# Patient Record
Sex: Male | Born: 1955 | Race: White | Hispanic: No | Smoking: Current every day smoker
Health system: Southern US, Community
[De-identification: ages and names within clinical notes are randomized; demographics above are authoritative.]

## PROBLEM LIST (undated history)

## (undated) DIAGNOSIS — T4145XA Adverse effect of unspecified anesthetic, initial encounter: Secondary | ICD-10-CM

## (undated) DIAGNOSIS — R0600 Dyspnea, unspecified: Secondary | ICD-10-CM

## (undated) DIAGNOSIS — F1020 Alcohol dependence, uncomplicated: Secondary | ICD-10-CM

## (undated) DIAGNOSIS — K219 Gastro-esophageal reflux disease without esophagitis: Secondary | ICD-10-CM

## (undated) DIAGNOSIS — IMO0001 Reserved for inherently not codable concepts without codable children: Secondary | ICD-10-CM

## (undated) DIAGNOSIS — T8859XA Other complications of anesthesia, initial encounter: Secondary | ICD-10-CM

## (undated) HISTORY — DX: Gastro-esophageal reflux disease without esophagitis: K21.9

## (undated) HISTORY — PX: BUNIONECTOMY: SHX129

---

## 2003-01-30 ENCOUNTER — Emergency Department (HOSPITAL_COMMUNITY): Admission: EM | Admit: 2003-01-30 | Discharge: 2003-01-30 | Payer: Self-pay | Admitting: Emergency Medicine

## 2003-11-14 ENCOUNTER — Emergency Department (HOSPITAL_COMMUNITY): Admission: EM | Admit: 2003-11-14 | Discharge: 2003-11-14 | Payer: Self-pay | Admitting: Emergency Medicine

## 2005-03-08 ENCOUNTER — Emergency Department (HOSPITAL_COMMUNITY): Admission: EM | Admit: 2005-03-08 | Discharge: 2005-03-09 | Payer: Self-pay | Admitting: Emergency Medicine

## 2005-03-08 ENCOUNTER — Emergency Department (HOSPITAL_COMMUNITY): Admission: EM | Admit: 2005-03-08 | Discharge: 2005-03-08 | Payer: Self-pay | Admitting: Emergency Medicine

## 2005-03-09 ENCOUNTER — Observation Stay (HOSPITAL_COMMUNITY): Admission: AD | Admit: 2005-03-09 | Discharge: 2005-03-10 | Payer: Self-pay | Admitting: Internal Medicine

## 2005-03-10 ENCOUNTER — Observation Stay (HOSPITAL_COMMUNITY): Admission: EM | Admit: 2005-03-10 | Discharge: 2005-03-12 | Payer: Self-pay | Admitting: Emergency Medicine

## 2005-11-27 ENCOUNTER — Ambulatory Visit (HOSPITAL_COMMUNITY): Admission: RE | Admit: 2005-11-27 | Discharge: 2005-11-27 | Payer: Self-pay | Admitting: Internal Medicine

## 2005-12-02 ENCOUNTER — Ambulatory Visit (HOSPITAL_COMMUNITY): Admission: RE | Admit: 2005-12-02 | Discharge: 2005-12-02 | Payer: Self-pay | Admitting: Internal Medicine

## 2006-03-05 ENCOUNTER — Emergency Department (HOSPITAL_COMMUNITY): Admission: EM | Admit: 2006-03-05 | Discharge: 2006-03-05 | Payer: Self-pay | Admitting: Emergency Medicine

## 2006-05-19 ENCOUNTER — Ambulatory Visit (HOSPITAL_COMMUNITY): Admission: RE | Admit: 2006-05-19 | Discharge: 2006-05-19 | Payer: Self-pay | Admitting: Podiatry

## 2007-03-12 ENCOUNTER — Emergency Department (HOSPITAL_COMMUNITY): Admission: EM | Admit: 2007-03-12 | Discharge: 2007-03-13 | Payer: Self-pay | Admitting: Emergency Medicine

## 2007-04-28 ENCOUNTER — Emergency Department (HOSPITAL_COMMUNITY): Admission: EM | Admit: 2007-04-28 | Discharge: 2007-04-29 | Payer: Self-pay | Admitting: Emergency Medicine

## 2008-02-15 ENCOUNTER — Inpatient Hospital Stay (HOSPITAL_COMMUNITY): Admission: EM | Admit: 2008-02-15 | Discharge: 2008-02-27 | Payer: Self-pay | Admitting: Emergency Medicine

## 2008-05-05 ENCOUNTER — Emergency Department (HOSPITAL_COMMUNITY): Admission: EM | Admit: 2008-05-05 | Discharge: 2008-05-05 | Payer: Self-pay | Admitting: Emergency Medicine

## 2010-09-21 ENCOUNTER — Encounter: Payer: Self-pay | Admitting: Neurosurgery

## 2011-01-13 NOTE — Discharge Summary (Signed)
NAME:  Douglas Stout, Douglas Stout NO.:  [id number]   MEDICAL RECORD NO.:  [id number]          PATIENT TYPE:  INP   LOCATION:  A318                          FACILITY:  [facility]   PHYSICIAN:  Kingsley Callander. Ouida Sills, MD       DATE OF BIRTH:  [birth year]   DATE OF ADMISSION:  02/15/2008  DATE OF DISCHARGE:  LH                               DISCHARGE SUMMARY   DISCHARGE DIAGNOSES:  1. Gastroenteritis.  2. Delirium/alcohol withdrawal.  3. Alcoholic hepatitis.  4. Thrombocytopenia.  5. Hyponatremia.  6. Hypomagnesemia.  7. Hypokalemia.  8. Hypocalcemia.  9. Right lower lobe pneumonia.   HOSPITAL COURSE:  This patient is a 55 year old white male who presented  with nausea, vomiting, and diarrhea.  Stool studies were obtained and  were negative.  He was febrile.  Blood cultures were negative.  He was  hypokalemic, hyponatremic, hypocalcemic, and hypomagnesemic.  He was  treated with IV fluids, supplemental potassium, and calcium.  He was  thrombocytopenic.  His platelet count normalized, however, over his  hospitalization as did his potassium and sodium levels.  He had elevated  liver enzymes consistent with alcoholic hepatitis.   He was treated with an alcohol detox regimen of thiamine, multivitamins,  magnesium, and Ativan.  He was seen in Neurology Consultation by Dr.  Gerilyn Pilgrim.  A CT of the head was a poor quality study.   He developed a right lower lobe pneumonia.  He was treated with Levaquin  and vancomycin.  There was concern that he had influenza and he was  treated with Tamiflu.   His GI symptoms had resolved.  He was treated empirically with PPI  therapy.   His mental status cleared.  He was able to eat and walk.  He was felt to  be stable for discharge on February 27, 2008.  He will be seen in followup  to my office in approximately 10 days.  He was advised to avoid all  alcohol.   DISCHARGE MEDICATIONS:  1. Prevacid 30 mg daily.  2. Augmentin 875 mg b.i.d. for 5 more days.  3. Vicodin q.4 h. p.r.n. for pain.  4. Multivitamin daily.      Kingsley Callander. Ouida Sills, MD  Electronically Signed     ROF/MEDQ  D:  02/27/2008  T:  02/27/2008  Job:  [id number]

## 2011-01-13 NOTE — Group Therapy Note (Signed)
NAME:  Douglas Stout, Douglas Stout NO.:  [id number]   MEDICAL RECORD NO.:  [id number]          PATIENT TYPE:  INP   LOCATION:  A318                          FACILITY:  [facility]   PHYSICIAN:  Kofi A. Gerilyn Pilgrim, M.D. DATE OF BIRTH:  05-23-1956   DATE OF PROCEDURE:  DATE OF DISCHARGE:                                 PROGRESS NOTE   Wife reports that he is more alert, he is less drowsy and is able to  feed himself today.   PHYSICAL EXAMINATION:  Temperature 98.1, pulse 84, respirations 22,  blood pressure 87/56.  The patient indeed is more alert and also responsive.  He is not dozing  off.  He follows commands well and speaks in simple, comprehensible  sentences.  Unfortunately, he is still grossly disoriented, essentially  only oriented to person.  He confabulates quite a bit.  No tremors are  noted.   EEG shows excessive spindling, likely due to benzodiazepine effect.   ASSESSMENT/PLAN:  Agitated, encephalopathy/delirium, likely due to  complication of alcohol.  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  We did discuss alcohol cessation with the wife and the  patient.  I will continue with the current care.      Kofi A. Gerilyn Pilgrim, M.D.  Electronically Signed     KAD/MEDQ  D:  02/23/2008  T:  02/23/2008  Job:  [id number]

## 2011-01-13 NOTE — Group Therapy Note (Signed)
NAME:  Douglas Stout, Douglas Stout NO.:  [id number]   MEDICAL RECORD NO.:  [id number]         PATIENT TYPE:  PINP   LOCATION:  A318                          FACILITY:  [facility]   PHYSICIAN:  Edward L. Juanetta Gosling, M.D.DATE OF BIRTH:  November 30, 1955   DATE OF PROCEDURE:  02/19/2008  DATE OF DISCHARGE:                                 PROGRESS NOTE   First for February 20, 2008, Mr. Radermacher is a little better.  He is still  confused.  He still agitated but not as agitated and he actually at  times he is more intelligible than he was earlier.  He has had no more  falls.  He has actually been out at the nurses' station so he is very  closely observed.   His exam shows his temperature is 97.2, pulse 91, respirations 20, blood  pressure 103/66, and O2 sat is 92% on 2 L.  His chest is clearer than it  was.  He looks more comfortable than he did, and he is a little more  alert and he is a little bit more oriented than before.   ASSESSMENT:  I think of course he has had nausea, vomiting, and diarrhea  that seems to have resolved.  He is having alcohol withdrawal delirium  tremens, and he has still got significant problem with that, but he does  seem to be slowly improving.   Plan is to continue with his treatments, continue DT protocol and  follow.      Edward L. Juanetta Gosling, M.D.  Electronically Signed     ELH/MEDQ  D:  02/20/2008  T:  02/20/2008  Job:  [id number]

## 2011-01-13 NOTE — Group Therapy Note (Signed)
NAME:  Douglas Stout, GORR NO.:  [id number]   MEDICAL RECORD NO.:  [id number]          PATIENT TYPE:  INP   LOCATION:  A318                          FACILITY:  [facility]   PHYSICIAN:  Edward L. Juanetta Gosling, M.D.DATE OF BIRTH:  02-Jul-1956   DATE OF PROCEDURE:  DATE OF DISCHARGE:                                 PROGRESS NOTE   Mr. Barish was admitted with GI symptoms but since then has developed  delirium tremens from alcohol withdrawal.  He has been quite agitated.  Last night, he spiked a temperature.  I went ahead and started him on  Tamiflu for influenza prophylaxis.  We are going to go ahead and check  influenza titer to see if he is having any problems with that.  He also,  however, had received some Reglan in an attempt to improve his GI  situation and he became agitated.  Although he has been agitated, I  suppose this may have represented something like a malignant neuroleptic  syndrome, and I have asked for neurology consultation.  He tried to get  out of the bed again last night and fell but refused the CT scan.  Neurologically, he appears to be intact now.  He is sleepy but not  agitated as he was yesterday.  His T-max was 103.1, pulse 107, and  respirations 22.  His blood pressure dropped into the 70s.  He is  receiving more IV fluids now.  His chest is relatively clear.  He has  some rhonchi.  His heart is regular.  Blood cultures were done last  night and are pending.  He had a CBC which showed a white count of  11,300 yesterday with only 30% neutrophils and 54% lymphocytes.  Metabolic profile yesterday still showed some changes with his bilirubin  being elevated.  His SGOT and SGPT being elevated but that came down  some.  His albumin was 2.2.  CT head which was done early this morning  shows chronic left maxillary sinusitis, some ethmoiditis, and no  fractures but there was a lot of motion.   ASSESSMENT:  He is still quite sick.  He is febrile.  We are treating  him for influenza.  We are treating him for pneumonia.  He had a CT.  He  had a chest x-ray done yesterday as well which showed some edema versus  infiltrates.  So, I am going to go ahead and treat him as if he has  pneumonia with Levaquin.  He is on vancomycin.  He has had blood  cultures.  I do not think we can get a sputum culture.  I am going to  increase his IV fluids.  I am going to get a consultation with the  neurology team, but he is obviously quite sick.  I have discussed this  at length with his wife today to get her up-to-date with his  situation.      Edward L. Juanetta Gosling, M.D.  Electronically Signed     ELH/MEDQ  D:  02/22/2008  T:  02/23/2008  Job:  [id number]

## 2011-01-13 NOTE — Group Therapy Note (Signed)
NAME:  Douglas Stout, Douglas Stout NO.:  [id number]   MEDICAL RECORD NO.:  [id number]           PATIENT TYPE:   LOCATION:                                 FACILITY:   PHYSICIAN:  Edward L. Juanetta Gosling, M.D.DATE OF BIRTH:  08/19/1956   DATE OF PROCEDURE:  02/20/2008  DATE OF DISCHARGE:                                 PROGRESS NOTE   Douglas Stout is a little more alert and still, seems to be doing slightly  better.  He has no new complaints.   His physical exam shows his O2 sat is 97% on room air.  His temperature  is 98.6, pulse 93, respirations 22, and blood pressure 98/59.  His heart  is regular.  His abdomen is soft.  Neurologically, he seems a little bit  better.   ASSESSMENT:  He has delirium tremens.  He has had diarrhea.  He had a  negative C. difficile titer.  The diarrhea seems to have resolved.   PLAN:  Continue with DT protocol.  I am going to go ahead and then check  labs again tomorrow.  Continue his other treatments and follow.      Edward L. Juanetta Gosling, M.D.  Electronically Signed     ELH/MEDQ  D:  02/20/2008  T:  02/20/2008  Job:  [id number]

## 2011-01-13 NOTE — Group Therapy Note (Signed)
NAME:  JONTAE, ADEBAYO NO.:  [id number]   MEDICAL RECORD NO.:  [id number]          PATIENT TYPE:  INP   LOCATION:  A318                          FACILITY:  [facility]   PHYSICIAN:  Edward L. Juanetta Gosling, M.D.DATE OF BIRTH:  05-31-1956   DATE OF PROCEDURE:  DATE OF DISCHARGE:                                 PROGRESS NOTE   Patient of Dr. Ouida Sills.  Mr. Rewerts continues improving.  He says he is  feeling better.  He ate today and I am happy with that.  He has no new  complaints.   His physical examination this morning shows his temperature is 98, pulse  is 88, respirations 20, blood pressure 107/80, O2 sats 99% on room air.  He is awake.  He is alert.  His speech is much more intelligible.  He  says he is going to stop drinking and he is not coughing much.  He has  been up a little bit, but still very weak.   His lab work shows his white count is 6600, hemoglobin is 12.6, and  platelets 455.  Electrolytes were normal.  Glucose is 106, and overall I  think he is much improved.   ASSESSMENT:  He is better.  He is still very weak, someone having try to  get up and move some, otherwise continue his treatments.  I am very  pleased with his improvements.      Edward L. Juanetta Gosling, M.D.  Electronically Signed     ELH/MEDQ  D:  02/25/2008  T:  02/25/2008  Job:  [id number]

## 2011-01-13 NOTE — Group Therapy Note (Signed)
NAME:  Douglas Stout, Douglas Stout NO.:  [id number]   MEDICAL RECORD NO.:  [id number]          PATIENT TYPE:  INP   LOCATION:  A318                          FACILITY:  [facility]   PHYSICIAN:  Edward L. Juanetta Gosling, M.D.DATE OF BIRTH:  [birth year]   DATE OF PROCEDURE:  DATE OF DISCHARGE:                                 PROGRESS NOTE   HISTORY:  Douglas Stout is much better.  He is more awake, he is more alert,  and he has not noted complaints.  He says he is getting stronger, but he  is still very weak.  He of course had a pneumonia, now well.   PHYSICAL EXAMINATION:  His vital signs are as recorded in the E-chart.  His chest is much clear.  He does look stronger.  He is more alert, he  is more oriented, and I think everything overall is better.   ASSESSMENT:  He has pneumonia, which seems to be improving.  He has  delirium treatments.  He says he is going to stop drinking.  He finally  seems to be out of that.  He is significantly weak, but he does seem to  be doing well.   PLAN:  My plan then is to continue his treatments.  Dr. Bernette Mayers will be  back in the morning.      Edward L. Juanetta Gosling, M.D.  Electronically Signed     ELH/MEDQ  D:  02/26/2008  T:  02/27/2008  Job:  [id number]

## 2011-01-13 NOTE — Group Therapy Note (Signed)
NAME:  HERB, BELTRE NO.:  [id number]   MEDICAL RECORD NO.:  [id number]          PATIENT TYPE:  INP   LOCATION:  A318                          FACILITY:  [facility]   PHYSICIAN:  Edward L. Juanetta Gosling, M.D.DATE OF BIRTH:  1955-12-29   DATE OF PROCEDURE:  DATE OF DISCHARGE:                                 PROGRESS NOTE   Ms. Pitter has been admitted with nausea, vomiting, and diarrhea.  He had  a temperature to 102 and he has been in DTs since he arrived.  He is  still very agitated and confused.  Some of his families in the room and  they say that he is muttering and mumbling, they said they cannot  understand him either.   His physical exam shows temperature is 97.6, pulse 112, respirations 20,  blood pressure 100/64, and O2 sats 93% on room air.  His chest is clear.  His heart is regular but with a tachycardia.   Stool culture no suspicious colonies.  Blood cultures on the February 16, 2008, are negative.  CBC today shows white count 7700, platelets 36,000,  and hemoglobin 14.8.  Metabolic profile shows his BUN 7, creatinine  0.89.  He still has elevation of SGOT and SGPT.  C. difficile was  negative.  CBC on admission showed his platelets were 69,000, this is  felt to be secondary to his cirrhosis.   ASSESSMENT:  He is still quite sick.  He has got significant problem  with delirium tremens.  He is still very confused.  His platelet count  is low so I am going to repeat that tomorrow.  He is not on heparin even  low-dose, so I do not think this is heparin-induced of course.  In my  plan then is to continue his current treatments.  I have explained to  his family that he is quite sick and that there is significant mortality  from this problem.      Edward L. Juanetta Gosling, M.D.  Electronically Signed     ELH/MEDQ  D:  02/18/2008  T:  02/18/2008  Job:  [id number]

## 2011-01-13 NOTE — Group Therapy Note (Signed)
NAME:  Douglas Stout, HOGG NO.:  [id number]   MEDICAL RECORD NO.:  [id number]          PATIENT TYPE:  INP   LOCATION:  A318                          FACILITY:  [facility]   PHYSICIAN:  Edward L. Juanetta Gosling, M.D.DATE OF BIRTH:  1956/07/13   DATE OF PROCEDURE:  DATE OF DISCHARGE:                                 PROGRESS NOTE   Patient of Dr. Ouida Sills.  Ms. Fines says he is better.  He still confused  but not as bad as he was yesterday and certainly not as bad as he was  proceeding that.  He has no new complaints.  He is eaten a little bit,  so I am happy with that.   His exam shows his temperature is 97.8, pulse is 92, respirations 20,  blood pressure 117/74, and O2 sats is 95% on room air.  His chest is  clearer.  His heart is regular without gallop.  His abdomen is soft.   His lab work today shows his white count is 7400, hemoglobin is 13.3,  platelets 417, glucose is 116, BUN at 2, and creatinine 0.62.   ASSESSMENT:  He has delirium tremens.  He has pneumonia.  This is  probably aspiration.  He is being treated for that, it seems to be  improving.  He has severe problems with Dts, stable, and my plan is to  continue his treatments and medications.  He does seem to be slowly  improving and I will see how he does.  No new medications today.      Edward L. Juanetta Gosling, M.D.  Electronically Signed     ELH/MEDQ  D:  02/24/2008  T:  02/24/2008  Job:  [id number]

## 2011-01-13 NOTE — Procedures (Signed)
NAME:  MARISSA, LOWREY NO.:  [id number]   MEDICAL RECORD NO.:  [id number]          PATIENT TYPE:  INP   LOCATION:  A318                          FACILITY:  [facility]   PHYSICIAN:  Kofi A. Gerilyn Pilgrim, M.D. DATE OF BIRTH:  February 26, 1956   DATE OF PROCEDURE:  DATE OF DISCHARGE:                              EEG INTERPRETATION   HISTORY:  This is a 55 year old  man who presents with lethargy,  confusion, and altered mental status.  This study has been evaluated for  possible seizures.   MEDICATIONS:  1. Protonix.  2. Vitamin B1.  3. Tamiflu.  4. Levaquin.  5. Lomotil.  6. Zofran.  7. Morphine.  8. Ativan.   ANALYSIS:  A 16-channel recording is conducted for 21 minutes. There is  a well-formed posterior rhythm of 10 Hz, which attenuates with eye  opening.  The patient does have stage II sleep with K-complexes and  spindles. The patient in fact has excess spindling noted throughout the  recording.  There is no focal or lateralized slowing.  There is no  epileptiform activity observed.   IMPRESSION:  Excess spindling, which can be seen in benzodiazepine use  and alcohol use; however, there is no epileptiform activity observed.      Kofi A. Gerilyn Pilgrim, M.D.  Electronically Signed     KAD/MEDQ  D:  02/23/2008  T:  02/24/2008  Job:  [id number]

## 2011-01-13 NOTE — H&P (Signed)
NAME:  RHYS, LICHTY NO.:  [id number]   MEDICAL RECORD NO.:  [id number]          PATIENT TYPE:  INP   LOCATION:  A318                          FACILITY:  [facility]   PHYSICIAN:  Kingsley Callander. Ouida Sills, MD       DATE OF BIRTH:  09/24/1955   DATE OF ADMISSION:  02/15/2008  DATE OF DISCHARGE:  06/29/2009LH                              HISTORY & PHYSICAL   CHIEF COMPLAINT:  Vomiting and diarrhea.   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  He denied hematemesis, melena or hematochezia.  He felt  generalized abdominal discomfort.  He had experienced fever to 102  range.  He has a history of tobacco and alcohol use.  He was  hospitalized in 2006, with acute pancreatitis.  He has a history of  gastroesophageal reflux disease and is treated with Prevacid.  He states  he last drank alcohol 4 days prior to admission.   PAST MEDICAL HISTORY:  1. Pancreatitis.  2. Hypertension.  3. Hypertriglyceridemia.  4. GERD.  5. C6-C7 fractures.  6. Tonsillectomy.   MEDICATIONS:  Prevacid 30 mg daily.   ALLERGIES:  None.   SOCIAL HISTORY:  He is a smoker and a drinker.  He does not use  recreational drugs.  Family history, his father died at 4 of cirrhosis  and a stroke.  His mother died at 48 of lung cancer.   REVIEW OF SYSTEMS:  Noncontributory.   PHYSICAL EXAM:  VITALS SIGNS:  Temperature 102.7, blood pressure 105/69,  pulse 115, respirations 20, oxygen saturation 95%.  GENERAL:  Initially alert and oriented.  HEENT: No scleral icterus.  Pharynx is moist and neck is supple with no  JVD or thyromegaly.  LUNGS:  Clear.  HEART:  Tachycardia with no murmurs.  ABDOMEN:  Mildly tender diffusely with no palpable organomegaly.  EXTREMITIES:  No clubbing or edema.  NEURO:  No focal deficit.  SKIN:  Multiple tattoos.  LYMPH NODES:  No enlargement.   LABORATORY DATA:  White  count 4.7, with 72 segs and 19 lymphs.  Hemoglobin 17.4, platelets 69,000.  Sodium 125, potassium 3.4, bicarb  25, glucose 148, BUN 8, creatinine 1.38, calcium 8.4, albumin 3.2, SGOT  118, SGPT 57, lipase 47.  The urinalysis reveals trace ketones, 30 mg  per/dL of protein, negative leukocyte esterase and negative nitrite   IMPRESSION:  1. Gastroenteritis.  Stool studies will be obtained.  He will be      treated with Zofran and IV fluids.  2. Hypokalemia.  We will replace intravenously.  3. Hyponatremia.  4. Chronic alcohol use.  We will treat with Ativan as needed,      thiamine, multivitamins and we will check a magnesium level.  5. Thrombocytopenia.      Kingsley Callander. Ouida Sills, MD  Electronically Signed     ROF/MEDQ  D:  02/17/2008  T:  02/17/2008  Job:  [id number]

## 2011-01-13 NOTE — Group Therapy Note (Signed)
NAME:  TEDRICK, PORT NO.:  [id number]   MEDICAL RECORD NO.:  [id number]          PATIENT TYPE:  INP   LOCATION:  A318                          FACILITY:  [facility]   PHYSICIAN:  Edward L. Juanetta Gosling, M.D.DATE OF BIRTH:  [birth year]   DATE OF PROCEDURE:  DATE OF DISCHARGE:                                 PROGRESS NOTE   Mr. Steeves is admitted with diarrheal illness and then developed DTs.  He  is the most responsive that I have seen him this morning, although he  still sort of mutters but at least I can understand what he is saying.  He is more alert.  He keeps asking when he is going home.  He has no  other new complaints.  He has had some pain in his left elbow.  His left  elbow was a little swollen and slightly erythematous last night.  I had  an x-ray made but it does not show anything.  He may have an area of  phlebitis.  It may even be gout or pseudogout but his uric acid level  was normal as well.  He is not coughing much.  He has had less fever.  I  think overall things are little better.  This morning, his temperature  is 97.8, pulse 78, respirations 20, blood pressure 94/62, and O2 sat is  95% on room air.  His chest is much clear.  Neurologically, he is much  better.  His heart is regular.  The elbow looks a little better as well.  His abdomen is soft.  CNS shows that he is still a little confused, a  little slow to respond, but he is responsive.  He is more alert than he  has been.  I do not plan to change anything and I will see him back here  and I will continue to follow.  Dr. Ouida Sills will return on Monday and I  think unless he continues to have remarkable improvement, he will  probably be here until then.  He is a great deal better today.      Edward L. Juanetta Gosling, M.D.  Electronically Signed     ELH/MEDQ  D:  02/23/2008  T:  02/23/2008  Job:  [id number]

## 2011-01-13 NOTE — Group Therapy Note (Signed)
NAME:  Rish, Benji                  ACCOUNT NO.:  [id number]   MEDICAL RECORD NO.:  [id number]           PATIENT TYPE:   LOCATION:                                 FACILITY:   PHYSICIAN:  Edward L. Hawkins, M.D.DATE OF BIRTH:  06/25/1956   DATE OF PROCEDURE:  02/20/2008  DATE OF DISCHARGE:                                 PROGRESS NOTE   Mr. Bovey is a little more alert and still, seems to be doing slightly  better.  He has no new complaints.   His physical exam shows his O2 sat is 97% on room air.  His temperature  is 98.6, pulse 93, respirations 22, and blood pressure 98/59.  His heart  is regular.  His abdomen is soft.  Neurologically, he seems a little bit  better.   ASSESSMENT:  He has delirium tremens.  He has had diarrhea.  He had a  negative C. difficile titer.  The diarrhea seems to have resolved.   PLAN:  Continue with DT protocol.  I am going to go ahead and then check  labs again tomorrow.  Continue his other treatments and follow.      Edward L. Hawkins, M.D.  Electronically Signed     ELH/MEDQ  D:  02/20/2008  T:  02/20/2008  Job:  [id number] 

## 2011-01-13 NOTE — Consult Note (Signed)
NAME:  Douglas Stout, Douglas Stout NO.:  [id number]   MEDICAL RECORD NO.:  [id number]          PATIENT TYPE:  INP   LOCATION:  A318                          FACILITY:  [facility]   PHYSICIAN:  Kofi A. Gerilyn Pilgrim, M.D. DATE OF BIRTH:  1955-09-08   DATE OF CONSULTATION:  DATE OF DISCHARGE:                                 CONSULTATION   REASON FOR CONSULTATION:  Confusion.   HISTORY:  This is a 55 year old man who presented to the hospital with a  4-day history of nausea, vomiting, not feeling right.  He was noted to  have fever of 102.  The patient has history of heavy alcohol use and in  fact was admitted in 2006 for acute pancreatitis.  The patient has been  hospitalized for the past 4-5 days and has been on DT protocol.  He,  however, continues to be confused and agitated.  Furthermore, he has  continued to have fevers with no clear source identified.  The wife  reports that he has swelling and pain involving the left elbow, which is  documented and verified in the physical exam.   PAST MEDICAL HISTORY:  Pancreatitis, hypertension, hyperlipidemia, GERD,  and C7 and C6 fracture.   PAST SURGICAL HISTORY:  Tonsillectomy.   ADMISSION MEDICATION:  Prilosec.   CURRENT MEDICATIONS:  1. Levaquin.  2. Multivitamin.  3. Nicotine patch.  4. Tamiflu.  5. Protonix.  6. Thiamine.  7. He was on Haldol but this has been discontinued on concerns of      possible cause of fever.  8. Ativan.   ALLERGIES:  None known.   SOCIAL HISTORY:  He does continue to smoke and use alcohol.  No  recreational drug use.  The patient reports drinking alcohol 4 days  prior to the admission.   FAMILY HISTORY:  Significant for cirrhosis of the liver, stroke and lung  cancer.   PHYSICAL EXAMINATION:  GENERAL:  Thin, malnourished appearing man.  VITAL SIGNS:  Temperature 103.2, pulse 113, blood pressure 111/69, and  respirations 24.  HEENT EVALUATION:  There is a mild scalp hematoma in the left  occipital  area.  NECK:  Supple.  EXTREMITIES:  The patient has swollen tender left elbow with significant  pain on the range of movement.  ABDOMEN:  Soft.  MENTATION:  The patient lies in bed with eyes closed.  He does open his  eyes to verbal command.  He is oriented only to person.  He does follow  commands well.  CRANIAL NERVES:  Pupils were equal, round, and reactive to light.  Extraocular movements are full.  Visual fields appears to be full.  Facial muscle strength is symmetric.  Tongue is midline.  MOTOR EXAMINATION:  Shows antigravity strength in all 4 extremities,  exact grade is difficult given the lack of cooperation.  The left upper  extremity is weak due to the left elbow pain.  Reflexes are brisk.  Coordination, there is no tremors noted.  During my examination today,  there is no rigidity, stiffness, or parkinsonism.  No dysmetria is  noted.  Head CT scan was severely degraded due to motion artifact, otherwise  nothing acute is seen.  Blood culture, no growth out of 5 days.  The  other repeat cultures are pending.  Sodium 137, potassium 4.3, chloride  103, CO2 23, BUN 2, creatinine 0.8, glucose of 74, total bilirubin 2.1,  AST 82, ALT 60, and calcium 8.3.   ASSESSMENT:  Acute encephalopathy that has persisted.  I believe that  overall clinical scenario is delirium due to delirium tremors.  The  patient also had a febrile illness with workup so for not showing any  sources.  He does have nonproductive cough.  His WBC is high 11, but he  has mostly monocytes with 54%.  Dr. Juanetta Gosling does not believe this is a  bacterial process, I agree.  I think he probably may have a viral  process and in fact may have the flu, which may contribute to his  overall encephalopathy.   RECOMMENDATIONS:  1. Recommend EEG.  2. Agree with avoidance of neuroleptics and use benzodiazepines on a      p.r.n. basis.  He continues to have nausea.  We may want to      consider erythromycin.  He  should have further evaluation done of      this painful left elbow.  MRI of the brain could be helpful, but we      will require massive sedation in this patient who is already quite      agitated.  We will follow his neurological status.   Thanks for this consultation.      Kofi A. Gerilyn Pilgrim, M.D.  Electronically Signed     KAD/MEDQ  D:  02/22/2008  T:  02/23/2008  Job:  [id number]

## 2011-01-13 NOTE — Group Therapy Note (Signed)
NAME:  GEN, CLAGG NO.:  [id number]   MEDICAL RECORD NO.:  [id number]          PATIENT TYPE:  INP   LOCATION:  A318                          FACILITY:  [facility]   PHYSICIAN:  Edward L. Juanetta Gosling, M.D.DATE OF BIRTH:  November 29, 1955   DATE OF PROCEDURE:  DATE OF DISCHARGE:                                 PROGRESS NOTE   Douglas Stout is the patient of Dr Ouida Sills.  Douglas Stout has been in Dts, but  he is more alert, although he is somewhat combative.  We had been trying  to reduce the amount of sedation to see if we can get him awake, but he  is not eating.  His exam this morning shows that he is sluggish.  He has  had some medication recently, but he is responsive.  I have explained to  him what is going on.  He says he feels nauseated and is not sure that  he can eat anything.  His O2 sats 97% on room air.  His chest is fairly  clear.  His temperature is 98.1, pulse 101, respirations 20, and blood  pressure 117/73.  He does seem more alert.   ASSESSMENT:  He has significant problems still with his nausea, still  with Dts, but he seems to be slowly progressing.  I am going to add some  Reglan. I have explained to his family in the room that we will see if  that will help reduce some of  his nausea to some extent.  He has Zofran  ordered as well.  There are of course some concerns about Reglan, but I  would like to see if we can maybe move some food through and he may be  less nauseated.  If it does not work, I will stop it.      Edward L. Juanetta Gosling, M.D.  Electronically Signed     ELH/MEDQ  D:  02/21/2008  T:  02/21/2008  Job:  [id number]

## 2011-01-16 NOTE — Group Therapy Note (Signed)
NAME:  Douglas Stout, Douglas Stout NO.:  [id number]   MEDICAL RECORD NO.:  [id number]          PATIENT TYPE:  OBV   LOCATION:  A203                          FACILITY:  [facility]   PHYSICIAN:  Kingsley Callander. Ouida Sills, MD       DATE OF BIRTH:  12-04-1955   DATE OF PROCEDURE:  DATE OF DISCHARGE:                                   PROGRESS NOTE   RE-ADMIT NOTE AND PROGRESS NOTE   Douglas Stout left the hospital yesterday against medical advice at  approximately mid-morning.  He then returned to the emergency room in mid-  afternoon with epigastric pain consistent with his pancreatitis.  He was  readmitted.  He has been treated with IV morphine.  He had left the hospital  to eat.  His pain increased after eating; he was then kept n.p.o.  Upon  meeting with him yesterday afternoon, I expressed my dissatisfaction with  his poor compliance to our prescribed medical therapy; he seems more willing  now to go along with the treatment plan.   He is not experiencing vomiting; he has a good appetite.  He has developed a  fever.   PHYSICAL EXAMINATION:  VITAL SIGNS:  Temperature 101.4, pulse 112, blood  pressure 159/94.  GENERAL:  He appears comfortable now but has received IV morphine.  HEENT:  No scleral icterus.  CHEST:  Clear.  HEART:  Regular.  ABDOMEN:  Minimally tender in the epigastrium.  No palpable organomegaly.   LABORATORY DATA:  His amylase has normalized from 256 to 113.  His lipase  has normalized from 209 to 37.  His potassium has normalized from 3.4 to  3.7.  He has a low BUN of 3.  His glucose has normalized at 83.  His repeat  white count is 9.9.  Blood cultures were obtained yesterday with his fever.   IMPRESSION:  1.  Acute pancreatitis.  Improving.  Encouraged compliance with his medical      regimen.  We will advance his diet as tolerated.  2.  Question hydronephrosis.  His CT reveals no evidence of tumor or stone.  3.  Hypokalemia, resolved.  4.  Fever.  Await culture results.   Likely related to his pancreatitis.  No      sign of bacterial infection at this point.       ROF/MEDQ  D:  03/11/2005  T:  03/11/2005  Job:  [id number]

## 2011-01-16 NOTE — Op Note (Signed)
NAME:  Douglas Stout, Douglas Stout NO.:  [id number]   MEDICAL RECORD NO.:  [id number]          PATIENT TYPE:  AMB   LOCATION:                                FACILITY:  [facility]   PHYSICIAN:  Denny Peon. Ulice Brilliant, D.P.M.  DATE OF BIRTH:  1956/02/04   DATE OF PROCEDURE:  05/19/2006  DATE OF DISCHARGE:                                 OPERATIVE REPORT   PREOPERATIVE DIAGNOSIS:  Hallux abductio valgus deformity, right foot.  Subluxed hammer digit second toe, right foot.  Long second metatarsal, right  foot.   POSTOPERATIVE DIAGNOSIS:  Hallux abductio valgus deformity, right foot.  Subluxed hammer digit second toe, right foot.  Long second metatarsal, right  foot.   OPERATION PERFORMED:  Reverdin osteotomy, first metatarsal right foot.  Shortening second metatarsal osteotomy right foot.  Second digit  arthroplasty with 0.045 K-wire fixation, right foot.   SURGEON:  Denny Peon. Ulice Brilliant, D.P.M.   ANESTHESIA:  Monitored anesthesia care.   INDICATIONS FOR PROCEDURE:  Pain in sub second metatarsal area of right foot  present since the early part of this year, has gotten progressively worse  and has not improved with anti-inflammatory medication, functional foot  orthotics, injection therapy, time out of work, rest, etc.  The patient is  clinically noted to have a painful reaction to palpation of the sub second  metatarsal space at the second metatarsal head and an uncomfortable hallux  abductio valgus deformity.  The second digit is basically subluxed and is  deviated dorsally.  Radiographic analysis reveals a residual metatarsus  abductus foot type and a subluxed hammer digit at the second toe.   DESCRIPTION OF PROCEDURE:  Mr. Douglas Stout is brought into the operating room and  placed on the table in the supine position.  IV sedation established.  A  Mayo block was performed about the right first MTP.  Further local  anesthesia was administered between the second and third metatarsals and  over the  dorsal aspect of food.  A pneumatic ankle tourniquet was applied  over his right ankle over appropriate cast padding.  His foot was then  prepped and draped in the usual aseptic fashion.  An Ace bandage was  utilized to exsanguinate his foot and the tourniquet was inflated to 250  mmHg.   Procedure #1:  Reverdin bunionectomy first metatarsal of right foot.  Attention was direct to this first MTP of the right foot.  An 8 cm  curvilinear skin incision is made paralleling the extensor hallucis longus  tendon overlying the first metatarsophalangeal joint.  The incision was  deepened by sharp and blunt dissection.  Care was taken to get good exposure  of the medial aspect of the joint capsule.  An inverted L capsulotomy was  then performed.  The capsule and periosteal tissues were reflected away from  the underlying bony surface.  The medial eminence was then resected  utilizing the oscillating saw with #63 blade.  Attention was then directed  into the first interspace through the original skin incision.  A Weitlaner  retractor was utilized to aid  in exposure.  Metzenbaum scissors were up-to-  date to dissect down to the level of the fibular sesamoid and adductor  hallucis tenotomy was then performed.  The fibular sesamoid was removed of  all soft tissue adherence and excised.  Attention was directed back to the  first metatarsal head.  A 0.045 K-wire was introduced and utilized to act as  a pin axis guide.  The osteotomy cuts for the Reverdin bunionectomy were  then made.  The first cut is the plantar cut.  The second is the dorsal cut,  paralleling the articular surface.  The third cut was then made which was  oblique to the second creating a wedge of bone dorsally with the base of  this being medial and the apex lateral.  This section of bone was removed.  The capital fragment was then relocated approximately  30 to 40% across the  width of the metatarsal surface and the osteotomy was also  closed down upon  itself tilting the articular surface of the first metatarsal in a more  forward facing fashion versus being laterally deviated.  This osteotomy was  then fixated by two 0.045 K-wires driven in a slightly divergent fashion.  The osteotomy was deemed stable.  The redundant bone laterally is removed  with oscillating saw and then rasped smooth with a pneumatic rasp.  The K-  wires were bent close to the metatarsal surface, cut and then rotated.  A  fairly aggressive capsule reefing in the first metatarsal joint medially was  then performed an approximately 4 to 5 mm wedge of redundant medial joint  capsule was excised.  The capsular tissues were then reapproximated and  closed with a combination of simple and interrupted horizontal sutures of 3-  0  Vicryl.  Subcutaneous tissues were reapproximated and closed with 4-0  Vicryl.  Skin is closed with 4-0 Vicryl in a subcuticular suture.  The  forefoot was then loaded and the toe was noted to be significantly more  rectus as compared to preoperatively.   Procedure #2 and #3:  Shortening second metatarsal osteotomy with second  digit arthroplasty with 0.045 K-wire fixation.  Attention was then directed  to the second ray as a unit.  An 8 cm slightly curvilinear  skin incision  was made beginning at the midshaft of the intermediate phalanx crossing over  the proximal interphalangeal joint and then crossing a slightly curvilinear  fashion over the metatarsophalangeal joint and then extending up the distal  one half the metatarsal in a relatively rectus fashion.  The incision was  deepened by sharp and blunt dissection through subcutaneous tissues.  Any  crossing vessels that could not be retracted were clamped and cauterized.  Care was taken to get good exposure of the proximal interphalangeal joint,  the extensor hood apparatus, the metatarsophalangeal joint and the distal aspect of the second metatarsal.  The proximal  interphalangeal joint was  entered by dorsal transverse excision across the extensor tendon.  The  medial and lateral collateral ligaments were isolated and severed.  An  extensor hood release was then performed.  The long extensor tendon to the  second MTP is undermined and retracted laterally.  A transverse  metatarsophalangeal joint capsulotomy was then performed.  The McGlamry  elevator was introduced and utilized to completely free up the flexor plate  plantarly.  Capsular tissues were then reflected away from the surgical neck  in the metatarsal.  The shortening osteotomy was performed first with the  oscillating  saw and then completed with the sagittal saw.  The amount of  shortening via this dorsal distal to plantar proximal oblique osteotomy is  approximately 3 to 4 mm as measured preoperatively to postoperatively.  The  osteotomy was then fixated with a 14 mm 2.0 osteomed screw.  The  arthroplasty of the second proximal interphalangeal joint was then performed  utilizing resection of the proximal phalangeal head with the oscillating  saw.  A 0.045 K-wire was then driven through the distal aspect of the toe  from the base of the intermediate phalanx.  The K-wire was then retrograded  back across the proximal interphalangeal joint and then with the toe held  essentially paralleling the third digit and as rectus as possible, the K-  wire was retrograded back across the MTP into the second metatarsal head and  shaft.  Upon looking at this on the Xi-scan it is noted that the toe is  still lying in a position of slight lateral deviation; however, to get the  toe to lie in a position of exactly rectus with the second metatarsal would  be going against the natural curvature of this patient's residual metatarsus  abductus foot type.  The goal of this procedure is to unload the plantar  pressure surfaces at the second MTP not essentially to create a rectus  second metatarsophalangeal in  horizontal plane visualization.  The K-wire  was then bent close to the second digit distally.  The wound was flushed  with copious amounts of irrigant.  Capsular tissues were reapproximated and  closed over the second MTP with 4-0 Vicryl.  Subcutaneous tissues were  reapproximated and closed to the joint utilizing 4-0 Vicryl.  The skin was  then closed along the entire length of the incision with a running  interlocking suture of 4-0 Prolene.  A postoperative injection of Hexadrol  and Marcaine was dispensed.  A Betadine soaked Adaptic dressing was applied  across both incisions.  A dry sterile compressive dressing followed.  The  tourniquet was deflated with tourniquet time spanning roughly 115 minutes.   Mr. Beeks tolerated the anesthesia and procedure well.  He was transported  to day hospital without incident.  While there, a list of written  instruction orally explained to him.  Prior to being transported to the day hospital, a size medium Cam walker was dispensed.  I have instructed Mr.  Gulley that his most pressure issue is to keep the foot up and elevated above  the level of his hips to dorsiflex and plantar flex his ankle several times  per hour, wear the Cam walker only when he has to get up and go to the rest  room.  The only walking he needs to do for the next three to four days is to  do exactly that.  He is instructed on staying off this foot.  He is  prescribed Percocet 10/650 dispense 30, 1 by mouth every four to six hours  for postoperative pain, Phenergan 25 mg dispense 30, 1 by mouth every four  to six hours for postoperative nausea.  He will be seen on his initial  postoperative visit by Oley Balm. Pricilla Holm, D.P.M. next Thursday as I will be  away from the office next week.  He is instructed on that.  I will be in  town until Sunday. If he has questions he is to call me.           ______________________________  Denny Peon. Ulice Brilliant, D.P.M.  CMD/MEDQ  D:  05/19/2006  T:   05/20/2006  Job:  [id number]

## 2011-01-16 NOTE — H&P (Signed)
NAME:  Douglas Stout, RABY NO.:  [id number]   MEDICAL RECORD NO.:  [id number]          PATIENT TYPE:  AMB   LOCATION:                                FACILITY:  [facility]   PHYSICIAN:  Denny Peon. Ulice Brilliant, D.P.M.  DATE OF BIRTH:  1956/03/30   DATE OF ADMISSION:  05/19/2006  DATE OF DISCHARGE:  LH                                HISTORY & PHYSICAL   HISTORY OF PRESENT ILLNESS:  Damain has significant and debilitating pain on  the right foot about the bunion area and to a greater area under the sub-  second metatarsal area of the right foot.  This began bothering him this  year.  He was referred by Dr. Ouida Sills.  His course of therapy has consisted of  nonsteroidal anti-inflammatory medication, injection therapy, orthotic  device, all of which has offered only mild relief.  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   Deylan's past medical history is significant for reflux, history of gouty  arthritis.  He is a heavy smoker, smokes 2 packs per day.  Relates he very  rarely to basically abstains from alcohol use.  He has led a relatively  colorful lifestyle in the past in terms of the biker existence and all that  entails.   OBJECTIVE:  GENERAL:  Ean is a friendly 55 year old white male, relatively  slightly built.  VITAL SIGNS:  5 feet 2 inches, weighs 160 pounds.  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   Radiographs are taken and he is noted to have a residual metatarsus abductus  foot type, a subluxed hallux, an almost dislocated second digit, noted to  have a long second metatarsal.  The second digit is also significantly  hammered.   ASSESSMENT:  Painful hallux abductovalgus deformity, painful  sub-second  metatarsal area secondary to long second metatarsal and dislocated second  digit.   PLAN:  I have discussed with Maxemiliano that he has basically failed conservative  measures as to what I could do for him.  Surgically he has two options:  One  is to try to correct the deformity, which is correct the HAV, shorten the  second metatarsal and get the second digit down in place along with  arthroplasty, and I think this is his best course of therapy.  The second  way of treating this would be to amputate the second digit, which still may  not get rid of the sub-2 pain and will likely cause continued and further  worsening of the HAV deformity.  We have discussed this at relatively long  length on his consent signing May 05, 2006.  He would like to go ahead  and get the procedure to correct the foot, and I am in agreement with that.  We will do this tomorrow under monitored anesthesia care at [facility].  This will consist of a shortening second metatarsal osteotomy, a  second digit arthroplasty  with 0.045 K-wire fixation, and likely a Reverdin-type capital-type  osteotomy of the first metatarsal to cut down the IM angle, rotate the PASA,  and shorten the first metatarsal.  Tedford has read the consent form,  apparently understood, and signed.                                            ______________________________  Denny Peon. Ulice Brilliant, D.P.M.     CMD/MEDQ  D:  05/18/2006  T:  05/18/2006  Job:  [id number]

## 2011-01-16 NOTE — H&P (Signed)
NAME:  Douglas Stout, Douglas Stout NO.:  [id number]   MEDICAL RECORD NO.:  [id number]          PATIENT TYPE:  INP   LOCATION:  A307                          FACILITY:  [facility]   PHYSICIAN:  Kingsley Callander. Ouida Sills, MD       DATE OF BIRTH:  14-Jan-1956   DATE OF ADMISSION:  03/09/2005  DATE OF DISCHARGE:  LH                                HISTORY & PHYSICAL   CHIEF COMPLAINT:  Abdominal pain.   HISTORY OF PRESENT ILLNESS:  This patient is a 55 year old white male who  presented to the office complaining of upper abdominal pain.  He had vomited  once.  He had been treated in both the [facility] ER and the [facility] ER  the night before.  He had been evaluated then for chest pain.  He had left  against medical advice.  He had been intoxicated with an elevated alcohol  level.  At the time of my evaluation his primary complaint was epigastric  pain.  He was tender in the epigastric area and was felt to likely have  acute pancreatitis.  He had consumed six beers the day before.  He drinks  alcohol on a regular basis.  He was therefore hospitalized for further  evaluation and treatment.  He has not experienced hematemesis.  He has a  history of GERD but has recently been off Prevacid.   PAST MEDICAL HISTORY:  1.  Hypertension.  2.  Hypertriglyceridemia.  3.  GERD.  4.  C6-C7 fractures.  5.  Tonsillectomy.  6.  Hyperglycemia.   CURRENT MEDICATIONS:  1.  Prevacid 30 mg daily.  2.  Lisinopril 40 mg daily.   ALLERGIES:  None.   SOCIAL HISTORY:  He smokes cigarettes.  He drinks alcohol.  He does not use  recreational drugs.   FAMILY HISTORY:  His father died at 61 of cirrhosis and a stroke.  His  mother died at 49 of lung cancer.   REVIEW OF SYSTEMS:  His pain in his chest has resolved.  His EKG had  revealed no evidence of infarct.  He denies diarrhea or difficulty voiding.   PHYSICAL EXAMINATION:  VITAL SIGNS:  Temperature 97, pulse 112, respirations  20, blood pressure 134/88.  GENERAL:  Alert, uncomfortable-appearing white male.  SKIN:  Multiple tattoos.  HEENT:  No scleral icterus.  The pharynx is unremarkable.  NECK:  No JVD or thyromegaly.  LUNGS:  Clear.  HEART:  Tachycardic with no murmurs.  ABDOMEN:  Tender in the epigastrium.  No palpable mass or  hepatosplenomegaly.  EXTREMITIES:  No cyanosis, clubbing or edema.  NEUROLOGIC:  At baseline.  Lymph nodes no enlargement.   LABORATORY DATA:  Hemoglobin 14.8, white count 7.4, platelets 245.  Sodium  134, potassium 3.4, glucose 150, BUN 3, creatinine 0.6, SGOT 29, SGPT 20,  magnesium 1.8, amylase 256, lipase 209, alcohol 275.   IMPRESSION:  1.  acute pancreatitis.  Will treat with bowel rest.  IV morphine and IV      Phenergan as needed.  Will obtain an ultrasound of the  abdomen.  2.  Alcohol abuse.  Will treat with a detox regimen of thiamine,      multivitamins, folic acid and Ativan.  3.  Hypokalemia.  Will supplement him intravenously.  4.  Hyperglycemia.  Will follow.       ROF/MEDQ  D:  03/10/2005  T:  03/10/2005  Job:  [id number]

## 2011-01-16 NOTE — Discharge Summary (Signed)
NAME:  Douglas Stout, FLYTHE NO.:  [id number]   MEDICAL RECORD NO.:  [id number]          PATIENT TYPE:  OBV   LOCATION:  A203                          FACILITY:  [facility]   PHYSICIAN:  Kingsley Callander. Ouida Sills, MD       DATE OF BIRTH:  04-12-1956   DATE OF ADMISSION:  03/10/2005  DATE OF DISCHARGE:  LH                                 DISCHARGE SUMMARY   DISCHARGE DIAGNOSES:  1.  Acute pancreatitis  2.  Chronic alcohol abuse   HOSPITAL COURSE:  This patient is a 55 year old male who presented with  severe epigastric pain. He had initially been admitted one day prior but had  left against medical advice only to return five hours later with recurrent  pain after eating. He was rehospitalized, treated with bowel rest, IV  fluids, IV morphine and IV Protonix   His amylase normalized to 113. His lipase normalized to 37. His diet was  advanced. He was able to tolerate this. He had some residual pain. He also  experienced low grade fever. Blood cultures were negative. White count was  9.9. Chest x-ray had revealed no infiltrate. His calcium was 8.0   He was also treated with a detox regimen of thiamine, multivitamins and  Ativan.   Due abdominal discomfort after normalization of his pancreatic enzymes,  consideration was given to a GI consultation and possible upper endoscopy to  exclude a peptic ulcer. The patient was adamant about not undergoing upper  endoscopy. Therefore, he will be treated empirically with Protonix orally.   He was improved and stable for discharge on July 13. He will be seen in  follow-up in my office in two weeks. He was encouraged to avoid all alcohol  consumption.   DISCHARGE MEDICATIONS:  1.  Protonix 40 mg q.d.  2.  Multivitamin q.d.       ROF/MEDQ  D:  03/12/2005  T:  03/12/2005  Job:  [id number]

## 2011-05-28 LAB — DIFFERENTIAL
Basophils Absolute: 0
Basophils Absolute: 0.1
Basophils Absolute: 0.1
Basophils Absolute: 0.1
Basophils Absolute: 0.1
Basophils Relative: 1
Basophils Relative: 1
Eosinophils Absolute: 0
Eosinophils Absolute: 0
Eosinophils Absolute: 0
Eosinophils Absolute: 0
Eosinophils Absolute: 0.1
Eosinophils Relative: 0
Eosinophils Relative: 0
Eosinophils Relative: 1
Eosinophils Relative: 1
Lymphocytes Relative: 19
Lymphocytes Relative: 38
Lymphocytes Relative: 48 — ABNORMAL HIGH
Lymphs Abs: 2.8
Lymphs Abs: 3.1
Lymphs Abs: 3.7
Lymphs Abs: 6.1 — ABNORMAL HIGH
Monocytes Absolute: 0.4
Monocytes Absolute: 0.5
Monocytes Absolute: 0.8
Monocytes Absolute: 0.9
Monocytes Absolute: 1.6 — ABNORMAL HIGH
Monocytes Relative: 13 — ABNORMAL HIGH
Neutro Abs: 3.1
Neutro Abs: 3.5
Neutrophils Relative %: 58

## 2011-05-28 LAB — CLOSTRIDIUM DIFFICILE EIA: C difficile Toxins A+B, EIA: NEGATIVE

## 2011-05-28 LAB — COMPREHENSIVE METABOLIC PANEL
ALT: 57 — ABNORMAL HIGH
ALT: 60 — ABNORMAL HIGH
ALT: 89 — ABNORMAL HIGH
AST: 118 — ABNORMAL HIGH
AST: 82 — ABNORMAL HIGH
Albumin: 3.2 — ABNORMAL LOW
Alkaline Phosphatase: 66
BUN: 7
CO2: 22
CO2: 23
CO2: 25
Calcium: 7.3 — ABNORMAL LOW
Calcium: 8.3 — ABNORMAL LOW
Chloride: 103
Chloride: 91 — ABNORMAL LOW
Creatinine, Ser: 1.38
GFR calc Af Amer: >60
GFR calc Af Amer: >60
GFR calc non Af Amer: 54 — ABNORMAL LOW
GFR calc non Af Amer: >60
GFR calc non Af Amer: >60
Glucose, Bld: 85
Potassium: 3.4 — ABNORMAL LOW
Sodium: 125 — ABNORMAL LOW
Sodium: 132 — ABNORMAL LOW
Sodium: 137
Total Bilirubin: 0.8
Total Bilirubin: 2.1 — ABNORMAL HIGH

## 2011-05-28 LAB — BASIC METABOLIC PANEL
BUN: 4 — ABNORMAL LOW
CO2: 19
Calcium: 6.8 — ABNORMAL LOW
Calcium: 7.8 — ABNORMAL LOW
Chloride: 103
Chloride: 105
Chloride: 106
Chloride: 112
GFR calc Af Amer: >60
GFR calc Af Amer: >60
GFR calc non Af Amer: >60
GFR calc non Af Amer: >60
GFR calc non Af Amer: >60
Glucose, Bld: 106 — ABNORMAL HIGH
Potassium: 3.3 — ABNORMAL LOW
Potassium: 4
Potassium: 4.1
Potassium: 4.2
Potassium: 4.3
Sodium: 127 — ABNORMAL LOW
Sodium: 134 — ABNORMAL LOW
Sodium: 137
Sodium: 141

## 2011-05-28 LAB — URINE CULTURE: Special Requests: NEGATIVE

## 2011-05-28 LAB — LIPASE, BLOOD: Lipase: 47

## 2011-05-28 LAB — CBC
HCT: 35.4 — ABNORMAL LOW
HCT: 37.3 — ABNORMAL LOW
HCT: 37.4 — ABNORMAL LOW
HCT: 41.8
Hemoglobin: 12.6 — ABNORMAL LOW
Hemoglobin: 13.3
Hemoglobin: 14.8
MCHC: 35.4
MCV: 94.9
MCV: 95.5
MCV: 95.9
MCV: 96.5
Platelets: 355
Platelets: 45 — CL
Platelets: 69 — ABNORMAL LOW
RBC: 3.74 — ABNORMAL LOW
RBC: 3.87 — ABNORMAL LOW
RBC: 4.33
RBC: 5.1
RDW: 14
RDW: 14.1
RDW: 14.2
WBC: 11.3 — ABNORMAL HIGH
WBC: 12.8 — ABNORMAL HIGH
WBC: 4.7
WBC: 7.4

## 2011-05-28 LAB — URINE MICROSCOPIC-ADD ON

## 2011-05-28 LAB — URINALYSIS, ROUTINE W REFLEX MICROSCOPIC
Hgb urine dipstick: NEGATIVE
Nitrite: NEGATIVE
Specific Gravity, Urine: 1.015
Urobilinogen, UA: 2 — ABNORMAL HIGH

## 2011-05-28 LAB — FECAL LACTOFERRIN, QUANT: Fecal Lactoferrin: NEGATIVE

## 2011-05-28 LAB — CULTURE, BLOOD (ROUTINE X 2)
Culture: NO GROWTH
Report Status: 6282009
Report Status: 6282009

## 2011-05-28 LAB — MAGNESIUM: Magnesium: 1.3 — ABNORMAL LOW

## 2011-05-28 LAB — INFLUENZA A & B ANTIBODIES
Influenza A Virus Ab, IgG: 0.45 IV
Influenza B Virus Ab, IgM: 0.03 IV
Influenza B ab, IgG: 0.24 IV

## 2011-05-28 LAB — URIC ACID: Uric Acid, Serum: 4.2

## 2011-06-03 LAB — BASIC METABOLIC PANEL
BUN: 3 — ABNORMAL LOW
CO2: 26
Chloride: 102
Creatinine, Ser: 0.73

## 2011-06-03 LAB — URINALYSIS, ROUTINE W REFLEX MICROSCOPIC
Bilirubin Urine: NEGATIVE
Hgb urine dipstick: NEGATIVE
Protein, ur: NEGATIVE
Specific Gravity, Urine: <1.005 — ABNORMAL LOW
Urobilinogen, UA: 0.2

## 2011-06-03 LAB — PROTIME-INR
INR: 0.9
Prothrombin Time: 12.7

## 2011-06-03 LAB — DIFFERENTIAL
Basophils Absolute: 0.2 — ABNORMAL HIGH
Basophils Relative: 2 — ABNORMAL HIGH
Eosinophils Absolute: 0.1
Neutrophils Relative %: 67

## 2011-06-03 LAB — CBC
Hemoglobin: 17.9 — ABNORMAL HIGH
MCHC: 33.8
MCV: 96
Platelets: 204
WBC: 10.3

## 2011-06-16 LAB — CBC
HCT: 40.7
Hemoglobin: 14.3
MCV: 93.3
Platelets: 251
RBC: 4.36
WBC: 8.6

## 2011-06-16 LAB — URINALYSIS, ROUTINE W REFLEX MICROSCOPIC
Glucose, UA: NEGATIVE
Hgb urine dipstick: NEGATIVE
Protein, ur: NEGATIVE

## 2011-06-16 LAB — BASIC METABOLIC PANEL
BUN: 4 — ABNORMAL LOW
Chloride: 103
Potassium: 2.9 — ABNORMAL LOW

## 2011-06-16 LAB — DIFFERENTIAL
Eosinophils Absolute: 0.1
Eosinophils Relative: 1
Lymphs Abs: 2.9
Monocytes Absolute: 0.5
Monocytes Relative: 6

## 2011-06-16 LAB — ETHANOL: Alcohol, Ethyl (B): 176 — ABNORMAL HIGH

## 2011-09-07 ENCOUNTER — Encounter: Payer: Self-pay | Admitting: *Deleted

## 2011-09-07 ENCOUNTER — Other Ambulatory Visit: Payer: Self-pay

## 2011-09-07 ENCOUNTER — Emergency Department (HOSPITAL_COMMUNITY): Payer: BC Managed Care – PPO

## 2011-09-07 ENCOUNTER — Emergency Department (HOSPITAL_COMMUNITY)
Admission: EM | Admit: 2011-09-07 | Discharge: 2011-09-07 | Disposition: A | Discharge status: Home / Self Care | Payer: BC Managed Care – PPO | Attending: Emergency Medicine | Admitting: Emergency Medicine

## 2011-09-07 DIAGNOSIS — W19XXXA Unspecified fall, initial encounter: Secondary | ICD-10-CM

## 2011-09-07 DIAGNOSIS — F10929 Alcohol use, unspecified with intoxication, unspecified: Secondary | ICD-10-CM

## 2011-09-07 DIAGNOSIS — R51 Headache: Secondary | ICD-10-CM | POA: Insufficient documentation

## 2011-09-07 DIAGNOSIS — W11XXXA Fall on and from ladder, initial encounter: Secondary | ICD-10-CM | POA: Insufficient documentation

## 2011-09-07 DIAGNOSIS — R110 Nausea: Secondary | ICD-10-CM | POA: Insufficient documentation

## 2011-09-07 DIAGNOSIS — R071 Chest pain on breathing: Secondary | ICD-10-CM | POA: Insufficient documentation

## 2011-09-07 DIAGNOSIS — M542 Cervicalgia: Secondary | ICD-10-CM | POA: Insufficient documentation

## 2011-09-07 DIAGNOSIS — R404 Transient alteration of awareness: Secondary | ICD-10-CM | POA: Insufficient documentation

## 2011-09-07 DIAGNOSIS — F17200 Nicotine dependence, unspecified, uncomplicated: Secondary | ICD-10-CM | POA: Insufficient documentation

## 2011-09-07 DIAGNOSIS — R109 Unspecified abdominal pain: Secondary | ICD-10-CM | POA: Insufficient documentation

## 2011-09-07 DIAGNOSIS — R0789 Other chest pain: Secondary | ICD-10-CM

## 2011-09-07 DIAGNOSIS — F1010 Alcohol abuse, uncomplicated: Secondary | ICD-10-CM | POA: Insufficient documentation

## 2011-09-07 LAB — CBC
HCT: 41.4 % (ref 39.0–52.0)
MCH: 34 pg (ref 26.0–34.0)
MCV: 95.8 fL (ref 78.0–100.0)
RBC: 4.32 MIL/uL (ref 4.22–5.81)
WBC: 8.5 10*3/uL (ref 4.0–10.5)

## 2011-09-07 LAB — BASIC METABOLIC PANEL
BUN: 3 mg/dL — ABNORMAL LOW (ref 6–23)
CO2: 26 mEq/L (ref 19–32)
Chloride: 99 mEq/L (ref 96–112)
Creatinine, Ser: 0.59 mg/dL (ref 0.50–1.35)
Glucose, Bld: 114 mg/dL — ABNORMAL HIGH (ref 70–99)

## 2011-09-07 MED ORDER — HYDROMORPHONE HCL PF 1 MG/ML IJ SOLN
1.0000 mg | Freq: Once | INTRAMUSCULAR | Status: AC
  Administered 2011-09-07: 1 mg via INTRAVENOUS
  Filled 2011-09-07 (×2): qty 1

## 2011-09-07 MED ORDER — HYDROCODONE-ACETAMINOPHEN 5-325 MG PO TABS
1.0000 | ORAL_TABLET | Freq: Four times a day (QID) | ORAL | Status: AC | PRN
Start: 2011-09-07 — End: 2011-09-17

## 2011-09-07 MED ORDER — HYDROMORPHONE HCL PF 1 MG/ML IJ SOLN
1.0000 mg | Freq: Once | INTRAMUSCULAR | Status: AC
  Administered 2011-09-07: 1 mg via INTRAVENOUS
  Filled 2011-09-07: qty 1

## 2011-09-07 MED ORDER — SODIUM CHLORIDE 0.9 % IV SOLN
INTRAVENOUS | Status: DC
  Administered 2011-09-07 (×2): via INTRAVENOUS

## 2011-09-07 MED ORDER — ONDANSETRON HCL 4 MG/2ML IJ SOLN
4.0000 mg | Freq: Once | INTRAMUSCULAR | Status: AC
  Administered 2011-09-07: 4 mg via INTRAVENOUS
  Filled 2011-09-07: qty 2

## 2011-09-07 MED ORDER — SODIUM CHLORIDE 0.9 % IV BOLUS (SEPSIS)
250.0000 mL | Freq: Once | INTRAVENOUS | Status: AC
  Administered 2011-09-07: 1000 mL via INTRAVENOUS

## 2011-09-07 MED ORDER — IOHEXOL 300 MG/ML  SOLN
100.0000 mL | Freq: Once | INTRAMUSCULAR | Status: AC | PRN
  Administered 2011-09-07: 100 mL via INTRAVENOUS

## 2011-09-07 NOTE — ED Notes (Signed)
Pt states that he was standing on a ladder this morning cleaning gutters when he fell approx 20 feet. Pt states he landed on porch railing hitting his chest. Pt states injury occurred at approx 8:30 this a.m.

## 2011-09-07 NOTE — ED Notes (Signed)
Family member approached nurses station reporting that pt was in "a lot of pain and says he is going to leave." Upon entering room pt found sitting on the side of the bed removing blood pressure cuff and oxygen. Pt states "my chest is hurting. It's getting worse. I'm not going to sit here all night and hurt." MD informed and new orders received from Dilaudid 1mg  iv. Medication given as ordered. Pt voices no further needs at this time.

## 2011-09-07 NOTE — ED Notes (Signed)
Larey Seat off a ladder this am and hit chest against railing, pain in mid chest

## 2011-09-07 NOTE — ED Provider Notes (Signed)
History     CSN: [id number]  Arrival date & time 09/07/11  7829   First MD Initiated Contact with Patient 09/07/11 1846      Chief Complaint  Patient presents with  . Chest Pain    (Consider location/radiation/quality/duration/timing/severity/associated sxs/prior treatment) Patient is a 56 y.o. male presenting with fall.  Fall The accident occurred 6 to 12 hours ago. The fall occurred from a ladder and from a roof. He fell from a height of 11 to 15 ft. Impact surface: Landed on not railing. There was no blood loss. The point of impact was the head (Chest and abdomen). The pain is present in the head (Chest and abd.). The pain is at a severity of 10/10. The pain is moderate. He was ambulatory at the scene. There was alcohol use involved in the accident. Associated symptoms include abdominal pain, nausea, headaches and loss of consciousness. Pertinent negatives include no visual change, no fever, no numbness, no vomiting and no hematuria. He has tried nothing for the symptoms.    History reviewed. No pertinent past medical history.  History reviewed. No pertinent past surgical history.  No family history on file.  History  Substance Use Topics  . Smoking status: Current Everyday Smoker -- 2.0 packs/day  . Smokeless tobacco: Not on file  . Alcohol Use: Yes      Review of Systems  Constitutional: Negative for fever.  HENT: Negative for nosebleeds, congestion and neck pain.   Eyes: Negative for pain.  Respiratory: Positive for shortness of breath. Negative for cough.   Cardiovascular: Positive for chest pain. Negative for leg swelling.  Gastrointestinal: Positive for nausea and abdominal pain. Negative for vomiting.  Genitourinary: Negative for hematuria.  Musculoskeletal: Negative for back pain.  Skin: Negative for rash.  Neurological: Positive for loss of consciousness and headaches. Negative for numbness.  Hematological: Does not bruise/bleed easily.    Allergies    Review of patient's allergies indicates no known allergies.  Home Medications   Current Outpatient Rx  Name Route Sig Dispense Refill  . HYDROCODONE-ACETAMINOPHEN 5-325 MG PO TABS Oral Take 1-2 tablets by mouth every 6 (six) hours as needed for pain. 14 tablet 0    BP 126/73  Pulse 96  Temp(Src) 97.8 F (36.6 C) (Oral)  Resp 20  SpO2 98%  Physical Exam  Nursing note and vitals reviewed. Constitutional: He is oriented to person, place, and time. He appears well-developed and well-nourished. He appears distressed.  HENT:  Head: Normocephalic and atraumatic.  Mouth/Throat: Oropharynx is clear and moist.  Eyes: Conjunctivae and EOM are normal. Pupils are equal, round, and reactive to light.  Neck: Normal range of motion. Neck supple. No tracheal deviation present.  Cardiovascular: Normal rate, regular rhythm, normal heart sounds and intact distal pulses.   No murmur heard. Pulmonary/Chest: Effort normal and breath sounds normal. He exhibits tenderness.  Abdominal: Soft. Bowel sounds are normal. There is tenderness. There is no guarding.  Musculoskeletal: Normal range of motion. He exhibits no edema and no tenderness.  Neurological: He is alert and oriented to person, place, and time. No cranial nerve deficit. He exhibits normal muscle tone. Coordination normal.  Skin: Skin is warm. No rash noted.    ED Course  Procedures (including critical care time)   Date: 09/07/2011  Rate: 91  Rhythm: normal sinus rhythm  QRS Axis: normal  Intervals: normal  ST/T Wave abnormalities: normal  Conduction Disutrbances:none  Narrative Interpretation:   Old EKG Reviewed: none available   Labs  Reviewed  BASIC METABOLIC PANEL - Abnormal; Notable for the following:    Potassium 3.2 (*)    Glucose, Bld 114 (*)    BUN 3 (*)    All other components within normal limits  ETHANOL - Abnormal; Notable for the following:    Alcohol, Ethyl (B) 258 (*)    All other components within normal  limits  CBC   Ct Head Wo Contrast  09/07/2011  *RADIOLOGY REPORT*  Clinical Data:  Fall from ladder.  Head and posterior neck pain. The  CT HEAD WITHOUT CONTRAST CT CERVICAL SPINE WITHOUT CONTRAST  Technique:  Multidetector CT imaging of the head and cervical spine was performed following the standard protocol without intravenous contrast.  Multiplanar CT image reconstructions of the cervical spine were also generated.  Comparison:  Head CT 02/22/2008  CT HEAD  Findings: No intracranial hemorrhage.  No parenchymal contusion. No midline shift or mass effect.  Generalized cortical atrophy.  No evidence of skull base fracture.  No fluid the paranasal sinuses suggest trauma.  There is mucosal thickening within the right maxillary sinus.  Frontal sinuses are opacified on the right which also appears chronic.  Orbits appear normal.  IMPRESSION:  1.  No evidence of acute intracranial trauma. 2.  Mild atrophy. 3.  Chronic right maxillary and right frontal sinus inflammation.  CT CERVICAL SPINE  Findings: There is no prevertebral soft tissue swelling.  Normal alignment cervical vertebral bodies.  There is endplate spurring at C6-C7.  Normal facet articulation.  Normal craniocervical junction. No evidence epidural paraspinal hematoma.  IMPRESSION:  1.  No evidence of cervical spine fracture. 2.  Mild spondylosis.  Original Report Authenticated By: Genevive Bi, M.D.   Ct Chest W Contrast  09/07/2011  *RADIOLOGY REPORT*  Clinical Data:  Fall off of a ladder.  Chest pain.  CT CHEST, ABDOMEN AND PELVIS WITH CONTRAST  Technique:  Multidetector CT imaging of the chest, abdomen and pelvis was performed following the standard protocol during bolus administration of intravenous contrast.  Contrast: OMNIPAQUE IOHEXOL 300 MG/ML IV SOLN  Comparison:  CT chest 03/12/2007 and CT abdomen pelvis 05/05/2008  CT CHEST  Findings:  Coronary artery atherosclerosis.  Normal heart size. Negative for pleural or pericardial effusion or  lymphadenopathy. Thyroid gland unremarkable.  Axillary regions normal.  Soft tissues of the chest wall are symmetric.  No evidence of chest wall hematoma or stranding.  The trachea mainstem bronchi are patent.  A 3 mm right middle lobe nodule is unchanged compared to the CT of 2008, and can be considered benign.  There is minimal dependent atelectasis in the lower lobes.  Slight pleuroparenchymal scarring at the lung apices. There is no airspace disease, interstitial abnormality, or pneumothorax.  The sternum is intact.  There is a chronic loss of the inferior endplate height of the T12 vertebral body, unchanged dating back to 2008.  No acute thoracic spine vertebral body abnormality is seen. There is a subacute appearing fracture of the anterior left seventh rib.  There is some callus formation at the rib fracture.  No definite acute rib fracture is identified.  IMPRESSION:  1.  Left seventh rib fracture has callus formation and sclerotic margins and is favored to be subacute. 2.  No definite evidence of acute trauma to the chest. 3.  3 mm right middle lobe nodule can be considered benign,, given its stability since 2008. 4.  Stable chronic vertebral body height loss of the inferior endplate of T12.  CT ABDOMEN  AND PELVIS  Findings:  Chronic left hydronephrosis with a markedly dilated left renal pelvis is noted.  The dilatation of the left renal pelvis is greater than it was on the CT of 2009.  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  There is prominence of the right renal pelvis as well as dilatation of the calyces, consistent with moderate hydronephrosis.  This is also suspicious for ureteropelvic junction obstruction, chronic. The right renal parenchyma is preserved.  The patient's urinary bladder is markedly distended and demonstrates normal wall thickness.  The liver, gallbladder,  spleen, adrenal glands, pancreas are within normal limits.  Bowel loops are normal in caliber.  Prostate gland is normal.  No free air, ascites, or mesenteric abnormalities identified.  The soft tissues of the body wall are symmetric and show no evidence of acute trauma.  Inguinal regions are unremarkable.  There is atherosclerotic calcification of the normal caliber aorta and iliac vasculature.  Lumbar spine vertebral bodies are normal in height and alignment. The bony pelvis is intact.  No acute bony abnormality is identified.  IMPRESSION:  1.  No evidence of acute trauma to the abdomen or pelvis. 2.  Chronic bilateral hydronephrosis with a configuration most suggestive of chronic bilateral ureteropelvic junction obstruction. The degree of hydronephrosis appears more prominent on today's study compared to 2009 (which could be related to the patient's distended urinary bladder.  There is associated chronic and marked renal atrophy on the left.  Suggest urology follow-up if the patient is not already under the care of a urologist. 3.  Atherosclerosis of the normal caliber abdominal aorta and iliac vasculature.  Original Report Authenticated By: Britta Mccreedy, M.D.   Ct Cervical Spine Wo Contrast  09/07/2011  *RADIOLOGY REPORT*  Clinical Data:  Fall from ladder.  Head and posterior neck pain. The  CT HEAD WITHOUT CONTRAST CT CERVICAL SPINE WITHOUT CONTRAST  Technique:  Multidetector CT imaging of the head and cervical spine was performed following the standard protocol without intravenous contrast.  Multiplanar CT image reconstructions of the cervical spine were also generated.  Comparison:  Head CT 02/22/2008  CT HEAD  Findings: No intracranial hemorrhage.  No parenchymal contusion. No midline shift or mass effect.  Generalized cortical atrophy.  No evidence of skull base fracture.  No fluid the paranasal sinuses suggest trauma.  There is mucosal thickening within the right maxillary sinus.  Frontal sinuses are  opacified on the right which also appears chronic.  Orbits appear normal.  IMPRESSION:  1.  No evidence of acute intracranial trauma. 2.  Mild atrophy. 3.  Chronic right maxillary and right frontal sinus inflammation.  CT CERVICAL SPINE  Findings: There is no prevertebral soft tissue swelling.  Normal alignment cervical vertebral bodies.  There is endplate spurring at C6-C7.  Normal facet articulation.  Normal craniocervical junction. No evidence epidural paraspinal hematoma.  IMPRESSION:  1.  No evidence of cervical spine fracture. 2.  Mild spondylosis.  Original Report Authenticated By: Genevive Bi, M.D.   Ct Abdomen Pelvis W Contrast  09/07/2011  *RADIOLOGY REPORT*  Clinical Data:  Fall off of a ladder.  Chest pain.  CT CHEST, ABDOMEN AND PELVIS WITH CONTRAST  Technique:  Multidetector CT imaging of the chest, abdomen and pelvis was performed following the standard protocol during bolus administration of intravenous contrast.  Contrast: OMNIPAQUE IOHEXOL 300 MG/ML IV SOLN  Comparison:  CT chest 03/12/2007 and CT abdomen  pelvis 05/05/2008  CT CHEST  Findings:  Coronary artery atherosclerosis.  Normal heart size. Negative for pleural or pericardial effusion or lymphadenopathy. Thyroid gland unremarkable.  Axillary regions normal.  Soft tissues of the chest wall are symmetric.  No evidence of chest wall hematoma or stranding.  The trachea mainstem bronchi are patent.  A 3 mm right middle lobe nodule is unchanged compared to the CT of 2008, and can be considered benign.  There is minimal dependent atelectasis in the lower lobes.  Slight pleuroparenchymal scarring at the lung apices. There is no airspace disease, interstitial abnormality, or pneumothorax.  The sternum is intact.  There is a chronic loss of the inferior endplate height of the T12 vertebral body, unchanged dating back to 2008.  No acute thoracic spine vertebral body abnormality is seen. There is a subacute appearing fracture of the anterior  left seventh rib.  There is some callus formation at the rib fracture.  No definite acute rib fracture is identified.  IMPRESSION:  1.  Left seventh rib fracture has callus formation and sclerotic margins and is favored to be subacute. 2.  No definite evidence of acute trauma to the chest. 3.  3 mm right middle lobe nodule can be considered benign,, given its stability since 2008. 4.  Stable chronic vertebral body height loss of the inferior endplate of T12.  CT ABDOMEN AND PELVIS  Findings:  Chronic left hydronephrosis with a markedly dilated left renal pelvis is noted.  The dilatation of the left renal pelvis is greater than it was on the CT of 2009.  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  There is prominence of the right renal pelvis as well as dilatation of the calyces, consistent with moderate hydronephrosis.  This is also suspicious for ureteropelvic junction obstruction, chronic. The right renal parenchyma is preserved.  The patient's urinary bladder is markedly distended and demonstrates normal wall thickness.  The liver, gallbladder, spleen, adrenal glands, pancreas are within normal limits.  Bowel loops are normal in caliber.  Prostate gland is normal.  No free air, ascites, or mesenteric abnormalities identified.  The soft tissues of the body wall are symmetric and show no evidence of acute trauma.  Inguinal regions are unremarkable.  There is atherosclerotic calcification of the normal caliber aorta and iliac vasculature.  Lumbar spine vertebral bodies are normal in height and alignment. The bony pelvis is intact.  No acute bony abnormality is identified.  IMPRESSION:  1.  No evidence of acute trauma to the abdomen or pelvis. 2.  Chronic bilateral hydronephrosis with a configuration most suggestive of chronic bilateral ureteropelvic junction obstruction. The degree of  hydronephrosis appears more prominent on today's study compared to 2009 (which could be related to the patient's distended urinary bladder.  There is associated chronic and marked renal atrophy on the left.  Suggest urology follow-up if the patient is not already under the care of a urologist. 3.  Atherosclerosis of the normal caliber abdominal aorta and iliac vasculature.  Original Report Authenticated By: Britta Mccreedy, M.D.   Results for orders placed during the hospital encounter of 09/07/11  CBC      Component Value Range   WBC 8.5  4.0 - 10.5 (K/uL)   RBC 4.32  4.22 - 5.81 (MIL/uL)   Hemoglobin 14.7  13.0 - 17.0 (g/dL)   HCT 40.9  81.1 - 91.4 (%)   MCV 95.8  78.0 -  100.0 (fL)   MCH 34.0  26.0 - 34.0 (pg)   MCHC 35.5  30.0 - 36.0 (g/dL)   RDW 16.1  09.6 - 04.5 (%)   Platelets 239  150 - 400 (K/uL)  BASIC METABOLIC PANEL      Component Value Range   Sodium 137  135 - 145 (mEq/L)   Potassium 3.2 (*) 3.5 - 5.1 (mEq/L)   Chloride 99  96 - 112 (mEq/L)   CO2 26  19 - 32 (mEq/L)   Glucose, Bld 114 (*) 70 - 99 (mg/dL)   BUN 3 (*) 6 - 23 (mg/dL)   Creatinine, Ser 4.09  0.50 - 1.35 (mg/dL)   Calcium 9.7  8.4 - 81.1 (mg/dL)   GFR calc non Af Amer >90  >90 (mL/min)   GFR calc Af Amer >90  >90 (mL/min)  ETHANOL      Component Value Range   Alcohol, Ethyl (B) 258 (*) 0 - 11 (mg/dL)     1. Fall   2. Chest wall pain   3. Alcohol intoxication       MDM   Patient status post fall from roof onto railing that occurred at 8:30 this morning. Patient with complaint of midsternal chest pain and epigastric abdominal pain also states that he hit his head continued had lost consciousness for a brief period of time. In complaint is the substernal area chest pain. CT workup in the emergency department for trauma negative except for old seventh rib fracture no acute injuries. Patient with alcohol intoxication based on alcohol level of 258. Patient's vital signs are stable and stable to go  home.        Shelda Jakes, MD 09/07/11 [phone]

## 2011-09-10 ENCOUNTER — Encounter (HOSPITAL_COMMUNITY): Payer: Self-pay | Admitting: Emergency Medicine

## 2011-09-10 ENCOUNTER — Emergency Department (HOSPITAL_COMMUNITY)
Admission: EM | Admit: 2011-09-10 | Discharge: 2011-09-10 | Disposition: A | Discharge status: Home / Self Care | Payer: PRIVATE HEALTH INSURANCE | Attending: Emergency Medicine | Admitting: Emergency Medicine

## 2011-09-10 ENCOUNTER — Emergency Department (HOSPITAL_COMMUNITY): Payer: PRIVATE HEALTH INSURANCE

## 2011-09-10 DIAGNOSIS — R0789 Other chest pain: Secondary | ICD-10-CM

## 2011-09-10 DIAGNOSIS — R05 Cough: Secondary | ICD-10-CM | POA: Insufficient documentation

## 2011-09-10 DIAGNOSIS — F1010 Alcohol abuse, uncomplicated: Secondary | ICD-10-CM | POA: Insufficient documentation

## 2011-09-10 DIAGNOSIS — R071 Chest pain on breathing: Secondary | ICD-10-CM | POA: Insufficient documentation

## 2011-09-10 DIAGNOSIS — I459 Conduction disorder, unspecified: Secondary | ICD-10-CM | POA: Insufficient documentation

## 2011-09-10 DIAGNOSIS — F17200 Nicotine dependence, unspecified, uncomplicated: Secondary | ICD-10-CM | POA: Insufficient documentation

## 2011-09-10 DIAGNOSIS — W19XXXA Unspecified fall, initial encounter: Secondary | ICD-10-CM | POA: Insufficient documentation

## 2011-09-10 DIAGNOSIS — N269 Renal sclerosis, unspecified: Secondary | ICD-10-CM | POA: Insufficient documentation

## 2011-09-10 DIAGNOSIS — R0602 Shortness of breath: Secondary | ICD-10-CM | POA: Insufficient documentation

## 2011-09-10 DIAGNOSIS — R059 Cough, unspecified: Secondary | ICD-10-CM | POA: Insufficient documentation

## 2011-09-10 MED ORDER — HYDROMORPHONE HCL PF 1 MG/ML IJ SOLN
1.0000 mg | Freq: Once | INTRAMUSCULAR | Status: AC
  Administered 2011-09-10: 1 mg via INTRAVENOUS
  Filled 2011-09-10: qty 1

## 2011-09-10 MED ORDER — ONDANSETRON HCL 4 MG/2ML IJ SOLN
4.0000 mg | Freq: Once | INTRAMUSCULAR | Status: AC
  Administered 2011-09-10: 4 mg via INTRAVENOUS
  Filled 2011-09-10: qty 2

## 2011-09-10 NOTE — ED Notes (Signed)
Dr Bebe Shaggy informed of pt's request for pain medicine.

## 2011-09-10 NOTE — ED Provider Notes (Signed)
History  Scribed for Douglas Gaskins, MD, the patient was seen in room APA12/APA12. This chart was scribed by Candelaria Stagers. The patient's care started at 3:48PM     CSN: [id number]  Arrival date & time 09/10/11  1421   First MD Initiated Contact with Patient 09/10/11 1511      Chief Complaint  Patient presents with  . Chest Pain     The history is provided by the patient.   Douglas Stout is a 56 y.o. male who presents to the Emergency Department complaining of persistent chest pain on the left side of his chest after experiencing a fall three days ago.  Pt was seen in the ED on the 7th.  He states that he has experienced a cough, SOB.  He denies coughing up blood and abdominal pain.    PMH - alcohol abuse  History reviewed. No pertinent past surgical history.  History reviewed. No pertinent family history.  History  Substance Use Topics  . Smoking status: Current Everyday Smoker -- 2.0 packs/day  . Smokeless tobacco: Not on file  . Alcohol Use: Yes      Review of Systems  Constitutional:       Pt appears uncomfortable.  Respiratory: Positive for cough and shortness of breath.   Cardiovascular: Positive for chest pain.  Gastrointestinal: Negative for abdominal pain.  All other systems reviewed and are negative.    Allergies  Review of patient's allergies indicates no known allergies.  Home Medications   Current Outpatient Rx  Name Route Sig Dispense Refill  . HYDROCODONE-ACETAMINOPHEN 5-325 MG PO TABS Oral Take 1-2 tablets by mouth every 6 (six) hours as needed for pain. 14 tablet 0    BP 136/80  Pulse 93  Temp(Src) 97.5 F (36.4 C) (Oral)  Resp 23  Ht 5\' 9"  (1.753 m)  Wt 160 lb (72.576 kg)  BMI 23.63 kg/m2  SpO2 99%  Physical Exam CONSTITUTIONAL: Well developed/well nourished HEAD AND FACE: Normocephalic/atraumatic EYES: EOMI/PERRL ENMT: Mucous membranes moist NECK: supple no meningeal signs SPINE:entire spine nontender CHEST: central chest  wall tenderness, no crepitus.  CV: S1/S2 noted, no murmurs/rubs/gallops noted LUNGS: Lungs are clear to auscultation bilaterally, no apparent distress ABDOMEN: soft, nontender, no rebound or guarding GU:no cva tenderness NEURO: Pt is awake/alert, moves all extremitiesx4 EXTREMITIES: pulses normal, full ROM All other extremities/joints palpated/ranged and nontender SKIN: warm, color normal PSYCH: no abnormalities of mood noted  ED Course  Procedures   DIAGNOSTIC STUDIES: Oxygen Saturation is 99% on room air, normal by my interpretation.    COORDINATION OF CARE:  3:50PM Ordered: DG CHEST 1 VIEW, HYDROmorphone (DILAUDID) injection 1 mg ; ondansetron (ZOFRAN) injection 4 mg  Dg Chest 1 View  09/10/2011  *RADIOLOGY REPORT*  Clinical Data: Chest pain, fell a few days ago  CHEST - 1 VIEW  Comparison: 05/05/2008  Findings: Normal heart size, mediastinal contours, and pulmonary vascularity. Lungs clear. Question emphysematous changes. No pleural effusion or pneumothorax. Bones appear demineralized without focal abnormality.  IMPRESSION: Question emphysematous changes. No acute abnormalities.  Original Report Authenticated By: Lollie Marrow, M.D.     4:40 PM Pt s/p fall several days ago, reporting continued chest wall pain He is intoxicated but directable CXR reviewed I doubt ACS/PE/Dissection given his exam Per radiology will need f/u on renal atrophy per recent CT findings  5:04 PM Pt stable at this time  5:41 PM Pt has ride home Stable for d/c Advised urology followup   MDM  Nursing  notes reviewed and considered in documentation xrays reviewed and considered Previous records reviewed and considered    Date: 09/10/2011  Rate: 95  Rhythm: normal sinus rhythm  QRS Axis: right  Intervals: normal  ST/T Wave abnormalities: nonspecific ST changes  Conduction Disutrbances:nonspecific intraventricular conduction delay  Narrative Interpretation:      I personally performed  the services described in this documentation, which was scribed in my presence. The recorded information has been reviewed and considered.          Douglas Gaskins, MD 09/10/11 [phone]
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
## 2011-09-10 NOTE — ED Notes (Signed)
Pt to Xray.

## 2011-09-10 NOTE — ED Notes (Signed)
Pt states that he fell in the waiting room and injured his right knee. Dr. Bebe Shaggy notified.

## 2011-11-24 ENCOUNTER — Emergency Department (HOSPITAL_COMMUNITY): Payer: PRIVATE HEALTH INSURANCE

## 2011-11-24 ENCOUNTER — Encounter (HOSPITAL_COMMUNITY): Payer: Self-pay | Admitting: *Deleted

## 2011-11-24 ENCOUNTER — Emergency Department (HOSPITAL_COMMUNITY)
Admission: EM | Admit: 2011-11-24 | Discharge: 2011-11-24 | Disposition: A | Discharge status: Home / Self Care | Payer: PRIVATE HEALTH INSURANCE | Attending: Emergency Medicine | Admitting: Emergency Medicine

## 2011-11-24 DIAGNOSIS — R319 Hematuria, unspecified: Secondary | ICD-10-CM | POA: Insufficient documentation

## 2011-11-24 DIAGNOSIS — F17200 Nicotine dependence, unspecified, uncomplicated: Secondary | ICD-10-CM | POA: Insufficient documentation

## 2011-11-24 DIAGNOSIS — M545 Low back pain, unspecified: Secondary | ICD-10-CM | POA: Insufficient documentation

## 2011-11-24 DIAGNOSIS — R109 Unspecified abdominal pain: Secondary | ICD-10-CM | POA: Insufficient documentation

## 2011-11-24 DIAGNOSIS — M539 Dorsopathy, unspecified: Secondary | ICD-10-CM | POA: Insufficient documentation

## 2011-11-24 DIAGNOSIS — R209 Unspecified disturbances of skin sensation: Secondary | ICD-10-CM | POA: Insufficient documentation

## 2011-11-24 DIAGNOSIS — Z79899 Other long term (current) drug therapy: Secondary | ICD-10-CM | POA: Insufficient documentation

## 2011-11-24 LAB — DIFFERENTIAL
Eosinophils Relative: 2 % (ref 0–5)
Lymphocytes Relative: 40 % (ref 12–46)
Lymphs Abs: 3.4 10*3/uL (ref 0.7–4.0)
Monocytes Absolute: 0.6 10*3/uL (ref 0.1–1.0)
Monocytes Relative: 7 % (ref 3–12)

## 2011-11-24 LAB — URINALYSIS, ROUTINE W REFLEX MICROSCOPIC
Bilirubin Urine: NEGATIVE
Nitrite: NEGATIVE
Specific Gravity, Urine: 1.005 (ref 1.005–1.030)
Urobilinogen, UA: 0.2 mg/dL (ref 0.0–1.0)

## 2011-11-24 LAB — CBC
HCT: 43.5 % (ref 39.0–52.0)
Hemoglobin: 15.4 g/dL (ref 13.0–17.0)
MCH: 33.3 pg (ref 26.0–34.0)
MCV: 94 fL (ref 78.0–100.0)
RBC: 4.63 MIL/uL (ref 4.22–5.81)
WBC: 8.7 10*3/uL (ref 4.0–10.5)

## 2011-11-24 LAB — COMPREHENSIVE METABOLIC PANEL
ALT: 12 U/L (ref 0–53)
BUN: 5 mg/dL — ABNORMAL LOW (ref 6–23)
CO2: 26 mEq/L (ref 19–32)
Calcium: 9.4 mg/dL (ref 8.4–10.5)
Creatinine, Ser: 0.62 mg/dL (ref 0.50–1.35)
GFR calc Af Amer: >90 mL/min (ref >90)
GFR calc non Af Amer: >90 mL/min (ref >90)
Glucose, Bld: 110 mg/dL — ABNORMAL HIGH (ref 70–99)
Sodium: 140 mEq/L (ref 135–145)

## 2011-11-24 MED ORDER — ONDANSETRON HCL 4 MG/2ML IJ SOLN
4.0000 mg | Freq: Once | INTRAMUSCULAR | Status: AC
  Administered 2011-11-24: 4 mg via INTRAVENOUS
  Filled 2011-11-24: qty 2

## 2011-11-24 MED ORDER — CIPROFLOXACIN HCL 500 MG PO TABS: 500.0000 mg | ORAL_TABLET | Freq: Two times a day (BID) | ORAL | Status: AC

## 2011-11-24 MED ORDER — HYDROMORPHONE HCL PF 1 MG/ML IJ SOLN
1.0000 mg | Freq: Once | INTRAMUSCULAR | Status: AC
  Administered 2011-11-24: 1 mg via INTRAVENOUS
  Filled 2011-11-24: qty 1

## 2011-11-24 MED ORDER — OXYCODONE-ACETAMINOPHEN 5-325 MG PO TABS: 1.0000 | ORAL_TABLET | Freq: Four times a day (QID) | ORAL | Status: AC | PRN

## 2011-11-24 MED ORDER — PROMETHAZINE HCL 25 MG PO TABS: 25.0000 mg | ORAL_TABLET | Freq: Four times a day (QID) | ORAL | Status: AC | PRN

## 2011-11-24 MED ORDER — SODIUM CHLORIDE 0.9 % IV SOLN
Freq: Once | INTRAVENOUS | Status: AC
  Administered 2011-11-24: 20 mL/h via INTRAVENOUS

## 2011-11-24 MED ORDER — SODIUM CHLORIDE 0.9 % IV BOLUS (SEPSIS)
1000.0000 mL | Freq: Once | INTRAVENOUS | Status: AC
  Administered 2011-11-24: 1000 mL via INTRAVENOUS

## 2011-11-24 NOTE — ED Notes (Signed)
C/o lower back pain and hematuria onset 0400 today.
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
## 2011-11-24 NOTE — ED Notes (Signed)
Labs drawn with IV start.

## 2011-11-24 NOTE — Discharge Instructions (Signed)
Follow up with dr. javaid next week °

## 2011-11-26 NOTE — ED Provider Notes (Cosign Needed)
History     CSN: [id number]  Arrival date & time 11/24/11  0540   First MD Initiated Contact with Patient 11/24/11 [phone]      Chief Complaint  Patient presents with  . Back Pain  . Hematuria    (Consider location/radiation/quality/duration/timing/severity/associated sxs/prior treatment) Patient is a 56 y.o. male presenting with back pain. The history is provided by the patient (Patient complains of lower back). No language interpreter was used.  Back Pain  This is a recurrent problem. The current episode started 12 to 24 hours ago. The problem occurs constantly. The problem has not changed since onset.The pain is associated with no known injury. The pain is present in the lumbar spine. The quality of the pain is described as stabbing. The pain does not radiate. The pain is at a severity of 4/10. The pain is moderate. The symptoms are aggravated by bending. The pain is the same all the time. Stiffness is present all day. Associated symptoms include numbness. Pertinent negatives include no chest pain, no fever, no headaches and no abdominal pain. He has tried nothing for the symptoms. The treatment provided no relief. Risk factors include lack of exercise.    History reviewed. No pertinent past medical history.  History reviewed. No pertinent past surgical history.  No family history on file.  History  Substance Use Topics  . Smoking status: Current Everyday Smoker -- 2.0 packs/day  . Smokeless tobacco: Not on file  . Alcohol Use: Yes     everyday-12-24 beers/day      Review of Systems  Constitutional: Negative for fever and fatigue.  HENT: Negative for congestion, sinus pressure and ear discharge.   Eyes: Negative for discharge.  Respiratory: Negative for cough.   Cardiovascular: Negative for chest pain.  Gastrointestinal: Negative for abdominal pain and diarrhea.  Genitourinary: Positive for hematuria. Negative for frequency.  Musculoskeletal: Positive for back pain.    Skin: Negative for rash.  Neurological: Positive for numbness. Negative for seizures and headaches.  Hematological: Negative.   Psychiatric/Behavioral: Negative for hallucinations.    Allergies  Review of patient's allergies indicates no known allergies.  Home Medications   Current Outpatient Rx  Name Route Sig Dispense Refill  . ASPIRIN 81 MG PO TABS Oral Take 81 mg by mouth daily.    . MULTI-VITAMIN/MINERALS PO TABS Oral Take 1 tablet by mouth daily.    Marland Kitchen CIPROFLOXACIN HCL 500 MG PO TABS Oral Take 1 tablet (500 mg total) by mouth every 12 (twelve) hours. 14 tablet 0  . OXYCODONE-ACETAMINOPHEN 5-325 MG PO TABS Oral Take 1 tablet by mouth every 6 (six) hours as needed for pain. 30 tablet 0  . PROMETHAZINE HCL 25 MG PO TABS Oral Take 1 tablet (25 mg total) by mouth every 6 (six) hours as needed for nausea. 15 tablet 0    BP 130/82  Pulse 87  Temp(Src) 98 F (36.7 C) (Oral)  Resp 16  Ht 5\' 8"  (1.727 m)  Wt 130 lb (58.968 kg)  BMI 19.77 kg/m2  SpO2 92%  Physical Exam  Constitutional: He is oriented to person, place, and time. He appears well-developed.  HENT:  Head: Normocephalic and atraumatic.  Eyes: Conjunctivae and EOM are normal. No scleral icterus.  Neck: Neck supple. No thyromegaly present.  Cardiovascular: Normal rate and regular rhythm.  Exam reveals no gallop and no friction rub.   No murmur heard. Pulmonary/Chest: No stridor. He has no wheezes. He has no rales. He exhibits no tenderness.  Abdominal: He  exhibits no distension. There is no tenderness. There is no rebound.  Musculoskeletal: Normal range of motion. He exhibits no edema.       Tender lumbar muscles bilaterally  Lymphadenopathy:    He has no cervical adenopathy.  Neurological: He is oriented to person, place, and time. Coordination normal.  Skin: No rash noted. No erythema.  Psychiatric: He has a normal mood and affect. His behavior is normal.    ED Course  Procedures (including critical care  time)  Labs Reviewed  COMPREHENSIVE METABOLIC PANEL - Abnormal; Notable for the following:    Potassium 3.0 (*)    Glucose, Bld 110 (*)    BUN 5 (*)    All other components within normal limits  URINALYSIS, ROUTINE W REFLEX MICROSCOPIC - Abnormal; Notable for the following:    Hgb urine dipstick LARGE (*)    All other components within normal limits  CBC  DIFFERENTIAL  URINE MICROSCOPIC-ADD ON  LAB REPORT - SCANNED   No results found.   1. Hematuria     Results for orders placed during the hospital encounter of 11/24/11  CBC      Component Value Range   WBC 8.7  4.0 - 10.5 (K/uL)   RBC 4.63  4.22 - 5.81 (MIL/uL)   Hemoglobin 15.4  13.0 - 17.0 (g/dL)   HCT 16.1  09.6 - 04.5 (%)   MCV 94.0  78.0 - 100.0 (fL)   MCH 33.3  26.0 - 34.0 (pg)   MCHC 35.4  30.0 - 36.0 (g/dL)   RDW 40.9  81.1 - 91.4 (%)   Platelets 249  150 - 400 (K/uL)  DIFFERENTIAL      Component Value Range   Neutrophils Relative 51  43 - 77 (%)   Neutro Abs 4.4  1.7 - 7.7 (K/uL)   Lymphocytes Relative 40  12 - 46 (%)   Lymphs Abs 3.4  0.7 - 4.0 (K/uL)   Monocytes Relative 7  3 - 12 (%)   Monocytes Absolute 0.6  0.1 - 1.0 (K/uL)   Eosinophils Relative 2  0 - 5 (%)   Eosinophils Absolute 0.2  0.0 - 0.7 (K/uL)   Basophils Relative 1  0 - 1 (%)   Basophils Absolute 0.1  0.0 - 0.1 (K/uL)  COMPREHENSIVE METABOLIC PANEL      Component Value Range   Sodium 140  135 - 145 (mEq/L)   Potassium 3.0 (*) 3.5 - 5.1 (mEq/L)   Chloride 100  96 - 112 (mEq/L)   CO2 26  19 - 32 (mEq/L)   Glucose, Bld 110 (*) 70 - 99 (mg/dL)   BUN 5 (*) 6 - 23 (mg/dL)   Creatinine, Ser 7.82  0.50 - 1.35 (mg/dL)   Calcium 9.4  8.4 - 95.6 (mg/dL)   Total Protein 7.8  6.0 - 8.3 (g/dL)   Albumin 3.9  3.5 - 5.2 (g/dL)   AST 26  0 - 37 (U/L)   ALT 12  0 - 53 (U/L)   Alkaline Phosphatase 71  39 - 117 (U/L)   Total Bilirubin 0.4  0.3 - 1.2 (mg/dL)   GFR calc non Af Amer >90  >90 (mL/min)   GFR calc Af Amer >90  >90 (mL/min)  URINALYSIS,  ROUTINE W REFLEX MICROSCOPIC      Component Value Range   Color, Urine YELLOW  YELLOW    APPearance CLEAR  CLEAR    Specific Gravity, Urine 1.005  1.005 - 1.030    pH  6.0  5.0 - 8.0    Glucose, UA NEGATIVE  NEGATIVE (mg/dL)   Hgb urine dipstick LARGE (*) NEGATIVE    Bilirubin Urine NEGATIVE  NEGATIVE    Ketones, ur NEGATIVE  NEGATIVE (mg/dL)   Protein, ur NEGATIVE  NEGATIVE (mg/dL)   Urobilinogen, UA 0.2  0.0 - 1.0 (mg/dL)   Nitrite NEGATIVE  NEGATIVE    Leukocytes, UA NEGATIVE  NEGATIVE   URINE MICROSCOPIC-ADD ON      Component Value Range   RBC / HPF TOO NUMEROUS TO COUNT  <3 (RBC/hpf)   Ct Abdomen Pelvis Wo Contrast  11/24/2011  *RADIOLOGY REPORT*  Clinical Data: Bilateral flank pain, left greater than right. Hematuria.  CT ABDOMEN AND PELVIS WITHOUT CONTRAST  Technique:  Multidetector CT imaging of the abdomen and pelvis was performed following the standard protocol without intravenous contrast.  Comparison: CT of the chest, abdomen and pelvis performed 09/07/2011  Findings: The visualized lung bases are clear.  The liver and spleen are unremarkable in appearance.  The gallbladder is within normal limits.  The pancreas and adrenal glands are unremarkable.  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  There is stable chronic left renal atrophy.  Nonspecific perinephric stranding is noted bilaterally, more prominent than on the prior study; mild pyelonephritis cannot be excluded.  The degree of distension of the left renal collecting system is slightly decreased from the prior study.  No free fluid is identified.  The small bowel is unremarkable in appearance.  The stomach is within normal limits.  No acute vascular abnormalities are seen.  Scattered calcification is noted along the abdominal aorta and its branches.  The appendix is not definitely seen; there  is no evidence for appendicitis.  The colon is grossly unremarkable in appearance.  The bladder is mildly distended and grossly unremarkable in appearance; the slightly thick-walled appearance of the bladder is thought to reflect relative decompression.  The prostate remains normal in size.  No inguinal lymphadenopathy is seen.  No acute osseous abnormalities are identified.  There is chronic compression deformity at vertebral body T12, stable from the prior study.  IMPRESSION:  1.  Nonspecific bilateral perinephric stranding is more prominent than on the prior study; mild pyelonephritis cannot be entirely excluded. 2.  Persistent chronic moderate bilateral hydronephrosis, slightly worse on the left; the appearance again raises concern for bilateral ureteropelvic junction obstruction.  No evidence of ureteral stones.  The degree of left renal collecting system distension is mildly decreased from the prior study. 3.  Scattered calcification along the abdominal aorta and its branches. 4.  Stable chronic compression deformity of vertebral body T12.  Original Report Authenticated By: Tonia Ghent, M.D.      MDM  Hematuria, with hydronephrosis will culture urine and tx with antibiotics and referr to urology        Benny Lennert, MD 11/26/11 [phone]

## 2013-01-07 ENCOUNTER — Emergency Department (HOSPITAL_COMMUNITY): Payer: BC Managed Care – PPO

## 2013-01-07 ENCOUNTER — Emergency Department (HOSPITAL_COMMUNITY)
Admission: EM | Admit: 2013-01-07 | Discharge: 2013-01-07 | Disposition: A | Discharge status: Home / Self Care | Payer: BC Managed Care – PPO | Attending: Emergency Medicine | Admitting: Emergency Medicine

## 2013-01-07 ENCOUNTER — Encounter (HOSPITAL_COMMUNITY): Payer: Self-pay | Admitting: *Deleted

## 2013-01-07 DIAGNOSIS — F17200 Nicotine dependence, unspecified, uncomplicated: Secondary | ICD-10-CM | POA: Insufficient documentation

## 2013-01-07 DIAGNOSIS — S8392XA Sprain of unspecified site of left knee, initial encounter: Secondary | ICD-10-CM

## 2013-01-07 DIAGNOSIS — Y929 Unspecified place or not applicable: Secondary | ICD-10-CM | POA: Insufficient documentation

## 2013-01-07 DIAGNOSIS — X500XXA Overexertion from strenuous movement or load, initial encounter: Secondary | ICD-10-CM | POA: Insufficient documentation

## 2013-01-07 DIAGNOSIS — Z7982 Long term (current) use of aspirin: Secondary | ICD-10-CM | POA: Insufficient documentation

## 2013-01-07 DIAGNOSIS — M25462 Effusion, left knee: Secondary | ICD-10-CM

## 2013-01-07 DIAGNOSIS — W08XXXA Fall from other furniture, initial encounter: Secondary | ICD-10-CM | POA: Insufficient documentation

## 2013-01-07 DIAGNOSIS — IMO0002 Reserved for concepts with insufficient information to code with codable children: Secondary | ICD-10-CM | POA: Insufficient documentation

## 2013-01-07 DIAGNOSIS — Z79899 Other long term (current) drug therapy: Secondary | ICD-10-CM | POA: Insufficient documentation

## 2013-01-07 DIAGNOSIS — Y939 Activity, unspecified: Secondary | ICD-10-CM | POA: Insufficient documentation

## 2013-01-07 MED ORDER — HYDROCODONE-ACETAMINOPHEN 5-325 MG PO TABS: 2.0000 | ORAL_TABLET | ORAL | Status: DC | PRN

## 2013-01-07 MED ORDER — HYDROCODONE-ACETAMINOPHEN 5-325 MG PO TABS
2.0000 | ORAL_TABLET | Freq: Once | ORAL | Status: AC
  Administered 2013-01-07: 2 via ORAL
  Filled 2013-01-07: qty 2

## 2013-01-07 NOTE — ED Notes (Signed)
Patient reports has taken a total of 1200mg  of ibuprofen within the past 4 hours.

## 2013-01-07 NOTE — ED Notes (Signed)
Pt reporting falling through wooden ramp, reporting pain and swelling in left knee.

## 2013-01-07 NOTE — ED Notes (Addendum)
Patient reported had crutches at home, did not want to receive a set in ED as requested by patient. Verbalized would use his at home.

## 2013-01-07 NOTE — ED Provider Notes (Signed)
History     CSN: [id number]  Arrival date & time 01/07/13  2007   First MD Initiated Contact with Patient 01/07/13 2045      Chief Complaint  Patient presents with  . Knee Pain    (Consider location/radiation/quality/duration/timing/severity/associated sxs/prior treatment) HPI Comments: Patient comes to the ER for evaluation of left knee injury. Patient reports that he fell through a wooden wheelchair ramp earlier today. His leg went through the floor and twisted. Patient has had pain and swelling of the knee ever since. He says he is having trouble apparently because of the pain and he also gives out. No other injury.  Patient is a 57 y.o. male presenting with knee pain.  Knee Pain   History reviewed. No pertinent past medical history.  History reviewed. No pertinent past surgical history.  History reviewed. No pertinent family history.  History  Substance Use Topics  . Smoking status: Current Every Day Smoker -- 2.00 packs/day  . Smokeless tobacco: Not on file  . Alcohol Use: Yes     Comment: everyday-12-24 beers/day      Review of Systems  Musculoskeletal: Positive for arthralgias.    Allergies  Review of patient's allergies indicates no known allergies.  Home Medications   Current Outpatient Rx  Name  Route  Sig  Dispense  Refill  . omeprazole (PRILOSEC OTC) 20 MG tablet   Oral   Take 20 mg by mouth daily.         Marland Kitchen aspirin 81 MG tablet   Oral   Take 81 mg by mouth daily.         . Multiple Vitamins-Minerals (MULTIVITAMIN WITH MINERALS) tablet   Oral   Take 1 tablet by mouth daily.           BP 146/98  Pulse 100  Temp(Src) 97.3 F (36.3 C) (Oral)  Resp 22  Ht 5\' 8"  (1.727 m)  Wt 125 lb (56.7 kg)  BMI 19.01 kg/m2  SpO2 100%  Physical Exam  Neck: Normal range of motion. No tracheal tenderness present.  Cardiovascular:  Pulses:      Dorsalis pedis pulses are 2+ on the left side.  Musculoskeletal:       Left knee: He exhibits  swelling and effusion. He exhibits normal range of motion, no ecchymosis, no deformity, no laceration, no erythema, normal alignment, no LCL laxity and no MCL laxity. Tenderness found.       Lumbar back: He exhibits no tenderness.    ED Course  Procedures (including critical care time)  Labs Reviewed - No data to display Dg Knee Complete 4 Views Left  01/07/2013  *RADIOLOGY REPORT*  Clinical Data: Left knee twist injury today, fall, left knee pain  LEFT KNEE - COMPLETE 4+ VIEW  Comparison: None  Findings: Osseous mineralization grossly normal for technique. Diffuse joint space narrowing. Minimal marginal spur formation. No acute fracture, dislocation or bone destruction. Knee joint effusion present.  IMPRESSION: Osteoarthritic changes with knee joint effusion. No definite acute bony findings.   Original Report Authenticated By: Ulyses Southward, M.D.      Diagnosis: 1. Knee sprain 2. Knee effusion 3. Rule out ligamentous injury of knee    MDM  Patient comes to the ER for evaluation of left knee injury. Patient has a large effusion present on examination. There is no ligamentous instability. Fracture or dislocation. Patient placed in the immobilizer. Will be provided analgesia. Follow up with orthopedics.        Gilda Crease,  MD 01/07/13 2150

## 2013-01-12 ENCOUNTER — Ambulatory Visit: Payer: BC Managed Care – PPO | Admitting: Orthopedic Surgery

## 2013-01-16 ENCOUNTER — Emergency Department (HOSPITAL_COMMUNITY)
Admission: EM | Admit: 2013-01-16 | Discharge: 2013-01-16 | Discharge status: Against Medical Advice | Payer: BC Managed Care – PPO | Attending: Emergency Medicine | Admitting: Emergency Medicine

## 2013-01-16 ENCOUNTER — Encounter (HOSPITAL_COMMUNITY): Payer: Self-pay | Admitting: *Deleted

## 2013-01-16 DIAGNOSIS — S6990XA Unspecified injury of unspecified wrist, hand and finger(s), initial encounter: Secondary | ICD-10-CM | POA: Insufficient documentation
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
## 2013-01-16 NOTE — ED Notes (Signed)
Went in to check on pt, pt getting dressed and reported " I am tired of waiting, ya'll aren't doing anything for me." Pt signed ED elopement form. Charge RN aware.

## 2013-01-16 NOTE — ED Notes (Signed)
[employer] has come and spoke with pt.

## 2013-01-17 ENCOUNTER — Encounter (HOSPITAL_COMMUNITY): Payer: Self-pay | Admitting: *Deleted

## 2013-01-17 ENCOUNTER — Emergency Department (HOSPITAL_COMMUNITY)
Admission: EM | Admit: 2013-01-17 | Discharge: 2013-01-17 | Disposition: A | Discharge status: Home / Self Care | Payer: BC Managed Care – PPO | Attending: Emergency Medicine | Admitting: Emergency Medicine

## 2013-01-17 ENCOUNTER — Emergency Department (HOSPITAL_COMMUNITY): Payer: BC Managed Care – PPO

## 2013-01-17 DIAGNOSIS — S61409A Unspecified open wound of unspecified hand, initial encounter: Secondary | ICD-10-CM | POA: Insufficient documentation

## 2013-01-17 DIAGNOSIS — F17200 Nicotine dependence, unspecified, uncomplicated: Secondary | ICD-10-CM | POA: Insufficient documentation

## 2013-01-17 DIAGNOSIS — S6992XA Unspecified injury of left wrist, hand and finger(s), initial encounter: Secondary | ICD-10-CM

## 2013-01-17 MED ORDER — IBUPROFEN 800 MG PO TABS: 800.0000 mg | ORAL_TABLET | Freq: Three times a day (TID) | ORAL | Status: DC

## 2013-01-17 MED ORDER — DOXYCYCLINE HYCLATE 100 MG PO CAPS: 100.0000 mg | ORAL_CAPSULE | Freq: Two times a day (BID) | ORAL | Status: DC

## 2013-01-17 MED ORDER — VANCOMYCIN HCL IN DEXTROSE 1-5 GM/200ML-% IV SOLN
1000.0000 mg | Freq: Once | INTRAVENOUS | Status: AC
  Administered 2013-01-17: 1000 mg via INTRAVENOUS
  Filled 2013-01-17: qty 200

## 2013-01-17 MED ORDER — HYDROCODONE-ACETAMINOPHEN 5-325 MG PO TABS
1.0000 | ORAL_TABLET | Freq: Once | ORAL | Status: AC
  Administered 2013-01-17: 1 via ORAL
  Filled 2013-01-17: qty 1

## 2013-01-17 NOTE — ED Notes (Signed)
Pt alert & oriented x4, stable gait. Patient given discharge instructions, paperwork & prescription(s). Patient  instructed to stop at the registration desk to finish any additional paperwork. Patient verbalized understanding. Pt left department w/ no further questions. 
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
## 2013-06-29 ENCOUNTER — Encounter (HOSPITAL_COMMUNITY): Payer: Self-pay | Admitting: Emergency Medicine

## 2013-06-29 ENCOUNTER — Emergency Department (HOSPITAL_COMMUNITY)
Admission: EM | Admit: 2013-06-29 | Discharge: 2013-06-29 | Discharge status: Against Medical Advice | Payer: BC Managed Care – PPO | Attending: Emergency Medicine | Admitting: Emergency Medicine

## 2013-06-29 ENCOUNTER — Emergency Department (HOSPITAL_COMMUNITY): Payer: BC Managed Care – PPO

## 2013-06-29 DIAGNOSIS — Y9289 Other specified places as the place of occurrence of the external cause: Secondary | ICD-10-CM | POA: Insufficient documentation

## 2013-06-29 DIAGNOSIS — Z792 Long term (current) use of antibiotics: Secondary | ICD-10-CM | POA: Insufficient documentation

## 2013-06-29 DIAGNOSIS — R079 Chest pain, unspecified: Secondary | ICD-10-CM

## 2013-06-29 DIAGNOSIS — R000 Tachycardia, unspecified: Secondary | ICD-10-CM | POA: Insufficient documentation

## 2013-06-29 DIAGNOSIS — R296 Repeated falls: Secondary | ICD-10-CM | POA: Insufficient documentation

## 2013-06-29 DIAGNOSIS — S298XXA Other specified injuries of thorax, initial encounter: Secondary | ICD-10-CM | POA: Insufficient documentation

## 2013-06-29 DIAGNOSIS — F17200 Nicotine dependence, unspecified, uncomplicated: Secondary | ICD-10-CM | POA: Insufficient documentation

## 2013-06-29 DIAGNOSIS — Y9389 Activity, other specified: Secondary | ICD-10-CM | POA: Insufficient documentation

## 2013-06-29 LAB — CBC
MCH: 34.8 pg — ABNORMAL HIGH (ref 26.0–34.0)
MCV: 95.2 fL (ref 78.0–100.0)
Platelets: 226 10*3/uL (ref 150–400)
RDW: 13 % (ref 11.5–15.5)
WBC: 7.8 10*3/uL (ref 4.0–10.5)

## 2013-06-29 LAB — BASIC METABOLIC PANEL
BUN: 6 mg/dL (ref 6–23)
Creatinine, Ser: 0.8 mg/dL (ref 0.50–1.35)
GFR calc Af Amer: >90 mL/min (ref >90)
GFR calc non Af Amer: >90 mL/min (ref >90)
Potassium: 3.5 mEq/L (ref 3.5–5.1)

## 2013-06-29 LAB — D-DIMER, QUANTITATIVE: D-Dimer, Quant: 0.51 ug{FEU}/mL — ABNORMAL HIGH (ref 0.00–0.48)

## 2013-06-29 MED ORDER — ONDANSETRON HCL 4 MG/2ML IJ SOLN
4.0000 mg | Freq: Once | INTRAMUSCULAR | Status: DC
  Filled 2013-06-29: qty 2

## 2013-06-29 MED ORDER — ONDANSETRON HCL 4 MG/2ML IJ SOLN
4.0000 mg | Freq: Once | INTRAMUSCULAR | Status: AC
  Administered 2013-06-29: 4 mg via INTRAVENOUS

## 2013-06-29 MED ORDER — MORPHINE SULFATE 4 MG/ML IJ SOLN
4.0000 mg | Freq: Once | INTRAMUSCULAR | Status: AC
  Administered 2013-06-29: 4 mg via INTRAVENOUS
  Filled 2013-06-29: qty 1

## 2013-06-29 NOTE — ED Notes (Signed)
Patient again states he will be leaving.  He pulled the IV catheter from his arm rather than wait for RN.  Patient was asked to wait and sign out AMA, refused stating "I ain't signing anything"  Walked out while RN was stating to him - your are very sick, if you leave you could die - his response was "I hope I do - then that will ? Show everyone"

## 2013-06-29 NOTE — ED Notes (Signed)
Dr Adriana Simas in to speak to patient and advise him of reasons he needs to stay.  Patient has stated since he arrived that he was going to get up and leave.

## 2013-06-29 NOTE — ED Notes (Signed)
Dr Adriana Simas aware patient is leaving - advised to have patient sign out AMA - he has talked with him about the severity of his illness and advised him to stay.

## 2013-06-29 NOTE — ED Provider Notes (Signed)
CSN: [id number]     Arrival date & time 06/29/13  2119 History  This chart was scribed for Donnetta Hutching, MD by Dorothey Baseman, ED Scribe. This patient was seen in room APA18/[facility]18 and the patient's care was started at 10:01 PM.    Chief Complaint  Patient presents with  . Chest Pain   The history is provided by the patient. The history is limited by the condition of the patient. No language interpreter was used.     HPI Comments: Douglas Stout is a 57 y.o. male who presents to the Emergency Department complaining of a constant pain to the left side of the chest onset around 1 hour ago when he states that he fell in a parking lot and expresses that he may have fractured a rib. Patient states that he did not want to come to the ED today, but that he was brought here. Patient states that he is "ready to go home" and would "rather die in the street." Patient agreed to be seen. He reports taking a Percocet at home without relief.  Patient reports associated shortness of breath. Patient denies any other pertinent medical history.   History reviewed. No pertinent past medical history. History reviewed. No pertinent past surgical history. No family history on file. History  Substance Use Topics  . Smoking status: Current Every Day Smoker -- 2.00 packs/day  . Smokeless tobacco: Not on file  . Alcohol Use: Yes     Comment: everyday-12-24 beers/day    Review of Systems  Unable to perform ROS    Allergies  Review of patient's allergies indicates no known allergies.  Home Medications   Current Outpatient Rx  Name  Route  Sig  Dispense  Refill  . doxycycline (VIBRAMYCIN) 100 MG capsule   Oral   Take 1 capsule (100 mg total) by mouth 2 (two) times daily.   20 capsule   0   . ibuprofen (ADVIL,MOTRIN) 800 MG tablet   Oral   Take 1 tablet (800 mg total) by mouth 3 (three) times daily.   21 tablet   0    Triage Vitals: BP 128/84  Pulse 102  Temp(Src) 97.9 F (36.6 C) (Oral)  Resp 26   SpO2 98%  Physical Exam  Nursing note and vitals reviewed. Constitutional: He is oriented to person, place, and time. He appears well-developed and well-nourished.  Patient appears agitated.   HENT:  Head: Normocephalic and atraumatic.  Eyes: Conjunctivae and EOM are normal. Pupils are equal, round, and reactive to light.  Neck: Normal range of motion. Neck supple.  Cardiovascular: Regular rhythm and normal heart sounds.   Tachycardic  Pulmonary/Chest: Effort normal and breath sounds normal. No respiratory distress.  Abdominal: Soft. Bowel sounds are normal.  Musculoskeletal: Normal range of motion.  Neurological: He is alert and oriented to person, place, and time.  Skin: Skin is warm and dry.  Psychiatric: He has a normal mood and affect.    ED Course  Procedures (including critical care time)  DIAGNOSTIC STUDIES: Oxygen Saturation is 98% on room air, normal by my interpretation.    COORDINATION OF CARE: 10:05 PM- Will order morphine and Zofran to manage symptoms. Will order blood labs and a chest x-ray. Discussed treatment plan with patient at bedside and patient verbalized agreement.     Labs Review Labs Reviewed  CBC - Abnormal; Notable for the following:    MCH 34.8 (*)    MCHC 36.6 (*)    All other components  within normal limits  PRO B NATRIURETIC PEPTIDE - Abnormal; Notable for the following:    Pro B Natriuretic peptide (BNP) 169.4 (*)    All other components within normal limits  BASIC METABOLIC PANEL - Abnormal; Notable for the following:    Glucose, Bld 131 (*)    All other components within normal limits  D-DIMER, QUANTITATIVE - Abnormal; Notable for the following:    D-Dimer, Quant 0.51 (*)    All other components within normal limits  TROPONIN I   Imaging Review Dg Chest Port 1 View  06/29/2013   CLINICAL DATA:  Chest pain.  EXAM: PORTABLE CHEST - 1 VIEW  COMPARISON:  09/10/2011  FINDINGS: Chronic mild interstitial coarsening and hyperinflation. No  focal opacity, effusion, or pneumothorax. Normal heart size. No acute osseous findings - noted chronic irregularity of the medial left humeral neck.  IMPRESSION: No evidence of acute cardiopulmonary disease.   Electronically Signed   By: Tiburcio Pea M.D.   On: 06/29/2013 22:41    EKG Interpretation   None       MDM  No diagnosis found. I suggested to this patient that we admit him for chest pain.  He refused admission. He was lucid. He was nonpsychotic. He understands risks and benefits of his decision including heart attack, pulmonary embolism   I personally performed the services described in this documentation, which was scribed in my presence. The recorded information has been reviewed and is accurate.     Donnetta Hutching, MD 06/29/13 [phone]

## 2013-06-29 NOTE — ED Notes (Signed)
Patient c/o left sided chest pain that started 30 minutes ago.  Patient with shortness of breath.

## 2014-02-15 ENCOUNTER — Emergency Department (HOSPITAL_COMMUNITY): Payer: BC Managed Care – PPO

## 2014-02-15 ENCOUNTER — Encounter (HOSPITAL_COMMUNITY): Payer: Self-pay | Admitting: Emergency Medicine

## 2014-02-15 ENCOUNTER — Emergency Department (HOSPITAL_COMMUNITY)
Admission: EM | Admit: 2014-02-15 | Discharge: 2014-02-15 | Disposition: A | Discharge status: Home / Self Care | Payer: BC Managed Care – PPO | Attending: Emergency Medicine | Admitting: Emergency Medicine

## 2014-02-15 DIAGNOSIS — F17200 Nicotine dependence, unspecified, uncomplicated: Secondary | ICD-10-CM | POA: Insufficient documentation

## 2014-02-15 DIAGNOSIS — I10 Essential (primary) hypertension: Secondary | ICD-10-CM | POA: Insufficient documentation

## 2014-02-15 DIAGNOSIS — Z79899 Other long term (current) drug therapy: Secondary | ICD-10-CM | POA: Insufficient documentation

## 2014-02-15 DIAGNOSIS — R000 Tachycardia, unspecified: Secondary | ICD-10-CM | POA: Insufficient documentation

## 2014-02-15 DIAGNOSIS — M436 Torticollis: Secondary | ICD-10-CM | POA: Insufficient documentation

## 2014-02-15 DIAGNOSIS — I252 Old myocardial infarction: Secondary | ICD-10-CM | POA: Insufficient documentation

## 2014-02-15 MED ORDER — KETOROLAC TROMETHAMINE 60 MG/2ML IM SOLN
60.0000 mg | Freq: Once | INTRAMUSCULAR | Status: AC
  Administered 2014-02-15: 60 mg via INTRAMUSCULAR
  Filled 2014-02-15: qty 2

## 2014-02-15 MED ORDER — IBUPROFEN 600 MG PO TABS: 600.0000 mg | ORAL_TABLET | Freq: Four times a day (QID) | ORAL | Status: DC | PRN

## 2014-02-15 MED ORDER — DIAZEPAM 5 MG PO TABS
5.0000 mg | ORAL_TABLET | Freq: Once | ORAL | Status: AC
  Administered 2014-02-15: 5 mg via ORAL
  Filled 2014-02-15: qty 1

## 2014-02-15 MED ORDER — CYCLOBENZAPRINE HCL 10 MG PO TABS: 10.0000 mg | ORAL_TABLET | Freq: Two times a day (BID) | ORAL | Status: DC | PRN

## 2014-02-15 MED ORDER — OXYCODONE-ACETAMINOPHEN 5-325 MG PO TABS: 1.0000 | ORAL_TABLET | ORAL | Status: DC | PRN

## 2014-02-15 MED ORDER — OXYCODONE-ACETAMINOPHEN 5-325 MG PO TABS
1.0000 | ORAL_TABLET | Freq: Once | ORAL | Status: AC
  Administered 2014-02-15: 1 via ORAL
  Filled 2014-02-15: qty 1

## 2014-02-15 NOTE — ED Notes (Signed)
PT c/o neck pain radiating into his left arm x3 days. PT denies N/V or recent fevers/chills.

## 2014-02-15 NOTE — Discharge Instructions (Signed)
Do not take the muscle relaxant or the narcotic if you are driving because they will make you sleepy. Return as needed for worsening symptoms.

## 2014-02-15 NOTE — ED Provider Notes (Signed)
  Medical screening examination/treatment/procedure(s) were performed by non-physician practitioner and as supervising physician I was immediately available for consultation/collaboration.   EKG Interpretation   Date/Time:  Thursday February 15 2014 11:59:06 EDT Ventricular Rate:  105 PR Interval:  132 QRS Duration: 100 QT Interval:  340 QTC Calculation: 449 R Axis:   97 Text Interpretation:  Sinus tachycardia Rightward axis Borderline ECG  Sinus tachycardia Rightward axis Artifact No significant change since last  tracing Abnormal ekg Confirmed by Carmin Muskrat  MD ([id number]) on 02/15/2014  12:08:56 PM         Carmin Muskrat, MD 02/15/14 1459

## 2014-02-15 NOTE — ED Notes (Addendum)
Patient c/o severe neck pain that started 3 days ago. Per patient unable to turn neck. Per patient sharp pain radiating down left arm. Patient states "I though it was just a crick in my neck but I've never had one last this long." Denies any known fevers.

## 2014-02-15 NOTE — ED Notes (Signed)
NP at bedside.

## 2014-02-15 NOTE — ED Provider Notes (Signed)
CSN: [id number]     Arrival date & time 02/15/14  1140 History   First MD Initiated Contact with Patient 02/15/14 1155     Chief Complaint  Patient presents with  . Neck Pain     (Consider location/radiation/quality/duration/timing/severity/associated sxs/prior Treatment) Patient is a 58 y.o. male presenting with neck pain. The history is provided by the patient.  Neck Pain Pain location:  L side, R side and occipital region Quality:  Shooting, aching and burning Pain radiates to:  L shoulder and L arm Pain severity:  Moderate Onset quality:  Sudden Duration:  3 days Timing:  Constant Progression:  Worsening Chronicity:  New Relieved by:  Nothing Associated symptoms: no bladder incontinence, no bowel incontinence, no chest pain, no fever, no headaches, no leg pain, no numbness, no tingling and no weakness    Douglas Stout is a 58 y.o. male who presents to the ED with neck pain that started 3 days ago. He states that he woke one morning and the right side of his neck was hurting. Pain is worse with any movement.  He thought it was a crick but it has continued and is much worse. No the pain is in the back of his neck and on both sides and radiates down his left arm. He denies chest pain, nausea or vomiting, fever or chills. He states nothing but his neck is bothering him except when the pain goes down his arm. PMH significant for c-spine injury 10 years ago, MI and HTN.   Past Medical History  Diagnosis Date  . Hypertension   . Acute MI    History reviewed. No pertinent past surgical history. Family History  Problem Relation Age of Onset  . Cancer Mother    History  Substance Use Topics  . Smoking status: Current Every Day Smoker -- 2.00 packs/day for 42 years    Types: Cigarettes  . Smokeless tobacco: Never Used  . Alcohol Use: 3.6 oz/week    6 Cans of beer per week    Review of Systems  Constitutional: Negative for fever.  Cardiovascular: Negative for chest pain.   Gastrointestinal: Negative for bowel incontinence.  Genitourinary: Negative for bladder incontinence.  Musculoskeletal: Positive for neck pain.       Left arm pain  Neurological: Negative for tingling, weakness, numbness and headaches.  All other systems negative.    Allergies  Review of patient's allergies indicates no known allergies.  Home Medications   Prior to Admission medications   Medication Sig Start Date End Date Taking? Authorizing Provider  omeprazole (PRILOSEC) 20 MG capsule Take 20 mg by mouth daily.   Yes Historical Provider, MD  cyclobenzaprine (FLEXERIL) 10 MG tablet Take 1 tablet (10 mg total) by mouth 2 (two) times daily as needed for muscle spasms. 02/15/14   Chaze Hruska Bunnie Pion, NP  ibuprofen (ADVIL,MOTRIN) 600 MG tablet Take 1 tablet (600 mg total) by mouth every 6 (six) hours as needed. 02/15/14   Jian Hodgman Bunnie Pion, NP  oxyCODONE-acetaminophen (ROXICET) 5-325 MG per tablet Take 1 tablet by mouth every 4 (four) hours as needed for severe pain. 02/15/14   Coulton Schlink Bunnie Pion, NP   BP 149/99  Pulse 93  Temp(Src) 98.1 F (36.7 C) (Oral)  Resp 18  Ht 5\' 7"  (1.702 m)  Wt 128 lb (58.06 kg)  BMI 20.04 kg/m2  SpO2 98% Physical Exam  Nursing note and vitals reviewed. Constitutional: He is oriented to person, place, and time. He appears well-developed and well-nourished.  HENT:  Head: Normocephalic and atraumatic.  Eyes: Conjunctivae and EOM are normal.  Neck: Spinous process tenderness and muscular tenderness present. No rigidity. Decreased range of motion present. No erythema present.  Cardiovascular: Tachycardia present.   Pulmonary/Chest: Effort normal. No respiratory distress. He has no rales.  Abdominal: Soft. There is no tenderness.  Musculoskeletal:  Good grips and equal bilateral. Adequate circulation, good touch sensation. Lower extremities with good strength and equal, ambulatory without difficulty.   Neurological: He is alert and oriented to person, place, and time. No  cranial nerve deficit.  Skin: Skin is warm and dry.  Psychiatric: He has a normal mood and affect. His behavior is normal.   Ct Cervical Spine Wo Contrast  02/15/2014   CLINICAL DATA:  Severe neck pain  EXAM: CT CERVICAL SPINE WITHOUT CONTRAST  TECHNIQUE: Multidetector CT imaging of the cervical spine was performed without intravenous contrast. Multiplanar CT image reconstructions were also generated.  COMPARISON:  09/07/2011  FINDINGS: Normal cervical lordosis.  No evidence of fracture or dislocation. Vertebral body heights are maintained. Dens appears intact.  No prevertebral soft tissue swelling.  Mild multilevel degenerative changes.  Visualized thyroid is unremarkable.  Visualized lung apices are notable for mild biapical pleural parenchymal scarring.  IMPRESSION: No fracture or dislocation is seen.  Mild degenerative changes.   Electronically Signed   By: Julian Hy M.D.   On: 02/15/2014 13:20    ED Course  Procedures EKG Interpretation  Date/Time:  Thursday February 15 2014 11:59:06 EDT Ventricular Rate:  105 PR Interval:  132 QRS Duration: 100 QT Interval:  340 QTC Calculation: 449 R Axis:   97 Text Interpretation:  Sinus tachycardia Rightward axis Borderline ECG Sinus tachycardia Rightward axis Artifact No significant change since last tracing Abnormal ekg Confirmed by Carmin Muskrat  MD [phone]) on 02/15/2014 12:08:56 PM   Valium give prior to CT scan. Patient states no relief. Patient given Toradol 60 mg IM and Percocet 5/325 mg. PO. Patient states that he can now move his neck without severe pain.  MDM  58 y.o. male with neck pain and muscle spasm x 3 days. Stable for discharge with decreased pain after medication. Negative CT scan and No significant change in EKG. I have reviewed this patient's vital signs, nurses notes, appropriate labs and imaging.  I have discussed findings with the patient and plan of care. He voices understanding and agrees with plan. He will return if  symptoms worsen.    Medication List    TAKE these medications       cyclobenzaprine 10 MG tablet  Commonly known as:  FLEXERIL  Take 1 tablet (10 mg total) by mouth 2 (two) times daily as needed for muscle spasms.     ibuprofen 600 MG tablet  Commonly known as:  ADVIL,MOTRIN  Take 1 tablet (600 mg total) by mouth every 6 (six) hours as needed.     oxyCODONE-acetaminophen 5-325 MG per tablet  Commonly known as:  ROXICET  Take 1 tablet by mouth every 4 (four) hours as needed for severe pain.      ASK your doctor about these medications       omeprazole 20 MG capsule  Commonly known as:  PRILOSEC  Take 20 mg by mouth daily.           [facility] Bunnie Pion, [place] 02/15/14 1415

## 2014-02-20 ENCOUNTER — Emergency Department (HOSPITAL_COMMUNITY): Payer: BC Managed Care – PPO

## 2014-02-20 ENCOUNTER — Emergency Department (HOSPITAL_COMMUNITY)
Admission: EM | Admit: 2014-02-20 | Discharge: 2014-02-21 | Disposition: A | Discharge status: Home / Self Care | Payer: BC Managed Care – PPO | Attending: Emergency Medicine | Admitting: Emergency Medicine

## 2014-02-20 DIAGNOSIS — R1013 Epigastric pain: Secondary | ICD-10-CM | POA: Insufficient documentation

## 2014-02-20 DIAGNOSIS — R0602 Shortness of breath: Secondary | ICD-10-CM | POA: Insufficient documentation

## 2014-02-20 DIAGNOSIS — Z79899 Other long term (current) drug therapy: Secondary | ICD-10-CM | POA: Insufficient documentation

## 2014-02-20 DIAGNOSIS — I252 Old myocardial infarction: Secondary | ICD-10-CM | POA: Insufficient documentation

## 2014-02-20 DIAGNOSIS — I10 Essential (primary) hypertension: Secondary | ICD-10-CM | POA: Insufficient documentation

## 2014-02-20 DIAGNOSIS — F17200 Nicotine dependence, unspecified, uncomplicated: Secondary | ICD-10-CM | POA: Insufficient documentation

## 2014-02-20 LAB — CBC WITH DIFFERENTIAL/PLATELET
Basophils Absolute: 0.1 10*3/uL (ref 0.0–0.1)
Basophils Relative: 1 % (ref 0–1)
Eosinophils Absolute: 0.1 10*3/uL (ref 0.0–0.7)
Eosinophils Relative: 2 % (ref 0–5)
HCT: 40.7 % (ref 39.0–52.0)
Hemoglobin: 14.8 g/dL (ref 13.0–17.0)
Lymphocytes Relative: 45 % (ref 12–46)
Lymphs Abs: 3.3 10*3/uL (ref 0.7–4.0)
MCH: 33.3 pg (ref 26.0–34.0)
MCHC: 36.4 g/dL — ABNORMAL HIGH (ref 30.0–36.0)
MCV: 91.7 fL (ref 78.0–100.0)
Monocytes Absolute: 0.6 10*3/uL (ref 0.1–1.0)
Monocytes Relative: 8 % (ref 3–12)
Neutro Abs: 3.3 10*3/uL (ref 1.7–7.7)
Neutrophils Relative %: 44 % (ref 43–77)
Platelets: 289 10*3/uL (ref 150–400)
RBC: 4.44 MIL/uL (ref 4.22–5.81)
RDW: 13.5 % (ref 11.5–15.5)
WBC: 7.4 10*3/uL (ref 4.0–10.5)

## 2014-02-20 LAB — BASIC METABOLIC PANEL
BUN: 4 mg/dL — ABNORMAL LOW (ref 6–23)
CO2: 24 mEq/L (ref 19–32)
Calcium: 9.3 mg/dL (ref 8.4–10.5)
Chloride: 94 mEq/L — ABNORMAL LOW (ref 96–112)
Creatinine, Ser: 0.61 mg/dL (ref 0.50–1.35)
GFR calc Af Amer: >90 mL/min (ref >90)
GFR calc non Af Amer: >90 mL/min (ref >90)
Glucose, Bld: 103 mg/dL — ABNORMAL HIGH (ref 70–99)
Potassium: 3.6 mEq/L — ABNORMAL LOW (ref 3.7–5.3)
Sodium: 135 mEq/L — ABNORMAL LOW (ref 137–147)

## 2014-02-20 LAB — LIPASE, BLOOD: LIPASE: 27 U/L (ref 11–59)

## 2014-02-20 LAB — TROPONIN I: Troponin I: <0.3 ng/mL (ref <0.30)

## 2014-02-20 LAB — ETHANOL: Alcohol, Ethyl (B): 283 mg/dL — ABNORMAL HIGH (ref 0–11)

## 2014-02-20 MED ORDER — KETOROLAC TROMETHAMINE 30 MG/ML IJ SOLN
30.0000 mg | Freq: Once | INTRAMUSCULAR | Status: AC
  Administered 2014-02-20: 30 mg via INTRAVENOUS
  Filled 2014-02-20: qty 1

## 2014-02-20 MED ORDER — MORPHINE SULFATE 4 MG/ML IJ SOLN
4.0000 mg | Freq: Once | INTRAMUSCULAR | Status: AC
  Administered 2014-02-20: 4 mg via INTRAVENOUS
  Filled 2014-02-20: qty 1

## 2014-02-20 MED ORDER — LORAZEPAM 2 MG/ML IJ SOLN
0.5000 mg | Freq: Once | INTRAMUSCULAR | Status: AC
  Administered 2014-02-20: 0.5 mg via INTRAVENOUS
  Filled 2014-02-20: qty 1

## 2014-02-20 MED ORDER — GI COCKTAIL ~~LOC~~
30.0000 mL | Freq: Once | ORAL | Status: AC
Start: 2014-02-20 — End: 2014-02-21
  Administered 2014-02-21: 30 mL via ORAL
  Filled 2014-02-20: qty 30

## 2014-02-20 NOTE — ED Provider Notes (Signed)
CSN: [id number]     Arrival date & time 02/20/14  2058 History  This chart was scribed for Veryl Speak, MD by Girtha Hake, ED Scribe. The patient was seen in [place]. The patient's care was started at 11:05 PM.    Chief Complaint  Patient presents with  . Chest Pain    Patient is a 58 y.o. male presenting with chest pain. The history is provided by the patient. No language interpreter was used.  Chest Pain Pain location:  Epigastric Pain quality: sharp   Pain severity:  Moderate Onset quality:  Sudden Duration:  5 hours Timing:  Constant Progression:  Unchanged Context comment:  Exercise - he went for a walk. Ineffective treatments:  None tried Associated symptoms: shortness of breath   Risk factors: smoking   Risk factors: no diabetes mellitus and not pregnant    HPI Comments: Douglas Stout is a 58 y.o. male with a history of smoking (2 packs/day) who presents to the Emergency Department complaining of constant, sharp CP beginning 5 hours ago. He reports associated SOB and abdominal pain. He reports that the pain began while going for a walk early this evening. He states that after going on this walk, he felt like heart was beating very fast. He reports that he had a "mild heart attack" a few years back, but did not elaborate further. Patient denies fever, chills, nausea, vomiting, and diarrhea. He reports that he typically drinks alcohol on a daily basis, and has had a "few" beers today.   Patient does not have a PCP.   Past Medical History  Diagnosis Date  . Hypertension   . Acute MI    No past surgical history on file. Family History  Problem Relation Age of Onset  . Cancer Mother    History  Substance Use Topics  . Smoking status: Current Every Day Smoker -- 2.00 packs/day for 42 years    Types: Cigarettes  . Smokeless tobacco: Never Used  . Alcohol Use: 3.6 oz/week    6 Cans of beer per week    Review of Systems  Respiratory: Positive for shortness of  breath.   Cardiovascular: Positive for chest pain.  All other systems reviewed and are negative.     Allergies  Review of patient's allergies indicates no known allergies.  Home Medications   Prior to Admission medications   Medication Sig Start Date End Date Taking? Authorizing Romana Deaton  Aspirin-Salicylamide-Caffeine (BC FAST PAIN RELIEF ARTHRITIS) [phone] MG PACK Take 1 packet by mouth daily as needed (for pain).   Yes Historical Lateisha Thurlow, MD  cyclobenzaprine (FLEXERIL) 10 MG tablet Take 1 tablet (10 mg total) by mouth 2 (two) times daily as needed for muscle spasms. 02/15/14  Yes Hope Bunnie Pion, NP  ibuprofen (ADVIL,MOTRIN) 600 MG tablet Take 1 tablet (600 mg total) by mouth every 6 (six) hours as needed. 02/15/14  Yes Hope Bunnie Pion, NP  omeprazole (PRILOSEC) 20 MG capsule Take 20 mg by mouth daily.   Yes Historical Jontae Adebayo, MD  oxyCODONE-acetaminophen (ROXICET) 5-325 MG per tablet Take 1 tablet by mouth every 4 (four) hours as needed for severe pain. 02/15/14  Yes Hope Bunnie Pion, NP   Triage Vitals: BP 166/86  Pulse 96  Temp(Src) 97.8 F (36.6 C) (Oral)  Resp 13  Ht 5\' 8"  (1.727 m)  Wt 128 lb (58.06 kg)  BMI 19.47 kg/m2  SpO2 90% Physical Exam  Nursing note and vitals reviewed. Constitutional: He is oriented to person, place,  and time. He appears well-developed and well-nourished. No distress.  HENT:  Head: Normocephalic and atraumatic.  Eyes: Conjunctivae and EOM are normal.  Neck: Neck supple. No tracheal deviation present.  Cardiovascular: Normal rate.   Pulmonary/Chest: Effort normal and breath sounds normal. No respiratory distress. He has no wheezes. He has no rales. He exhibits no tenderness.  Abdominal: Soft. Bowel sounds are normal. There is tenderness. There is no rebound and no guarding.  TTP in the epigastrium.   Musculoskeletal: Normal range of motion.  Neurological: He is alert and oriented to person, place, and time.  Skin: Skin is warm and dry.  Psychiatric:  He has a normal mood and affect. His behavior is normal.    ED Course  Procedures (including critical care time) DIAGNOSTIC STUDIES: Oxygen Saturation is 97% on room air, normal by my interpretation.    COORDINATION OF CARE: 11:12 PM-Discussed treatment plan which includes GI cocktail, Morphine, Toradol, Ativan, CXR, EKG, and labs with pt at bedside and pt agreed to plan.     Labs Review Labs Reviewed  CBC WITH DIFFERENTIAL - Abnormal; Notable for the following:    MCHC 36.4 (*)    All other components within normal limits  BASIC METABOLIC PANEL - Abnormal; Notable for the following:    Sodium 135 (*)    Potassium 3.6 (*)    Chloride 94 (*)    Glucose, Bld 103 (*)    BUN 4 (*)    All other components within normal limits  TROPONIN I    Imaging Review Dg Chest 2 View  02/20/2014   CLINICAL DATA:  Chest pain, history of smoking.  EXAM: CHEST  2 VIEW  COMPARISON:  Chest radiograph June 29, 2013  FINDINGS: Cardiomediastinal silhouette is unremarkable. The lungs are clear without pleural effusions or focal consolidations. Increased lung volumes with flattening of the hemidiaphragms. Trachea projects midline and there is no pneumothorax. Soft tissue planes and included osseous structures are non-suspicious. Moderate chronic approximate T12 compression fracture. Remote left lateral rib fractures.  IMPRESSION: COPD without superimposed acute cardiopulmonary process.   Electronically Signed   By: Elon Alas   On: 02/20/2014 22:45     EKG Interpretation   Date/Time:  Tuesday February 20 2014 20:59:09 EDT Ventricular Rate:  108 PR Interval:  154 QRS Duration: 100 QT Interval:  336 QTC Calculation: 450 R Axis:   82 Text Interpretation:  Sinus tachycardia Possible Left atrial enlargement  No significant change since last tracing 15 February 2014 Confirmed by [facility]   MD-I, IVA ([id number]) on 02/20/2014 10:08:55 PM      MDM   Final diagnoses:  None    Patient is a 58 year old  male with history of hypertension and chronic alcoholism. He presents today with complaints of pain across his upper abdomen and chest. His discomfort does not sound cardiac in his EKG and troponin x2 is negative. He tells me he has had a heart attack in the past, however has never had a heart catheterization and I can find nothing in the chart that states this.  He is tender to palpation in the epigastrium and has the odor of alcohol present. A lipase and LFTs are unremarkable, however his blood alcohol is 283. He is feeling better with medications given in the ER and I believe is appropriate for discharge. There is no hypoxia or tachycardia to suggest PE, and he has no risk factors for this.  He will be treated with Prilosec. He is also demanding medication  for his discomfort. I will prescribe him a limited quantity of Percocet which he can feel in the morning.  I personally performed the services described in this documentation, which was scribed in my presence. The recorded information has been reviewed and is accurate.      Veryl Speak, MD 02/21/14 [phone]

## 2014-02-20 NOTE — ED Notes (Addendum)
Friend with him states he was fine until 15 min ago when he started c/o left chest pain. Friend does not know how much he has had to drink to day. Upon arrival to ED pt reportedly stumbled at ED entrance. States he did not hurt himself.pt admits to having a "couple of beers" today. To triage where EKG was done then to ED rm 9 via stretcher

## 2014-02-21 LAB — TROPONIN I

## 2014-02-21 MED ORDER — OXYCODONE-ACETAMINOPHEN 5-325 MG PO TABS: 1.0000 | ORAL_TABLET | ORAL | Status: DC | PRN

## 2014-02-21 NOTE — ED Notes (Addendum)
Pt c/o 10/10 chest pain. Pt appears intoxicated and admits to drinking a 24 pack of beer today. Kohut MD was notified and came to bedside for evaluation.  Pt then pulled his IV out and urinated on the floor.  IV catheter was intact. New PIV was inserted and pt was medicated as ordered.

## 2014-02-21 NOTE — Discharge Instructions (Signed)
Prilosec 20 mg twice daily for the next 2 weeks.  Percocet as needed for pain.  Followup with your primary Dr. in the next week, and return to the ER if your symptoms substantially worsen or change.   Abdominal Pain Many things can cause abdominal pain. Usually, abdominal pain is not caused by a disease and will improve without treatment. It can often be observed and treated at home. Your health care provider will do a physical exam and possibly order blood tests and X-rays to help determine the seriousness of your pain. However, in many cases, more time must pass before a clear cause of the pain can be found. Before that point, your health care provider may not know if you need more testing or further treatment. HOME CARE INSTRUCTIONS  Monitor your abdominal pain for any changes. The following actions may help to alleviate any discomfort you are experiencing:  Only take over-the-counter or prescription medicines as directed by your health care provider.  Do not take laxatives unless directed to do so by your health care provider.  Try a clear liquid diet (broth, tea, or water) as directed by your health care provider. Slowly move to a bland diet as tolerated. SEEK MEDICAL CARE IF:  You have unexplained abdominal pain.  You have abdominal pain associated with nausea or diarrhea.  You have pain when you urinate or have a bowel movement.  You experience abdominal pain that wakes you in the night.  You have abdominal pain that is worsened or improved by eating food.  You have abdominal pain that is worsened with eating fatty foods.  You have a fever. SEEK IMMEDIATE MEDICAL CARE IF:   Your pain does not go away within 2 hours.  You keep throwing up (vomiting).  Your pain is felt only in portions of the abdomen, such as the right side or the left lower portion of the abdomen.  You pass bloody or black tarry stools. MAKE SURE YOU:  Understand these instructions.   Will watch  your condition.   Will get help right away if you are not doing well or get worse.  Document Released: 05/27/2005 Document Revised: 08/22/2013 Document Reviewed: 04/26/2013 [facility] Patient Information 2015 [place], [place]. This information is not intended to replace advice given to you by your health care provider. Make sure you discuss any questions you have with your health care provider.

## 2014-02-21 NOTE — ED Notes (Signed)
Pt said he lives 2 blocks away and plans to walk home. Pt ambulated with steady gait.

## 2014-04-27 ENCOUNTER — Encounter (HOSPITAL_COMMUNITY): Payer: Self-pay | Admitting: Emergency Medicine

## 2014-04-27 ENCOUNTER — Emergency Department (HOSPITAL_COMMUNITY): Payer: No Typology Code available for payment source

## 2014-04-27 ENCOUNTER — Emergency Department (HOSPITAL_COMMUNITY)
Admission: EM | Admit: 2014-04-27 | Discharge: 2014-04-27 | Disposition: A | Discharge status: Home / Self Care | Payer: No Typology Code available for payment source | Attending: Emergency Medicine | Admitting: Emergency Medicine

## 2014-04-27 DIAGNOSIS — I252 Old myocardial infarction: Secondary | ICD-10-CM | POA: Insufficient documentation

## 2014-04-27 DIAGNOSIS — F17200 Nicotine dependence, unspecified, uncomplicated: Secondary | ICD-10-CM | POA: Diagnosis not present

## 2014-04-27 DIAGNOSIS — S139XXA Sprain of joints and ligaments of unspecified parts of neck, initial encounter: Secondary | ICD-10-CM | POA: Insufficient documentation

## 2014-04-27 DIAGNOSIS — S335XXA Sprain of ligaments of lumbar spine, initial encounter: Secondary | ICD-10-CM | POA: Insufficient documentation

## 2014-04-27 DIAGNOSIS — W19XXXA Unspecified fall, initial encounter: Secondary | ICD-10-CM

## 2014-04-27 DIAGNOSIS — S161XXA Strain of muscle, fascia and tendon at neck level, initial encounter: Secondary | ICD-10-CM

## 2014-04-27 DIAGNOSIS — R079 Chest pain, unspecified: Secondary | ICD-10-CM

## 2014-04-27 DIAGNOSIS — S298XXA Other specified injuries of thorax, initial encounter: Secondary | ICD-10-CM | POA: Insufficient documentation

## 2014-04-27 DIAGNOSIS — Y9289 Other specified places as the place of occurrence of the external cause: Secondary | ICD-10-CM | POA: Insufficient documentation

## 2014-04-27 DIAGNOSIS — W108XXA Fall (on) (from) other stairs and steps, initial encounter: Secondary | ICD-10-CM | POA: Insufficient documentation

## 2014-04-27 DIAGNOSIS — S39012A Strain of muscle, fascia and tendon of lower back, initial encounter: Secondary | ICD-10-CM

## 2014-04-27 DIAGNOSIS — I10 Essential (primary) hypertension: Secondary | ICD-10-CM | POA: Diagnosis not present

## 2014-04-27 DIAGNOSIS — Y9389 Activity, other specified: Secondary | ICD-10-CM | POA: Insufficient documentation

## 2014-04-27 DIAGNOSIS — IMO0002 Reserved for concepts with insufficient information to code with codable children: Secondary | ICD-10-CM | POA: Insufficient documentation

## 2014-04-27 LAB — COMPREHENSIVE METABOLIC PANEL
ALBUMIN: 3.5 g/dL (ref 3.5–5.2)
ALT: 11 U/L (ref 0–53)
ANION GAP: 16 — AB (ref 5–15)
AST: 24 U/L (ref 0–37)
Alkaline Phosphatase: 47 U/L (ref 39–117)
BILIRUBIN TOTAL: 0.3 mg/dL (ref 0.3–1.2)
BUN: 5 mg/dL — ABNORMAL LOW (ref 6–23)
CHLORIDE: 101 meq/L (ref 96–112)
CO2: 22 mEq/L (ref 19–32)
CREATININE: 0.72 mg/dL (ref 0.50–1.35)
Calcium: 8.6 mg/dL (ref 8.4–10.5)
GFR calc Af Amer: >90 mL/min (ref >90)
GFR calc non Af Amer: >90 mL/min (ref >90)
Glucose, Bld: 96 mg/dL (ref 70–99)
Potassium: 3.2 mEq/L — ABNORMAL LOW (ref 3.7–5.3)
Sodium: 139 mEq/L (ref 137–147)
TOTAL PROTEIN: 7 g/dL (ref 6.0–8.3)

## 2014-04-27 LAB — CBC WITH DIFFERENTIAL/PLATELET
BASOS PCT: 1 % (ref 0–1)
Basophils Absolute: 0.1 10*3/uL (ref 0.0–0.1)
Eosinophils Absolute: 0.2 10*3/uL (ref 0.0–0.7)
Eosinophils Relative: 2 % (ref 0–5)
HCT: 37.9 % — ABNORMAL LOW (ref 39.0–52.0)
Hemoglobin: 13.6 g/dL (ref 13.0–17.0)
LYMPHS PCT: 42 % (ref 12–46)
Lymphs Abs: 3 10*3/uL (ref 0.7–4.0)
MCH: 32.8 pg (ref 26.0–34.0)
MCHC: 35.9 g/dL (ref 30.0–36.0)
MCV: 91.3 fL (ref 78.0–100.0)
Monocytes Absolute: 0.6 10*3/uL (ref 0.1–1.0)
Monocytes Relative: 8 % (ref 3–12)
NEUTROS ABS: 3.4 10*3/uL (ref 1.7–7.7)
Neutrophils Relative %: 47 % (ref 43–77)
PLATELETS: 218 10*3/uL (ref 150–400)
RBC: 4.15 MIL/uL — ABNORMAL LOW (ref 4.22–5.81)
RDW: 12.9 % (ref 11.5–15.5)
WBC: 7.1 10*3/uL (ref 4.0–10.5)

## 2014-04-27 LAB — TROPONIN I

## 2014-04-27 MED ORDER — HYDROCODONE-ACETAMINOPHEN 5-325 MG PO TABS
2.0000 | ORAL_TABLET | Freq: Once | ORAL | Status: AC
  Administered 2014-04-27: 2 via ORAL
  Filled 2014-04-27: qty 2

## 2014-04-27 MED ORDER — POTASSIUM CHLORIDE CRYS ER 20 MEQ PO TBCR
40.0000 meq | EXTENDED_RELEASE_TABLET | Freq: Once | ORAL | Status: AC
  Administered 2014-04-27: 40 meq via ORAL
  Filled 2014-04-27: qty 2

## 2014-04-27 MED ORDER — IBUPROFEN 800 MG PO TABS
800.0000 mg | ORAL_TABLET | Freq: Once | ORAL | Status: AC
  Administered 2014-04-27: 800 mg via ORAL
  Filled 2014-04-27: qty 1

## 2014-04-27 NOTE — ED Notes (Signed)
Demanding to have "something for pain now or I'm walking out". Pt understands that the ERMD needs to see him first. Dr Edward Jolly aware

## 2014-04-27 NOTE — ED Notes (Addendum)
unwitnessed fall from ladder,2 steps up. States he had a misstep and fell over backwards landing on buttocks. Started having chestpain after fall. Walked here. Now denies any chest pain but is c/o 10/10 mid neck pain. No radiation. Neck collar applied. Pt noncompliant with request to not move head. Good circ,sensation and movement all extremities

## 2014-04-27 NOTE — Discharge Instructions (Signed)
Ibuprofen and tylenol as needed for pain.  If you were given medicines take as directed.  If you are on coumadin or contraceptives realize their levels and effectiveness is altered by many different medicines.  If you have any reaction (rash, tongues swelling, other) to the medicines stop taking and see a physician.   Please follow up as directed and return to the ER or see a physician for new or worsening symptoms.  Thank you. Filed Vitals:   04/27/14 1818  BP: 120/61  Pulse: 98  Temp: 98.6 F (37 C)  TempSrc: Oral  Resp: 18  Height: 5\' 8"  (1.727 m)  Weight: 128 lb (58.06 kg)  SpO2: 99%

## 2014-04-27 NOTE — ED Provider Notes (Signed)
CSN: [id number]     Arrival date & time 04/27/14  1807 History   First MD Initiated Contact with Patient 04/27/14 1907     Chief Complaint  Patient presents with  . Chest Pain     (Consider location/radiation/quality/duration/timing/severity/associated sxs/prior Treatment) HPI Comments: 58 year old male with history of high blood pressure, heart attack, heavy smoker, regular alcohol use presents with neck, back and chest pain since fall prior to arrival. Patient fell because he missed the last step and landed on his buttocks and lower back. Patient denies head injury or loss of consciousness. Pain with palpation range of motion of neck and lower back. Patient denies neuro symptoms. Chest pain is anterior bilateral worse palpation. No significant radiation. No shortness of breath. Patient denies blood thinner use.  Patient walked here and recalls events.  The history is provided by the patient.    Past Medical History  Diagnosis Date  . Hypertension   . Acute MI    History reviewed. No pertinent past surgical history. Family History  Problem Relation Age of Onset  . Cancer Mother    History  Substance Use Topics  . Smoking status: Current Every Day Smoker -- 2.00 packs/day for 42 years    Types: Cigarettes  . Smokeless tobacco: Never Used  . Alcohol Use: 3.6 oz/week    6 Cans of beer per week    Review of Systems  Constitutional: Negative for fever and chills.  HENT: Negative for congestion.   Eyes: Negative for visual disturbance.  Respiratory: Negative for shortness of breath.   Cardiovascular: Positive for chest pain.  Gastrointestinal: Negative for vomiting and abdominal pain.  Genitourinary: Negative for dysuria and flank pain.  Musculoskeletal: Positive for arthralgias, back pain and neck pain. Negative for neck stiffness.  Skin: Negative for rash.  Neurological: Negative for light-headedness and headaches.      Allergies  Review of patient's allergies indicates  no known allergies.  Home Medications   Prior to Admission medications   Medication Sig Start Date End Date Taking? Authorizing Provider  Aspirin-Salicylamide-Caffeine (BC FAST PAIN RELIEF ARTHRITIS) [phone] MG PACK Take 1 packet by mouth daily as needed (for pain).   Yes Historical Provider, MD   BP 120/61  Pulse 98  Temp(Src) 98.6 F (37 C) (Oral)  Resp 18  Ht 5\' 8"  (1.727 m)  Wt 128 lb (58.06 kg)  BMI 19.47 kg/m2  SpO2 99% Physical Exam  Nursing note and vitals reviewed. Constitutional: He is oriented to person, place, and time. He appears well-developed and well-nourished.  HENT:  Head: Normocephalic and atraumatic.  Eyes: Conjunctivae are normal. Right eye exhibits no discharge. Left eye exhibits no discharge.  Neck: Normal range of motion. Neck supple. No tracheal deviation present.  Cardiovascular: Normal rate and regular rhythm.   Pulmonary/Chest: Effort normal and breath sounds normal.  Abdominal: Soft. He exhibits no distension. There is no tenderness. There is no guarding.  Musculoskeletal: He exhibits tenderness. He exhibits no edema.  Patient has mild tenderness lower midline cervical and lower midline lumbar without step-off. C. collar in place however patient moving his head and neck despite.  Neurological: He is alert and oriented to person, place, and time. GCS eye subscore is 4. GCS verbal subscore is 5. GCS motor subscore is 6.  Reflex Scores:      Patellar reflexes are 2+ on the right side and 2+ on the left side.      Achilles reflexes are 2+ on the right side and 2+  on the left side. 5+ strength in UE and LE with f/e at major joints. Sensation to palpation intact in UE and LE. CNs 2-12 grossly intact.  EOMFI.  PERRL.       Skin: Skin is warm. No rash noted.  Psychiatric: He has a normal mood and affect.    ED Course  Procedures (including critical care time) Labs Review Labs Reviewed  CBC WITH DIFFERENTIAL - Abnormal; Notable for the following:     RBC 4.15 (*)    HCT 37.9 (*)    All other components within normal limits  COMPREHENSIVE METABOLIC PANEL - Abnormal; Notable for the following:    Potassium 3.2 (*)    BUN 5 (*)    Anion gap 16 (*)    All other components within normal limits  TROPONIN I    Imaging Review Dg Chest 2 View  04/27/2014   CLINICAL DATA:  Left upper and anterior chest pain after falling from a ladder today.  EXAM: CHEST  2 VIEW  COMPARISON:  Radiographs 02/20/2014 and 06/29/2013.  FINDINGS: The heart size and mediastinal contours are stable without evidence of mediastinal hematoma. The lungs are clear. There is no pleural effusion or pneumothorax.  No definite acute fractures are identified. An inferior endplate compression deformity at approximately T12 is stable. There are probable old left-sided rib fractures.  IMPRESSION: No acute cardiopulmonary process or definite acute fractures.   Electronically Signed   By: Camie Patience M.D.   On: 04/27/2014 18:36   Dg Lumbar Spine Complete  04/27/2014   CLINICAL DATA:  Back pain.  EXAM: LUMBAR SPINE - COMPLETE 4+ VIEW  COMPARISON:  CT 11/24/2011.  FINDINGS: Paraspinal soft tissues are normal. Diffuse degenerative change lumbar spine. No evidence of acute fracture. Old T12 compression fracture. Normal mineralization and alignment. Aortoiliac atherosclerotic vascular disease  IMPRESSION: 1. Old T12 compression fracture. 2. Diffuse degenerative change.  No acute abnormality. 3. Aortoiliac atherosclerotic vascular disease.   Electronically Signed   By: Marcello Moores  Register   On: 04/27/2014 20:48   Ct Cervical Spine Wo Contrast  04/27/2014   CLINICAL DATA:  Fall.  Neck pain.  EXAM: CT CERVICAL SPINE WITHOUT CONTRAST  TECHNIQUE: Multidetector CT imaging of the cervical spine was performed without intravenous contrast. Multiplanar CT image reconstructions were also generated.  COMPARISON:  Cervical spine radiographs 02/15/2014  FINDINGS: Cervical spine is visualized from skull base  through T1-2. Vertebral body heights and alignment are normal. No acute fracture or traumatic subluxation is evident. Mild degenerative changes are stable.  Atherosclerotic calcifications are present within the carotid bifurcations bilaterally. The lung apices are clear.  IMPRESSION: 1. No acute abnormality. 2. Stable degenerative change.   Electronically Signed   By: Lawrence Santiago M.D.   On: 04/27/2014 20:50     EKG Interpretation None     EKG reviewed heart rate 95, normal QT, normal axis, no acute ST elevation or ST depression, no acute findings, sinus MDM   Final diagnoses:  Chest pain, unspecified chest pain type  Lumbar strain, initial encounter  Fall, initial encounter  Cervical strain, acute, initial encounter  Hypokalemia  Clinically musculoskeletal neck, chest and back pain. With cardiac history plan for screening troponin EKG which were reviewed. Pain meds given and x-ray and CTs pending.  Patient improved in ER. X-rays reviewed no acute fractures. Blood work mild low potassium oral potassium given. Outpatient followup recommended. Results and differential diagnosis were discussed with the patient/parent/guardian. Close follow up outpatient was discussed, comfortable  with the plan.   Medications  HYDROcodone-acetaminophen (NORCO/VICODIN) 5-325 MG per tablet 2 tablet (not administered)  ibuprofen (ADVIL,MOTRIN) tablet 800 mg (not administered)    Filed Vitals:   04/27/14 1818  BP: 120/61  Pulse: 98  Temp: 98.6 F (37 C)  TempSrc: Oral  Resp: 18  Height: 5\' 8"  (1.727 m)  Weight: 128 lb (58.06 kg)  SpO2: 99%       Mariea Clonts, MD 04/27/14 2126

## 2014-04-27 NOTE — ED Notes (Addendum)
Chest pain with sob, onset after coming down a ladder after painting.  Says he fell down from last 2 steps on ladder. And then had onset of pain. Walked to ER

## 2014-08-20 ENCOUNTER — Emergency Department (HOSPITAL_COMMUNITY)
Admission: EM | Admit: 2014-08-20 | Discharge: 2014-08-21 | Disposition: A | Discharge status: Home / Self Care | Payer: No Typology Code available for payment source | Attending: Emergency Medicine | Admitting: Emergency Medicine

## 2014-08-20 ENCOUNTER — Encounter (HOSPITAL_COMMUNITY): Payer: Self-pay | Admitting: Emergency Medicine

## 2014-08-20 ENCOUNTER — Emergency Department (HOSPITAL_COMMUNITY): Payer: No Typology Code available for payment source

## 2014-08-20 DIAGNOSIS — R0789 Other chest pain: Secondary | ICD-10-CM | POA: Insufficient documentation

## 2014-08-20 DIAGNOSIS — F329 Major depressive disorder, single episode, unspecified: Secondary | ICD-10-CM | POA: Diagnosis not present

## 2014-08-20 DIAGNOSIS — Z791 Long term (current) use of non-steroidal anti-inflammatories (NSAID): Secondary | ICD-10-CM | POA: Diagnosis not present

## 2014-08-20 DIAGNOSIS — Z720 Tobacco use: Secondary | ICD-10-CM | POA: Insufficient documentation

## 2014-08-20 DIAGNOSIS — R079 Chest pain, unspecified: Secondary | ICD-10-CM | POA: Diagnosis present

## 2014-08-20 DIAGNOSIS — F1010 Alcohol abuse, uncomplicated: Secondary | ICD-10-CM | POA: Diagnosis not present

## 2014-08-20 DIAGNOSIS — R197 Diarrhea, unspecified: Secondary | ICD-10-CM | POA: Insufficient documentation

## 2014-08-20 DIAGNOSIS — E860 Dehydration: Secondary | ICD-10-CM

## 2014-08-20 DIAGNOSIS — Z79899 Other long term (current) drug therapy: Secondary | ICD-10-CM | POA: Insufficient documentation

## 2014-08-20 DIAGNOSIS — I252 Old myocardial infarction: Secondary | ICD-10-CM | POA: Insufficient documentation

## 2014-08-20 DIAGNOSIS — I10 Essential (primary) hypertension: Secondary | ICD-10-CM | POA: Diagnosis not present

## 2014-08-20 DIAGNOSIS — R112 Nausea with vomiting, unspecified: Secondary | ICD-10-CM | POA: Insufficient documentation

## 2014-08-20 DIAGNOSIS — F32A Depression, unspecified: Secondary | ICD-10-CM

## 2014-08-20 LAB — BASIC METABOLIC PANEL
Anion gap: 20 — ABNORMAL HIGH (ref 5–15)
BUN: 5 mg/dL — ABNORMAL LOW (ref 6–23)
CHLORIDE: 87 meq/L — AB (ref 96–112)
CO2: 20 meq/L (ref 19–32)
CREATININE: 0.58 mg/dL (ref 0.50–1.35)
Calcium: 8.7 mg/dL (ref 8.4–10.5)
GFR calc Af Amer: >90 mL/min (ref >90)
GFR calc non Af Amer: >90 mL/min (ref >90)
GLUCOSE: 165 mg/dL — AB (ref 70–99)
POTASSIUM: 3.6 meq/L — AB (ref 3.7–5.3)
Sodium: 127 mEq/L — ABNORMAL LOW (ref 137–147)

## 2014-08-20 LAB — CBC
HEMATOCRIT: 38.5 % — AB (ref 39.0–52.0)
Hemoglobin: 13.8 g/dL (ref 13.0–17.0)
MCH: 32.9 pg (ref 26.0–34.0)
MCHC: 35.8 g/dL (ref 30.0–36.0)
MCV: 91.9 fL (ref 78.0–100.0)
Platelets: 147 10*3/uL — ABNORMAL LOW (ref 150–400)
RBC: 4.19 MIL/uL — AB (ref 4.22–5.81)
RDW: 14.4 % (ref 11.5–15.5)
WBC: 6.9 10*3/uL (ref 4.0–10.5)

## 2014-08-20 LAB — ETHANOL: ALCOHOL ETHYL (B): 337 mg/dL — AB (ref 0–11)

## 2014-08-20 LAB — TROPONIN I: Troponin I: <0.3 ng/mL (ref <0.30)

## 2014-08-20 MED ORDER — SODIUM CHLORIDE 0.9 % IV SOLN
1000.0000 mL | INTRAVENOUS | Status: DC
  Administered 2014-08-21: 1000 mL via INTRAVENOUS

## 2014-08-20 MED ORDER — SODIUM CHLORIDE 0.9 % IV SOLN
1000.0000 mL | Freq: Once | INTRAVENOUS | Status: AC
  Administered 2014-08-20: 1000 mL via INTRAVENOUS

## 2014-08-20 MED ORDER — ONDANSETRON HCL 4 MG/2ML IJ SOLN
4.0000 mg | Freq: Once | INTRAMUSCULAR | Status: AC
  Administered 2014-08-20: 4 mg via INTRAVENOUS
  Filled 2014-08-20: qty 2

## 2014-08-20 MED ORDER — SODIUM CHLORIDE 0.9 % IV SOLN
1000.0000 mL | Freq: Once | INTRAVENOUS | Status: AC
  Administered 2014-08-21: 1000 mL via INTRAVENOUS

## 2014-08-20 MED ORDER — NITROGLYCERIN 2 % TD OINT
1.0000 [in_us] | TOPICAL_OINTMENT | Freq: Once | TRANSDERMAL | Status: AC
  Administered 2014-08-20: 1 [in_us] via TOPICAL
  Filled 2014-08-20: qty 1

## 2014-08-20 NOTE — ED Notes (Signed)
Per EMS: pt c/o chest pain x 30 min. Initial BP 200/113. Given 324 mg of ASA, 2 Nitro enroute.

## 2014-08-20 NOTE — ED Notes (Signed)
MD at bedside. 

## 2014-08-20 NOTE — ED Notes (Signed)
Pt reports drinking "a 40" tonight.

## 2014-08-20 NOTE — ED Notes (Signed)
Patient states that he is still having sharp stabbing pains that comes and goes. States that his left arm feels numb sometimes.

## 2014-08-20 NOTE — ED Provider Notes (Signed)
CSN: [id number]     Arrival date & time 08/20/14  2145 History   None    This chart was scribed for Janice Norrie, MD by Forrestine Him, ED Scribe. This patient was seen in room APA10/APA10 and the patient's care was started 6:38 AM.   Chief Complaint  Patient presents with  . Chest Pain   HPI   HPI Comments: Douglas Stout brought in by EMS is a 58 y.o. male with a PMHx of acute MI and HTN who presents to the Emergency Department complaining of intermittent L sided chest pain with associated SOB, diaphoresis, and nausea while at a friends house x 30 minutes prior to arrival. He describes pain as stabbing. Episodes last for 3-4 minutes when they come. Pt was given a dose of Nitro and ASA en route to ED with improvement for discomfort. He also reports numbness to his L upper arm intermittiently. Pt reports a previous episode of above set of symptoms 6 years ago and was told he may have had a slight MI. He denies seeing a cardiologist or having a stent placed. He has not eaten in 2-3 days secondary to depression. States "I have a whole lot of grief". States when he eats he vomits which he says has been ongoing for the last few days. He mentions mild lower abdominal pain with associated diarrhea. He reports 1 episode daily. No aggravating or alleviating factors. No SI at this time. He is an every day smoker and smokes approximately 2-3 packs of cigarettes daily. Pt admits to drinking 3-4 beer daily. He states his niece is arranging for him to go to detox next week. He denies withdrawal symptoms when he stops drinking. 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  Pt is not currently followed by a Cardiologist PCP none  Past Medical History  Diagnosis Date  . Hypertension   . Acute MI    History reviewed. No pertinent past surgical history. Family History  Problem Relation Age of Onset  . Cancer Mother    History  Substance Use Topics  . Smoking status: Current Every  Day Smoker -- 2.00 packs/day for 42 years    Types: Cigarettes  . Smokeless tobacco: Never Used  . Alcohol Use: 3.6 oz/week    6 Cans of beer per week  unemployed Widowed Drinks 120 ounces of beer daily Smokes 2-3 ppd  Review of Systems  Constitutional: Negative for fever and chills.  Cardiovascular: Positive for chest pain.  Gastrointestinal: Positive for nausea, vomiting, abdominal pain and diarrhea.  All other systems reviewed and are negative.     Allergies  Review of patient's allergies indicates no known allergies.  Home Medications   Prior to Admission medications   Medication Sig Start Date End Date Taking? Authorizing Provider  Aspirin-Salicylamide-Caffeine (BC FAST PAIN RELIEF ARTHRITIS) [phone] MG PACK Take 1 packet by mouth daily as needed (for pain).    Historical Provider, MD  cyclobenzaprine (FLEXERIL) 5 MG tablet Take 1 tablet (5 mg total) by mouth 3 (three) times daily as needed. 08/21/14   Janice Norrie, MD  naproxen (NAPROSYN) 250 MG tablet Take 1 tablet (250 mg total) by mouth 2 (two) times daily. 08/21/14   Janice Norrie, MD   Triage Vitals: BP 125/87 mmHg  Pulse 105  Temp(Src) 97.6 F (36.4 C) (Oral)  Resp 16  Ht 5\' 8"  (1.727 m)  Wt 120 lb (54.432 kg)  BMI 18.25 kg/m2  SpO2 96%  Laboratory interpretation all normal except except for tachycardia    Physical Exam  Constitutional: He is oriented to person, place, and time.  Non-toxic appearance. He does not appear ill. No distress.  Thin male  HENT:  Head: Normocephalic and atraumatic.  Right Ear: External ear normal.  Left Ear: External ear normal.  Nose: Nose normal. No mucosal edema or rhinorrhea.  Mouth/Throat: Oropharynx is clear and moist and mucous membranes are normal. No dental abscesses or uvula swelling.  Eyes: Conjunctivae and EOM are normal. Pupils are equal, round, and reactive to light.  Neck: Normal range of motion and full passive range of motion without pain. Neck supple.   Cardiovascular: Normal rate, regular rhythm and normal heart sounds.  Exam reveals no gallop and no friction rub.   No murmur heard. Pulmonary/Chest: Effort normal and breath sounds normal. No respiratory distress. He has no wheezes. He has no rhonchi. He has no rales. He exhibits no tenderness and no crepitus.  Holding his hand over his left chest  Abdominal: Soft. Normal appearance and bowel sounds are normal. He exhibits no distension. There is tenderness. There is no rebound and no guarding.  Suprapubic tenderness  Musculoskeletal: Normal range of motion. He exhibits no edema or tenderness.  Moves all extremities well.   Neurological: He is alert and oriented to person, place, and time. He has normal strength. No cranial nerve deficit.  Skin: Skin is warm, dry and intact. No rash noted. No erythema. No pallor.  Psychiatric: He has a normal mood and affect. His speech is normal and behavior is normal. His mood appears not anxious.  Nursing note and vitals reviewed.   ED Course  Procedures (including critical care time)  Medications  0.9 %  sodium chloride infusion (0 mLs Intravenous Stopped 08/21/14 0126)    Followed by  0.9 %  sodium chloride infusion (0 mLs Intravenous Stopped 08/21/14 0500)    Followed by  0.9 %  sodium chloride infusion (1,000 mLs Intravenous New Bag/Given 08/21/14 0534)  nitroGLYCERIN (NITROGLYN) 2 % ointment 1 inch (1 inch Topical Given 08/20/14 2346)  ondansetron (ZOFRAN) injection 4 mg (4 mg Intravenous Given 08/20/14 2345)  ketorolac (TORADOL) 30 MG/ML injection 30 mg (30 mg Intravenous Given 08/21/14 0126)     DIAGNOSTIC STUDIES: Oxygen Saturation is 96% on RA, adequate by my interpretation.    COORDINATION OF CARE: 10:59 PM-Discussed treatment plan with pt at bedside and pt agreed to plan.    Patient was noted to have low sodium and chloride consistent with dehydration. He was given IV fluids and his electrolytes were repeated and his anion gap  improved from 20-17.  3:20 AM  patient sleeping, have to shake his leg to wake him up. He  Immediately states he's having more pain.  Patient has slept throughout his ED visit. I'm getting a third troponin on him and if it's negative he's going to be discharged.   Labs Review Results for orders placed or performed during the hospital encounter of 08/20/14  CBC  Result Value Ref Range   WBC 6.9 4.0 - 10.5 K/uL   RBC 4.19 (L) 4.22 - 5.81 MIL/uL   Hemoglobin 13.8 13.0 - 17.0 g/dL   HCT 38.5 (L) 39.0 - 52.0 %   MCV 91.9 78.0 - 100.0 fL   MCH 32.9 26.0 - 34.0 pg   MCHC 35.8 30.0 - 36.0 g/dL   RDW 14.4 11.5 - 15.5 %   Platelets 147 (L) 150 - 400 K/uL  Basic  metabolic panel  Result Value Ref Range   Sodium 127 (L) 137 - 147 mEq/L   Potassium 3.6 (L) 3.7 - 5.3 mEq/L   Chloride 87 (L) 96 - 112 mEq/L   CO2 20 19 - 32 mEq/L   Glucose, Bld 165 (H) 70 - 99 mg/dL   BUN 5 (L) 6 - 23 mg/dL   Creatinine, Ser 0.58 0.50 - 1.35 mg/dL   Calcium 8.7 8.4 - 10.5 mg/dL   GFR calc non Af Amer >90 >90 mL/min   GFR calc Af Amer >90 >90 mL/min   Anion gap 20 (H) 5 - 15  Troponin I (MHP)  Result Value Ref Range   Troponin I <0.30 <0.30 ng/mL  Ethanol  Result Value Ref Range   Alcohol, Ethyl (B) 337 (H) 0 - 11 mg/dL  Troponin I  Result Value Ref Range   Troponin I <0.03 <0.031 ng/mL  Troponin I  Result Value Ref Range   Troponin I <0.03 <0.031 ng/mL  I-Stat Chem 8, ED  Result Value Ref Range   Sodium 135 135 - 145 mmol/L   Potassium 3.4 (L) 3.5 - 5.1 mmol/L   Chloride 100 96 - 112 mEq/L   BUN <3 (L) 6 - 23 mg/dL   Creatinine, Ser 0.80 0.50 - 1.35 mg/dL   Glucose, Bld 79 70 - 99 mg/dL   Calcium, Ion 1.05 (L) 1.12 - 1.23 mmol/L   TCO2 18 0 - 100 mmol/L   Hemoglobin 13.6 13.0 - 17.0 g/dL   HCT 40.0 39.0 - 52.0 %  AG 17      Laboratory interpretation all normal except alcohol intoxication, hyponatremia with mild hypokalemia, and a low chloride consistent with dehydration,  hyperglycemia   Imaging Review Dg Chest Port 1 View  08/20/2014   CLINICAL DATA:  Acute onset of chest pain.  Initial encounter.  EXAM: PORTABLE CHEST - 1 VIEW  COMPARISON:  Chest radiograph performed 04/27/2014  FINDINGS: The lungs are well-aerated. Pulmonary vascularity is at the upper limits of normal. There is no evidence of focal opacification, pleural effusion or pneumothorax.  The cardiomediastinal silhouette is within normal limits. No acute osseous abnormalities are seen.  IMPRESSION: No acute cardiopulmonary process seen.   Electronically Signed   By: Garald Balding M.D.   On: 08/20/2014 22:33     EKG Interpretation   Date/Time:  Monday August 20 2014 21:51:54 EST Ventricular Rate:  105 PR Interval:  145 QRS Duration: 93 QT Interval:  329 QTC Calculation: 435 R Axis:   94 Text Interpretation:  Sinus tachycardia Borderline right axis deviation  Confirmed by ZAVITZ  MD, JOSHUA ([id number]) on 08/20/2014 10:04:48 PM      MDM   Final diagnoses:  Atypical chest pain  Dehydration  Alcohol abuse  Depression   New Prescriptions   CYCLOBENZAPRINE (FLEXERIL) 5 MG TABLET    Take 1 tablet (5 mg total) by mouth 3 (three) times daily as needed.   NAPROXEN (NAPROSYN) 250 MG TABLET    Take 1 tablet (250 mg total) by mouth 2 (two) times daily.    Plan discharge  Rolland Porter, MD, FACEP   I personally performed the services described in this documentation, which was scribed in my presence. The recorded information has been reviewed and considered.  Rolland Porter, MD, Abram Sander     Janice Norrie, MD 08/21/14 [phone]

## 2014-08-21 LAB — I-STAT CHEM 8, ED
BUN: <3 mg/dL — ABNORMAL LOW (ref 6–23)
CALCIUM ION: 1.05 mmol/L — AB (ref 1.12–1.23)
Chloride: 100 mEq/L (ref 96–112)
Creatinine, Ser: 0.8 mg/dL (ref 0.50–1.35)
Glucose, Bld: 79 mg/dL (ref 70–99)
HEMATOCRIT: 40 % (ref 39.0–52.0)
Hemoglobin: 13.6 g/dL (ref 13.0–17.0)
Potassium: 3.4 mmol/L — ABNORMAL LOW (ref 3.5–5.1)
Sodium: 135 mmol/L (ref 135–145)
TCO2: 18 mmol/L (ref 0–100)

## 2014-08-21 LAB — TROPONIN I

## 2014-08-21 MED ORDER — KETOROLAC TROMETHAMINE 30 MG/ML IJ SOLN
30.0000 mg | Freq: Once | INTRAMUSCULAR | Status: AC
  Administered 2014-08-21: 30 mg via INTRAVENOUS
  Filled 2014-08-21: qty 1

## 2014-08-21 MED ORDER — NAPROXEN 250 MG PO TABS: 250.0000 mg | ORAL_TABLET | Freq: Two times a day (BID) | ORAL | Status: DC

## 2014-08-21 MED ORDER — CYCLOBENZAPRINE HCL 5 MG PO TABS: 5.0000 mg | ORAL_TABLET | Freq: Three times a day (TID) | ORAL | Status: DC | PRN

## 2014-08-21 NOTE — ED Notes (Signed)
Pt left ED, ambulatory with no signs of distress. Pt verbalized discharge instructions.

## 2014-08-21 NOTE — ED Notes (Signed)
MD at bedside. 

## 2014-08-21 NOTE — Discharge Instructions (Signed)
Take the medications as prescribed. Try to eat and drink better. Follow through with your niece to get the help with your alcohol abuse. Look at the referrals to get help with your depression.   Emergency Department Resource Guide   Behavioral Health Resources in the Community: Intensive Outpatient Programs Organization         Address  Phone  Notes  [facility] [address] [place], [place] [phone]   [facility] [address] [place], [place]   ADS: [facility] [address] [place], [place]   [place] [address],  [place], [place] or [phone]   Substance Abuse Resources Organization         Address  Phone  Notes  [facility]  [phone]   [place]  [phone]   [facility] [place]   Chinita Pester  [phone]   Residential & Outpatient Substance Abuse Program  [phone]   Psychological Services Organization         Address  Phone  Notes  [facility] [place]  [place]  [phone]   [place] [address] [place] or [phone]    Mobile Crisis Teams Organization         Address  Phone  Notes  [facility], Mobile Crisis Care Unit  [phone]   [facility]  [address] [place], [place]   [place] [address], [place] [phone]    Self-Help/Support Groups Organization         Address  Phone             Notes  [place] [place] - variety of support groups  [place] Call for more information  Narcotics Anonymous (NA), [facility] [address], [employer]  2 meetings at this location   Special educational needs teacher         Address  Phone  Notes  [facility] [place],    [place]  [phone]   [facility]  [address] [place] [phone], [place], [place]   [place], [place] [phone] Admissions: 8am-3pm M-F  Incentives Substance [place] [address]    [place], [place] [phone]   [facility] [address] [place], [place], [place]   [facility] [address],  [place], [place]   [facility] - Intensive Outpatient [address], [place], [place]   [facility] [address]  [place], [place] [phone] or [phone]   [facility] ([facility]) [address], [place], [place] Accepts Medicaid  [facility] [place] [address]  [place] [phone] Substance Abuse/Addiction Treatment   [facility] Organization         Address  Phone  Notes  [facility]  [phone]   Domenic Schwab, PhD [address] Arlis Porta [place], [place]   [phone] or [phone]   [place], [place] [phone]   [place] [address], [place], [place] [phone] Insurance/Medicaid/sponsorship through [employer] and Families [address], [address]  [place], [place] [phone] [place], [place] [phone]    Dr. Adele Schilder  [phone]   [facility] Dept. 1) [address] [place] 2) [place] 3)  [address], [place] [phone]  [phone]   [place] [phone] or [phone] (After Hours)

## 2015-06-17 ENCOUNTER — Encounter (HOSPITAL_COMMUNITY): Payer: Self-pay | Admitting: Emergency Medicine

## 2015-06-17 ENCOUNTER — Emergency Department (HOSPITAL_COMMUNITY): Payer: Medicaid Other

## 2015-06-17 ENCOUNTER — Emergency Department (HOSPITAL_COMMUNITY)
Admission: EM | Admit: 2015-06-17 | Discharge: 2015-06-17 | Disposition: A | Discharge status: Home / Self Care | Payer: Medicaid Other | Attending: Emergency Medicine | Admitting: Emergency Medicine

## 2015-06-17 DIAGNOSIS — I252 Old myocardial infarction: Secondary | ICD-10-CM | POA: Diagnosis not present

## 2015-06-17 DIAGNOSIS — Z720 Tobacco use: Secondary | ICD-10-CM | POA: Diagnosis not present

## 2015-06-17 DIAGNOSIS — R42 Dizziness and giddiness: Secondary | ICD-10-CM | POA: Insufficient documentation

## 2015-06-17 DIAGNOSIS — R062 Wheezing: Secondary | ICD-10-CM | POA: Diagnosis not present

## 2015-06-17 DIAGNOSIS — R51 Headache: Secondary | ICD-10-CM | POA: Insufficient documentation

## 2015-06-17 DIAGNOSIS — I10 Essential (primary) hypertension: Secondary | ICD-10-CM | POA: Diagnosis not present

## 2015-06-17 DIAGNOSIS — R0789 Other chest pain: Secondary | ICD-10-CM | POA: Insufficient documentation

## 2015-06-17 DIAGNOSIS — R61 Generalized hyperhidrosis: Secondary | ICD-10-CM | POA: Insufficient documentation

## 2015-06-17 DIAGNOSIS — Z791 Long term (current) use of non-steroidal anti-inflammatories (NSAID): Secondary | ICD-10-CM | POA: Diagnosis not present

## 2015-06-17 DIAGNOSIS — R112 Nausea with vomiting, unspecified: Secondary | ICD-10-CM | POA: Insufficient documentation

## 2015-06-17 DIAGNOSIS — R519 Headache, unspecified: Secondary | ICD-10-CM

## 2015-06-17 DIAGNOSIS — R079 Chest pain, unspecified: Secondary | ICD-10-CM | POA: Diagnosis present

## 2015-06-17 LAB — CBC WITH DIFFERENTIAL/PLATELET
BASOS PCT: 1 %
Basophils Absolute: 0.1 10*3/uL (ref 0.0–0.1)
EOS PCT: 1 %
Eosinophils Absolute: 0.1 10*3/uL (ref 0.0–0.7)
HEMATOCRIT: 42.7 % (ref 39.0–52.0)
Hemoglobin: 15.7 g/dL (ref 13.0–17.0)
LYMPHS PCT: 43 %
Lymphs Abs: 3.3 10*3/uL (ref 0.7–4.0)
MCH: 33.7 pg (ref 26.0–34.0)
MCHC: 36.8 g/dL — AB (ref 30.0–36.0)
MCV: 91.6 fL (ref 78.0–100.0)
MONO ABS: 0.5 10*3/uL (ref 0.1–1.0)
MONOS PCT: 6 %
NEUTROS ABS: 3.8 10*3/uL (ref 1.7–7.7)
Neutrophils Relative %: 49 %
Platelets: 244 10*3/uL (ref 150–400)
RBC: 4.66 MIL/uL (ref 4.22–5.81)
RDW: 13.2 % (ref 11.5–15.5)
WBC: 7.7 10*3/uL (ref 4.0–10.5)

## 2015-06-17 LAB — COMPREHENSIVE METABOLIC PANEL
ALT: 19 U/L (ref 17–63)
ANION GAP: 13 (ref 5–15)
AST: 38 U/L (ref 15–41)
Albumin: 4.6 g/dL (ref 3.5–5.0)
Alkaline Phosphatase: 45 U/L (ref 38–126)
BILIRUBIN TOTAL: 0.6 mg/dL (ref 0.3–1.2)
BUN: 6 mg/dL (ref 6–20)
CO2: 21 mmol/L — ABNORMAL LOW (ref 22–32)
Calcium: 8.5 mg/dL — ABNORMAL LOW (ref 8.9–10.3)
Chloride: 97 mmol/L — ABNORMAL LOW (ref 101–111)
Creatinine, Ser: 0.76 mg/dL (ref 0.61–1.24)
GFR calc Af Amer: >60 mL/min (ref >60)
Glucose, Bld: 74 mg/dL (ref 65–99)
POTASSIUM: 4.1 mmol/L (ref 3.5–5.1)
Sodium: 131 mmol/L — ABNORMAL LOW (ref 135–145)
TOTAL PROTEIN: 7.5 g/dL (ref 6.5–8.1)

## 2015-06-17 LAB — I-STAT TROPONIN, ED: TROPONIN I, POC: 0 ng/mL (ref 0.00–0.08)

## 2015-06-17 LAB — TROPONIN I

## 2015-06-17 MED ORDER — MORPHINE SULFATE (PF) 4 MG/ML IV SOLN
4.0000 mg | Freq: Once | INTRAVENOUS | Status: AC
  Administered 2015-06-17: 4 mg via INTRAVENOUS
  Filled 2015-06-17: qty 1

## 2015-06-17 MED ORDER — DIPHENHYDRAMINE HCL 50 MG/ML IJ SOLN
25.0000 mg | Freq: Once | INTRAMUSCULAR | Status: AC
  Administered 2015-06-17: 25 mg via INTRAVENOUS
  Filled 2015-06-17: qty 1

## 2015-06-17 MED ORDER — KETOROLAC TROMETHAMINE 30 MG/ML IJ SOLN
30.0000 mg | Freq: Once | INTRAMUSCULAR | Status: AC
  Administered 2015-06-17: 30 mg via INTRAVENOUS
  Filled 2015-06-17: qty 1

## 2015-06-17 MED ORDER — METOCLOPRAMIDE HCL 5 MG/ML IJ SOLN
10.0000 mg | Freq: Once | INTRAMUSCULAR | Status: AC
  Administered 2015-06-17: 10 mg via INTRAVENOUS
  Filled 2015-06-17: qty 2

## 2015-06-17 NOTE — ED Notes (Signed)
Pt reports onset of chest pain this am. SOB,n/v and dizziness. Pt reports radiation to his L arm.

## 2015-06-17 NOTE — ED Provider Notes (Signed)
CSN: [id number]     Arrival date & time 06/17/15  1147 History  By signing my name below, I, Stephania Fragmin, attest that this documentation has been prepared under the direction and in the presence of Milton Ferguson, MD. Electronically Signed: Stephania Fragmin, ED Scribe. 06/17/2015. 12:43 PM.   Chief Complaint  Patient presents with  . Chest Pain   Patient is a 59 y.o. male presenting with chest pain. The history is provided by the patient (Patient complains of chest pain and headache today. He was recently admitted for 2 days for chest pain). No language interpreter was used.  Chest Pain Pain location:  Substernal area Pain quality: aching and pressure   Pain radiates to:  Does not radiate Pain radiates to the back: no   Pain severity:  Moderate Onset quality:  Sudden Relieved by:  Nothing Worsened by:  Nothing tried Ineffective treatments:  None tried Associated symptoms: diaphoresis, dizziness, nausea and vomiting   Associated symptoms: no abdominal pain, no back pain, no cough, no fatigue and no headache    HPI Comments: Douglas Stout is a 59 y.o. male with a history of HTN and acute MI, who presents to the Emergency Department complaining of moderate, pressure-like chest pain radiating to his left arm that began this morning while at work. Associated symptoms include diaphoresis, nausea, vomiting, and dizziness. Patient had just been discharged from [facility] 2 days ago after staying for 2 days for chest pain; he was told upon discharge that his heart seemed fine. He denies receiving a referral for follow-up but states he was given hypertension medication upon discharge. Patient does not have a PCP.    Past Medical History  Diagnosis Date  . Hypertension   . Acute MI [facility])    Past Surgical History  Procedure Laterality Date  . Bunionectomy     Family History  Problem Relation Age of Onset  . Cancer Mother    Social History  Substance Use Topics  . Smoking status: Current  Every Day Smoker -- 1.50 packs/day for 42 years    Types: Cigarettes  . Smokeless tobacco: Never Used  . Alcohol Use: 8.4 oz/week    14 Cans of beer per week    Review of Systems  Constitutional: Positive for diaphoresis. Negative for appetite change and fatigue.  HENT: Negative for congestion, ear discharge and sinus pressure.   Eyes: Negative for discharge.  Respiratory: Negative for cough.   Cardiovascular: Positive for chest pain.  Gastrointestinal: Positive for nausea and vomiting. Negative for abdominal pain and diarrhea.  Genitourinary: Negative for frequency and hematuria.  Musculoskeletal: Negative for back pain.  Skin: Negative for rash.  Neurological: Positive for dizziness. Negative for seizures and headaches.  Psychiatric/Behavioral: Negative for hallucinations.    Allergies  Review of patient's allergies indicates no known allergies.  Home Medications   Prior to Admission medications   Medication Sig Start Date End Date Taking? Authorizing Provider  Aspirin-Salicylamide-Caffeine (BC FAST PAIN RELIEF ARTHRITIS) [phone] MG PACK Take 1 packet by mouth daily as needed (for pain).    Historical Provider, MD  cyclobenzaprine (FLEXERIL) 5 MG tablet Take 1 tablet (5 mg total) by mouth 3 (three) times daily as needed. 08/21/14   Rolland Porter, MD  naproxen (NAPROSYN) 250 MG tablet Take 1 tablet (250 mg total) by mouth 2 (two) times daily. 08/21/14   Rolland Porter, MD   BP 125/80 mmHg  Pulse 103  Temp(Src) 98.1 F (36.7 C) (Oral)  Resp 19  Ht 5\' 8"  (1.727 m)  Wt 135 lb (61.236 kg)  BMI 20.53 kg/m2  SpO2 96% Physical Exam  Constitutional: He is oriented to person, place, and time. He appears well-developed.  HENT:  Head: Normocephalic.  Eyes: Conjunctivae and EOM are normal. No scleral icterus.  Neck: Neck supple. No thyromegaly present.  Cardiovascular: Normal rate and regular rhythm.  Exam reveals no gallop and no friction rub.   No murmur heard. Pulmonary/Chest: No  stridor. He has wheezes. He has no rales. He exhibits no tenderness.  Wheezing bilaterally.  Abdominal: He exhibits no distension. There is no tenderness. There is no rebound.  Musculoskeletal: Normal range of motion. He exhibits no edema.  Lymphadenopathy:    He has no cervical adenopathy.  Neurological: He is oriented to person, place, and time. He exhibits normal muscle tone. Coordination normal.  Skin: No rash noted. No erythema.  Psychiatric: He has a normal mood and affect. His behavior is normal.  Nursing note and vitals reviewed.   ED Course  Procedures (including critical care time)  DIAGNOSTIC STUDIES: Oxygen Saturation is 96% on RA, normal by my interpretation.    COORDINATION OF CARE: 12:11 PM - Pt made aware of EKG findings. Discussed treatment plan with pt at bedside. Pt verbalized understanding and agreed to plan.   Labs Review Labs Reviewed  CBC WITH DIFFERENTIAL/PLATELET  COMPREHENSIVE METABOLIC PANEL  TROPONIN I    Imaging Review No results found. I have personally reviewed and evaluated these images and lab results as part of my medical decision-making.   EKG Interpretation   Date/Time:  Monday June 17 2015 11:54:58 EDT Ventricular Rate:  99 PR Interval:  142 QRS Duration: 95 QT Interval:  346 QTC Calculation: 444 R Axis:   101 Text Interpretation:  Sinus rhythm Right axis deviation Confirmed by  Yaritzy Huser  MD, Akila Batta ([id number]) on 06/17/2015 12:06:53 PM      MDM   Final diagnoses:  None    Chest pain doubt coronary artery disease symptoms recently admitted and ruled out. Patient's symptoms improved prior to discharge. Troponin 2 was normal. Patient will be instructed to take baby aspirin a day and to follow-up with cardiology  The chart was scribed for me under my direct supervision.  I personally performed the history, physical, and medical decision making and all procedures in the evaluation of this patient.Milton Ferguson, MD 06/17/15  [phone]

## 2015-06-17 NOTE — Discharge Instructions (Signed)
Take one baby aspirin a day and follow up with [place] cardiology in 1-2 weeks. Stop smoking

## 2015-08-02 ENCOUNTER — Emergency Department (HOSPITAL_COMMUNITY)
Admission: EM | Admit: 2015-08-02 | Discharge: 2015-08-02 | Disposition: A | Discharge status: Home / Self Care | Payer: Medicaid Other | Attending: Emergency Medicine | Admitting: Emergency Medicine

## 2015-08-02 ENCOUNTER — Encounter (HOSPITAL_COMMUNITY): Payer: Self-pay | Admitting: *Deleted

## 2015-08-02 ENCOUNTER — Emergency Department (HOSPITAL_COMMUNITY): Payer: Medicaid Other

## 2015-08-02 DIAGNOSIS — F17210 Nicotine dependence, cigarettes, uncomplicated: Secondary | ICD-10-CM | POA: Diagnosis not present

## 2015-08-02 DIAGNOSIS — E871 Hypo-osmolality and hyponatremia: Secondary | ICD-10-CM | POA: Diagnosis not present

## 2015-08-02 DIAGNOSIS — I252 Old myocardial infarction: Secondary | ICD-10-CM | POA: Diagnosis not present

## 2015-08-02 DIAGNOSIS — R079 Chest pain, unspecified: Secondary | ICD-10-CM | POA: Diagnosis not present

## 2015-08-02 DIAGNOSIS — I10 Essential (primary) hypertension: Secondary | ICD-10-CM | POA: Diagnosis not present

## 2015-08-02 DIAGNOSIS — F1010 Alcohol abuse, uncomplicated: Secondary | ICD-10-CM | POA: Insufficient documentation

## 2015-08-02 LAB — BASIC METABOLIC PANEL
ANION GAP: 13 (ref 5–15)
BUN: <5 mg/dL — ABNORMAL LOW (ref 6–20)
CALCIUM: 8.6 mg/dL — AB (ref 8.9–10.3)
CO2: 22 mmol/L (ref 22–32)
Chloride: 92 mmol/L — ABNORMAL LOW (ref 101–111)
Creatinine, Ser: 0.65 mg/dL (ref 0.61–1.24)
Glucose, Bld: 109 mg/dL — ABNORMAL HIGH (ref 65–99)
Potassium: 3.6 mmol/L (ref 3.5–5.1)
Sodium: 127 mmol/L — ABNORMAL LOW (ref 135–145)

## 2015-08-02 LAB — CBC
HCT: 42.2 % (ref 39.0–52.0)
HEMOGLOBIN: 15.3 g/dL (ref 13.0–17.0)
MCH: 33.1 pg (ref 26.0–34.0)
MCHC: 36.3 g/dL — ABNORMAL HIGH (ref 30.0–36.0)
MCV: 91.3 fL (ref 78.0–100.0)
Platelets: 209 10*3/uL (ref 150–400)
RBC: 4.62 MIL/uL (ref 4.22–5.81)
RDW: 13 % (ref 11.5–15.5)
WBC: 6.9 10*3/uL (ref 4.0–10.5)

## 2015-08-02 LAB — TROPONIN I

## 2015-08-02 MED ORDER — MORPHINE SULFATE (PF) 4 MG/ML IV SOLN
6.0000 mg | Freq: Once | INTRAVENOUS | Status: AC
  Administered 2015-08-02: 6 mg via INTRAMUSCULAR
  Filled 2015-08-02: qty 2

## 2015-08-02 NOTE — ED Notes (Signed)
Pt refuses discharge vitals

## 2015-08-02 NOTE — ED Notes (Signed)
Pt refuses to be on the cardiac monitor.

## 2015-08-02 NOTE — ED Notes (Signed)
Pt with left CP since this morning with sob, diaphoresis, and N/V, pt very unsteady with getting up from wheelchair to bed

## 2015-08-02 NOTE — ED Notes (Signed)
Pt up at door, removing hisself from the monitor.  Pulled IV out.  Pt states he is hurting and he is leaving.  Encouraged pt to wait and see doctor.

## 2015-08-02 NOTE — Discharge Instructions (Signed)

## 2015-08-09 NOTE — ED Provider Notes (Signed)
CSN: [id number]     Arrival date & time 08/02/15  1308 History   First MD Initiated Contact with Patient 08/02/15 1320     Chief Complaint  Patient presents with  . Chest Pain     (Consider location/radiation/quality/duration/timing/severity/associated sxs/prior Treatment) HPI   [age]-year-old male with chest pain. Onset this morning when he woke up. Pain is less out of his chest. Sharp in nature. Worse with certain movements. Mild shortness of breath. No cough. No fevers or chills. No unusual leg pain or swelling. No change with exertion. Admits to drinking earlier today.   Past Medical History  Diagnosis Date  . Hypertension   . Acute MI [facility])    Past Surgical History  Procedure Laterality Date  . Bunionectomy     Family History  Problem Relation Age of Onset  . Cancer Mother    Social History  Substance Use Topics  . Smoking status: Current Every Day Smoker -- 1.50 packs/day for 42 years    Types: Cigarettes  . Smokeless tobacco: Never Used  . Alcohol Use: 8.4 oz/week    14 Cans of beer per week     Comment: admits to drinking today    Review of Systems  All systems reviewed and negative, other than as noted in HPI.   Allergies  Review of patient's allergies indicates no known allergies.  Home Medications   Prior to Admission medications   Not on File   BP 165/97 mmHg  Pulse 84  Resp 17  Ht 5\' 8"  (1.727 m)  Wt 120 lb (54.432 kg)  BMI 18.25 kg/m2  SpO2 100% Physical Exam  Constitutional: He appears well-developed and well-nourished. No distress.  HENT:  Head: Normocephalic and atraumatic.  Eyes: Conjunctivae are normal. Right eye exhibits no discharge. Left eye exhibits no discharge.  Neck: Neck supple.  Cardiovascular: Normal rate, regular rhythm and normal heart sounds.  Exam reveals no gallop and no friction rub.   No murmur heard. Pulmonary/Chest: Effort normal and breath sounds normal. No respiratory distress.  Abdominal: Soft. He exhibits no  distension. There is no tenderness.  Musculoskeletal: He exhibits no edema or tenderness.  Neurological: He is alert.  Lower extremities symmetric as compared to each other. No calf tenderness. Negative Homan's. No palpable cords.   Skin: Skin is warm and dry.  Psychiatric:  intoxicated  Nursing note and vitals reviewed.   ED Course  Procedures (including critical care time) Labs Review Labs Reviewed  BASIC METABOLIC PANEL - Abnormal; Notable for the following:    Sodium 127 (*)    Chloride 92 (*)    Glucose, Bld 109 (*)    BUN <5 (*)    Calcium 8.6 (*)    All other components within normal limits  CBC - Abnormal; Notable for the following:    MCHC 36.3 (*)    All other components within normal limits  TROPONIN I    Imaging Review No results found. I have personally reviewed and evaluated these images and lab results as part of my medical decision-making.   EKG Interpretation   Date/Time:  Friday August 02 2015 13:20:12 EST Ventricular Rate:  96 PR Interval:  173 QRS Duration: 101 QT Interval:  361 QTC Calculation: 456 R Axis:   94 Text Interpretation:  Sinus rhythm Right axis deviation Baseline wander in  lead(s) V4 V5 V6 Confirmed by Wilson Singer  MD, [place] ([id number]) on 08/02/2015  2:46:30 PM      MDM   Final diagnoses:  Chest pain, unspecified chest pain type  Hyponatremia  Alcohol abuse    59yM with CP. Atypical for ACS. Doubt PE, dissection, serious infection or other emergent process. It has been determined that no acute conditions requiring further emergency intervention are present at this time. The patient has been advised of the diagnosis and plan. I reviewed any labs and imaging including any potential incidental findings. We have discussed signs and symptoms that warrant return to the ED and they are listed in the discharge instructions.      Virgel Manifold, MD 08/09/15 [phone]

## 2015-12-10 ENCOUNTER — Emergency Department (HOSPITAL_COMMUNITY)
Admission: EM | Admit: 2015-12-10 | Discharge: 2015-12-10 | Disposition: A | Discharge status: Home / Self Care | Payer: Medicaid Other | Attending: Emergency Medicine | Admitting: Emergency Medicine

## 2015-12-10 ENCOUNTER — Emergency Department (HOSPITAL_COMMUNITY): Payer: Medicaid Other

## 2015-12-10 ENCOUNTER — Encounter (HOSPITAL_COMMUNITY): Payer: Self-pay | Admitting: Emergency Medicine

## 2015-12-10 DIAGNOSIS — R0789 Other chest pain: Secondary | ICD-10-CM | POA: Diagnosis not present

## 2015-12-10 DIAGNOSIS — I252 Old myocardial infarction: Secondary | ICD-10-CM | POA: Insufficient documentation

## 2015-12-10 DIAGNOSIS — F17210 Nicotine dependence, cigarettes, uncomplicated: Secondary | ICD-10-CM | POA: Insufficient documentation

## 2015-12-10 DIAGNOSIS — I10 Essential (primary) hypertension: Secondary | ICD-10-CM | POA: Diagnosis not present

## 2015-12-10 DIAGNOSIS — M791 Myalgia: Secondary | ICD-10-CM | POA: Diagnosis not present

## 2015-12-10 DIAGNOSIS — F10120 Alcohol abuse with intoxication, uncomplicated: Secondary | ICD-10-CM | POA: Diagnosis not present

## 2015-12-10 DIAGNOSIS — M79602 Pain in left arm: Secondary | ICD-10-CM | POA: Insufficient documentation

## 2015-12-10 DIAGNOSIS — Z791 Long term (current) use of non-steroidal anti-inflammatories (NSAID): Secondary | ICD-10-CM | POA: Insufficient documentation

## 2015-12-10 DIAGNOSIS — F10920 Alcohol use, unspecified with intoxication, uncomplicated: Secondary | ICD-10-CM

## 2015-12-10 DIAGNOSIS — R079 Chest pain, unspecified: Secondary | ICD-10-CM | POA: Diagnosis present

## 2015-12-10 LAB — CBC
HCT: 37.2 % — ABNORMAL LOW (ref 39.0–52.0)
Hemoglobin: 13.4 g/dL (ref 13.0–17.0)
MCH: 32.9 pg (ref 26.0–34.0)
MCHC: 36 g/dL (ref 30.0–36.0)
MCV: 91.4 fL (ref 78.0–100.0)
PLATELETS: 86 10*3/uL — AB (ref 150–400)
RBC: 4.07 MIL/uL — AB (ref 4.22–5.81)
RDW: 12.8 % (ref 11.5–15.5)
WBC: 7.8 10*3/uL (ref 4.0–10.5)

## 2015-12-10 LAB — BASIC METABOLIC PANEL
Anion gap: 14 (ref 5–15)
BUN: <5 mg/dL — ABNORMAL LOW (ref 6–20)
CALCIUM: 8.4 mg/dL — AB (ref 8.9–10.3)
CO2: 22 mmol/L (ref 22–32)
CREATININE: 0.71 mg/dL (ref 0.61–1.24)
Chloride: 94 mmol/L — ABNORMAL LOW (ref 101–111)
GLUCOSE: 110 mg/dL — AB (ref 65–99)
Potassium: 3.5 mmol/L (ref 3.5–5.1)
Sodium: 130 mmol/L — ABNORMAL LOW (ref 135–145)

## 2015-12-10 LAB — I-STAT TROPONIN, ED
TROPONIN I, POC: 0 ng/mL (ref 0.00–0.08)
Troponin i, poc: 0 ng/mL (ref 0.00–0.08)

## 2015-12-10 LAB — ETHANOL: Alcohol, Ethyl (B): 324 mg/dL (ref <5)

## 2015-12-10 MED ORDER — KETOROLAC TROMETHAMINE 30 MG/ML IJ SOLN
30.0000 mg | Freq: Once | INTRAMUSCULAR | Status: AC
  Administered 2015-12-10: 30 mg via INTRAVENOUS
  Filled 2015-12-10: qty 1

## 2015-12-10 MED ORDER — METHOCARBAMOL 500 MG PO TABS
1000.0000 mg | ORAL_TABLET | Freq: Once | ORAL | Status: AC
  Administered 2015-12-10: 1000 mg via ORAL
  Filled 2015-12-10: qty 2

## 2015-12-10 NOTE — ED Notes (Signed)
Dr Yelverton at bedside.  

## 2015-12-10 NOTE — ED Notes (Signed)
C/o sharp pain to mid chest and left arm.  Rates pain 9/10.  Drank one beer about 1 hour ago.  Pt says he drank 1/2 of a 40 ounce of beer before coming to ED.

## 2015-12-10 NOTE — ED Provider Notes (Signed)
CSN: [id number]     Arrival date & time 12/10/15  1541 History   First MD Initiated Contact with Patient 12/10/15 1559     Chief Complaint  Patient presents with  . Chest Pain     (Consider location/radiation/quality/duration/timing/severity/associated sxs/prior Treatment) HPI Patient presents with left-sided chest pain starting about 2-3 hours prior to arrival. Patient states he is thinking about his family when he started having the chest pain. Pain is described as sharp and worse with movement and palpation. No shortness of breath. No new lower extremity swelling or asymmetry. Patient's been seen in emergency department multiple times for chest pain. States has not followed up with a cardiologist. Asking for pain medication for his pain. Admits to drinking beer prior to arrival. Past Medical History  Diagnosis Date  . Hypertension   . Acute MI [facility])    Past Surgical History  Procedure Laterality Date  . Bunionectomy     Family History  Problem Relation Age of Onset  . Cancer Mother    Social History  Substance Use Topics  . Smoking status: Current Every Day Smoker -- 1.50 packs/day for 42 years    Types: Cigarettes  . Smokeless tobacco: Never Used  . Alcohol Use: 8.4 oz/week    14 Cans of beer per week     Comment: admits to drinking today    Review of Systems  Respiratory: Negative for cough, chest tightness and shortness of breath.   Cardiovascular: Positive for chest pain. Negative for palpitations and leg swelling.  Gastrointestinal: Negative for nausea, vomiting and abdominal pain.  Musculoskeletal: Positive for myalgias. Negative for back pain, neck pain and neck stiffness.  Skin: Negative for rash and wound.  Neurological: Negative for dizziness, weakness, light-headedness, numbness and headaches.  Psychiatric/Behavioral: Negative for suicidal ideas. The patient is nervous/anxious.   All other systems reviewed and are negative.     Allergies  Review of  patient's allergies indicates no known allergies.  Home Medications   Prior to Admission medications   Medication Sig Start Date End Date Taking? Authorizing Provider  ibuprofen (ADVIL,MOTRIN) 200 MG tablet Take 800 mg by mouth every 6 (six) hours as needed for mild pain or moderate pain.   Yes Historical Provider, MD   BP 141/94 mmHg  Pulse 86  Temp(Src) 97.7 F (36.5 C)  Resp 17  Ht 5\' 8"  (1.727 m)  Wt 120 lb (54.432 kg)  BMI 18.25 kg/m2  SpO2 98% Physical Exam  Constitutional: He is oriented to person, place, and time. He appears well-developed and well-nourished. No distress.  Emotionally labile. Slurred speech.  HENT:  Head: Normocephalic and atraumatic.  Mouth/Throat: Oropharynx is clear and moist. No oropharyngeal exudate.  Eyes: EOM are normal. Pupils are equal, round, and reactive to light.  Neck: Normal range of motion. Neck supple. No JVD present.  Cardiovascular: Normal rate and regular rhythm.   Pulmonary/Chest: Effort normal and breath sounds normal. No respiratory distress. He has no wheezes. He has no rales. He exhibits tenderness (chest tenderness completely reproduced with palpation over the left anterior chest. There is no crepitance or deformity.).  Abdominal: Soft. Bowel sounds are normal. He exhibits no distension and no mass. There is no tenderness. There is no rebound and no guarding.  Musculoskeletal: Normal range of motion. He exhibits no edema or tenderness.  No lower extremity asymmetry or tenderness. Distal pulses equal and intact.  Neurological: He is alert and oriented to person, place, and time.  Moves all extremities without deficit.  Sensation is fully intact.  Skin: Skin is warm and dry. No rash noted. No erythema.  Psychiatric:  Emotionally labile, crying at times. Denies SI.  Nursing note and vitals reviewed.   ED Course  Procedures (including critical care time) Labs Review Labs Reviewed  BASIC METABOLIC PANEL - Abnormal; Notable for the  following:    Sodium 130 (*)    Chloride 94 (*)    Glucose, Bld 110 (*)    BUN <5 (*)    Calcium 8.4 (*)    All other components within normal limits  CBC - Abnormal; Notable for the following:    RBC 4.07 (*)    HCT 37.2 (*)    Platelets 86 (*)    All other components within normal limits  ETHANOL - Abnormal; Notable for the following:    Alcohol, Ethyl (B) 324 (*)    All other components within normal limits  I-STAT TROPOININ, ED  Randolm Idol, ED    Imaging Review Dg Chest 2 View  12/10/2015  CLINICAL DATA:  Left-sided chest pain and shortness of Breath EXAM: CHEST  2 VIEW COMPARISON:  08/02/2015 FINDINGS: The heart size and mediastinal contours are within normal limits. Both lungs are clear. The visualized skeletal structures are unremarkable. IMPRESSION: No active cardiopulmonary disease. Electronically Signed   By: Inez Catalina M.D.   On: 12/10/2015 16:55   I have personally reviewed and evaluated these images and lab results as part of my medical decision-making.   EKG Interpretation None      MDM   Final diagnoses:  Left-sided chest wall pain  Alcohol intoxication, uncomplicated ([place])   Patient is anxious to leave. Has a sober relative at bedside. Chest pain is very atypical for coronary artery disease. Is reproduced with palpation. Patient been seen in the emergency department and evaluated multiple times for similar pain. He has an EKG without any ischemic findings. He has troponin 2 which are normal. I believe the patient can safely be discharged. She's been advised to follow-up with cardiology given risk factors for coronary artery disease. Return precautions have been given.     Julianne Rice, MD 12/10/15 [phone]

## 2015-12-10 NOTE — Discharge Instructions (Signed)
Chest Wall Pain Chest wall pain is pain in or around the bones and muscles of your chest. Sometimes, an injury causes this pain. Sometimes, the cause may not be known. This pain may take several weeks or longer to get better. HOME CARE INSTRUCTIONS  Pay attention to any changes in your symptoms. Take these actions to help with your pain:   Rest as told by your health care provider.   Avoid activities that cause pain. These include any activities that use your chest muscles or your abdominal and side muscles to lift heavy items.   If directed, apply ice to the painful area:  Put ice in a plastic bag.  Place a towel between your skin and the bag.  Leave the ice on for 20 minutes, 2-3 times per day.  Take over-the-counter and prescription medicines only as told by your health care provider.  Do not use tobacco products, including cigarettes, chewing tobacco, and e-cigarettes. If you need help quitting, ask your health care provider.  Keep all follow-up visits as told by your health care provider. This is important. SEEK MEDICAL CARE IF:  You have a fever.  Your chest pain becomes worse.  You have new symptoms. SEEK IMMEDIATE MEDICAL CARE IF:  You have nausea or vomiting.  You feel sweaty or light-headed.  You have a cough with phlegm (sputum) or you cough up blood.  You develop shortness of breath.   This information is not intended to replace advice given to you by your health care provider. Make sure you discuss any questions you have with your health care provider.   Document Released: 08/17/2005 Document Revised: 05/08/2015 Document Reviewed: 11/12/2014 Elsevier Interactive Patient Education 2016 [employer].  Alcohol Use Disorder Alcohol use disorder is a mental disorder. It is not a one-time incident of heavy drinking. Alcohol use disorder is the excessive and uncontrollable use of alcohol over time that leads to problems with functioning in one or more areas of  daily living. People with this disorder risk harming themselves and others when they drink to excess. Alcohol use disorder also can cause other mental disorders, such as mood and anxiety disorders, and serious physical problems. People with alcohol use disorder often misuse other drugs.  Alcohol use disorder is common and widespread. Some people with this disorder drink alcohol to cope with or escape from negative life events. Others drink to relieve chronic pain or symptoms of mental illness. People with a family history of alcohol use disorder are at higher risk of losing control and using alcohol to excess.  Drinking too much alcohol can cause injury, accidents, and health problems. One drink can be too much when you are:  Working.  Pregnant or breastfeeding.  Taking medicines. Ask your doctor.  Driving or planning to drive. SYMPTOMS  Signs and symptoms of alcohol use disorder may include the following:   Consumption ofalcohol inlarger amounts or over a longer period of time than intended.  Multiple unsuccessful attempts to cutdown or control alcohol use.   A great deal of time spent obtaining alcohol, using alcohol, or recovering from the effects of alcohol (hangover).  A strong desire or urge to use alcohol (cravings).   Continued use of alcohol despite problems at work, school, or home because of alcohol use.   Continued use of alcohol despite problems in relationships because of alcohol use.  Continued use of alcohol in situations when it is physically hazardous, such as driving a car.  Continued use of alcohol despite  awareness of a physical or psychological problem that is likely related to alcohol use. Physical problems related to alcohol use can involve the brain, heart, liver, stomach, and intestines. Psychological problems related to alcohol use include intoxication, depression, anxiety, psychosis, delirium, and dementia.   The need for increased amounts of alcohol  to achieve the same desired effect, or a decreased effect from the consumption of the same amount of alcohol (tolerance).  Withdrawal symptoms upon reducing or stopping alcohol use, or alcohol use to reduce or avoid withdrawal symptoms. Withdrawal symptoms include:  Racing heart.  Hand tremor.  Difficulty sleeping.  Nausea.  Vomiting.  Hallucinations.  Restlessness.  Seizures. DIAGNOSIS Alcohol use disorder is diagnosed through an assessment by your health care provider. Your health care provider may start by asking three or four questions to screen for excessive or problematic alcohol use. To confirm a diagnosis of alcohol use disorder, at least two symptoms must be present within a 52-month period. The severity of alcohol use disorder depends on the number of symptoms:  Mild--two or three.  Moderate--four or five.  Severe--six or more. Your health care provider may perform a physical exam or use results from lab tests to see if you have physical problems resulting from alcohol use. Your health care provider may refer you to a mental health professional for evaluation. TREATMENT  Some people with alcohol use disorder are able to reduce their alcohol use to low-risk levels. Some people with alcohol use disorder need to quit drinking alcohol. When necessary, mental health professionals with specialized training in substance use treatment can help. Your health care provider can help you decide how severe your alcohol use disorder is and what type of treatment you need. The following forms of treatment are available:   Detoxification. Detoxification involves the use of prescription medicines to prevent alcohol withdrawal symptoms in the first week after quitting. This is important for people with a history of symptoms of withdrawal and for heavy drinkers who are likely to have withdrawal symptoms. Alcohol withdrawal can be dangerous and, in severe cases, cause death. Detoxification is  usually provided in a hospital or in-patient substance use treatment facility.  Counseling or talk therapy. Talk therapy is provided by substance use treatment counselors. It addresses the reasons people use alcohol and ways to keep them from drinking again. The goals of talk therapy are to help people with alcohol use disorder find healthy activities and ways to cope with life stress, to identify and avoid triggers for alcohol use, and to handle cravings, which can cause relapse.  Medicines.Different medicines can help treat alcohol use disorder through the following actions:  Decrease alcohol cravings.  Decrease the positive reward response felt from alcohol use.  Produce an uncomfortable physical reaction when alcohol is used (aversion therapy).  Support groups. Support groups are run by people who have quit drinking. They provide emotional support, advice, and guidance. These forms of treatment are often combined. Some people with alcohol use disorder benefit from intensive combination treatment provided by specialized substance use treatment centers. Both inpatient and outpatient treatment programs are available.   This information is not intended to replace advice given to you by your health care provider. Make sure you discuss any questions you have with your health care provider.   Document Released: 09/24/2004 Document Revised: 09/07/2014 Document Reviewed: 11/24/2012 Elsevier Interactive Patient Education [employer].

## 2015-12-25 ENCOUNTER — Ambulatory Visit (HOSPITAL_COMMUNITY)
Admission: RE | Admit: 2015-12-25 | Discharge: 2015-12-25 | Disposition: A | Discharge status: Home / Self Care | Payer: Medicaid Other | Source: Ambulatory Visit | Attending: Family Medicine | Admitting: Family Medicine

## 2015-12-25 ENCOUNTER — Other Ambulatory Visit (HOSPITAL_COMMUNITY): Payer: Self-pay | Admitting: Family Medicine

## 2015-12-25 DIAGNOSIS — M5136 Other intervertebral disc degeneration, lumbar region: Secondary | ICD-10-CM

## 2015-12-25 DIAGNOSIS — I700 Atherosclerosis of aorta: Secondary | ICD-10-CM | POA: Diagnosis not present

## 2015-12-25 DIAGNOSIS — M5030 Other cervical disc degeneration, unspecified cervical region: Secondary | ICD-10-CM | POA: Insufficient documentation

## 2015-12-25 DIAGNOSIS — M47892 Other spondylosis, cervical region: Secondary | ICD-10-CM | POA: Diagnosis not present

## 2016-03-09 ENCOUNTER — Other Ambulatory Visit (HOSPITAL_COMMUNITY): Payer: Self-pay | Admitting: Family Medicine

## 2016-03-09 DIAGNOSIS — M545 Low back pain: Secondary | ICD-10-CM

## 2016-03-11 ENCOUNTER — Ambulatory Visit (HOSPITAL_COMMUNITY)
Admission: RE | Admit: 2016-03-11 | Discharge: 2016-03-11 | Disposition: A | Discharge status: Home / Self Care | Payer: Medicaid Other | Source: Ambulatory Visit | Attending: Family Medicine | Admitting: Family Medicine

## 2016-03-11 DIAGNOSIS — M4806 Spinal stenosis, lumbar region: Secondary | ICD-10-CM | POA: Diagnosis not present

## 2016-03-11 DIAGNOSIS — M545 Low back pain: Secondary | ICD-10-CM

## 2016-03-11 DIAGNOSIS — M4854XA Collapsed vertebra, not elsewhere classified, thoracic region, initial encounter for fracture: Secondary | ICD-10-CM | POA: Insufficient documentation

## 2016-09-08 ENCOUNTER — Encounter (INDEPENDENT_AMBULATORY_CARE_PROVIDER_SITE_OTHER): Payer: Self-pay | Admitting: *Deleted

## 2016-10-07 ENCOUNTER — Encounter (INDEPENDENT_AMBULATORY_CARE_PROVIDER_SITE_OTHER): Payer: Self-pay | Admitting: *Deleted

## 2016-10-07 ENCOUNTER — Telehealth (INDEPENDENT_AMBULATORY_CARE_PROVIDER_SITE_OTHER): Payer: Self-pay | Admitting: *Deleted

## 2016-10-07 ENCOUNTER — Other Ambulatory Visit (INDEPENDENT_AMBULATORY_CARE_PROVIDER_SITE_OTHER): Payer: Self-pay | Admitting: *Deleted

## 2016-10-07 DIAGNOSIS — Z1211 Encounter for screening for malignant neoplasm of colon: Secondary | ICD-10-CM | POA: Insufficient documentation

## 2016-10-07 MED ORDER — PEG 3350-KCL-NA BICARB-NACL 420 G PO SOLR: 4000.0000 mL | Freq: Once | ORAL | 0 refills | Status: AC

## 2016-10-07 NOTE — Telephone Encounter (Signed)
Referring MD/PCP: dondiego   Procedure: tcs w propofol  Reason/Indication:  screening  Has patient had this procedure before?  no  If so, when, by whom and where?    Is there a family history of colon cancer?  no  Who?  What age when diagnosed?    Is patient diabetic?   no      Does patient have prosthetic heart valve or mechanical valve?  no  Do you have a pacemaker?  no  Has patient ever had endocarditis? no  Has patient had joint replacement within last 12 months?  no  Does patient tend to be constipated or take laxatives? yes  Does patient have a history of alcohol/drug use?  Yes, drink 2 40 oz a day  Is patient on Coumadin, Plavix and/or Aspirin? no  Medications: oxycodone 10 mg tid  Allergies: nkda  Medication Adjustment:   Procedure date & time: 10/30/16 at 730 preop 10/27/16 at 10

## 2016-10-07 NOTE — Telephone Encounter (Signed)
agree

## 2016-10-07 NOTE — Telephone Encounter (Signed)
Patient needs trilyte 

## 2016-10-26 NOTE — Patient Instructions (Signed)
Douglas Stout  10/26/2016     @PREFPERIOPPHARMACY @   Your procedure is scheduled on  10/30/2016   Report to [facility] at  615  A.M.  Call this number if you have problems the morning of surgery:  [phone]   Remember:  Do not eat food or drink liquids after midnight.  Take these medicines the morning of surgery with A SIP OF WATER  Oxycodone.   Do not wear jewelry, make-up or nail polish.  Do not wear lotions, powders, or perfumes, or deoderant.  Do not shave 48 hours prior to surgery.  Men may shave face and neck.  Do not bring valuables to the hospital.  [facility] is not responsible for any belongings or valuables.  Contacts, dentures or bridgework may not be worn into surgery.  Leave your suitcase in the car.  After surgery it may be brought to your room.  For patients admitted to the hospital, discharge time will be determined by your treatment team.  Patients discharged the day of surgery will not be allowed to drive home.   Name and phone number of your driver:   family Special instructions:  Follow the diet and prep instructions given to you by Dr Olevia Perches office.  Please read over the following fact sheets that you were given. Anesthesia Post-op Instructions and Care and Recovery After Surgery       Colonoscopy, Adult A colonoscopy is an exam to look at the entire large intestine. During the exam, a lubricated, bendable tube is inserted into the anus and then passed into the rectum, colon, and other parts of the large intestine. A colonoscopy is often done as a part of normal colorectal screening or in response to certain symptoms, such as anemia, persistent diarrhea, abdominal pain, and blood in the stool. The exam can help screen for and diagnose medical problems, including:  Tumors.  Polyps.  Inflammation.  Areas of bleeding. Tell a health care provider about:  Any allergies you have.  All medicines you are taking, including vitamins,  herbs, eye drops, creams, and over-the-counter medicines.  Any problems you or family members have had with anesthetic medicines.  Any blood disorders you have.  Any surgeries you have had.  Any medical conditions you have.  Any problems you have had passing stool. What are the risks? Generally, this is a safe procedure. However, problems may occur, including:  Bleeding.  A tear in the intestine.  A reaction to medicines given during the exam.  Infection (rare). What happens before the procedure? Eating and drinking restrictions  Follow instructions from your health care provider about eating and drinking, which may include:  A few days before the procedure - follow a low-fiber diet. Avoid nuts, seeds, dried fruit, raw fruits, and vegetables.  1-3 days before the procedure - follow a clear liquid diet. Drink only clear liquids, such as clear broth or bouillon, black coffee or tea, clear juice, clear soft drinks or sports drinks, gelatin desert, and popsicles. Avoid any liquids that contain red or purple dye.  On the day of the procedure - do not eat or drink anything during the 2 hours before the procedure, or within the time period that your health care provider recommends. Bowel prep  If you were prescribed an oral bowel prep to clean out your colon:  Take it as told by your health care provider. Starting the day before your procedure, you will need to  drink a large amount of medicated liquid. The liquid will cause you to have multiple loose stools until your stool is almost clear or light green.  If your skin or anus gets irritated from diarrhea, you may use these to relieve the irritation:  Medicated wipes, such as adult wet wipes with aloe and vitamin E.  A skin soothing-product like petroleum jelly.  If you vomit while drinking the bowel prep, take a break for up to 60 minutes and then begin the bowel prep again. If vomiting continues and you cannot take the bowel prep  without vomiting, call your health care provider. General instructions  Ask your health care provider about changing or stopping your regular medicines. This is especially important if you are taking diabetes medicines or blood thinners.  Plan to have someone take you home from the hospital or clinic. What happens during the procedure?  An IV tube may be inserted into one of your veins.  You will be given medicine to help you relax (sedative).  To reduce your risk of infection:  Your health care team will wash or sanitize their hands.  Your anal area will be washed with soap.  You will be asked to lie on your side with your knees bent.  Your health care provider will lubricate a long, thin, flexible tube. The tube will have a camera and a light on the end.  The tube will be inserted into your anus.  The tube will be gently eased through your rectum and colon.  Air will be delivered into your colon to keep it open. You may feel some pressure or cramping.  The camera will be used to take images during the procedure.  A small tissue sample may be removed from your body to be examined under a microscope (biopsy). If any potential problems are found, the tissue will be sent to a lab for testing.  If small polyps are found, your health care provider may remove them and have them checked for cancer cells.  The tube that was inserted into your anus will be slowly removed. The procedure may vary among health care providers and hospitals. What happens after the procedure?  Your blood pressure, heart rate, breathing rate, and blood oxygen level will be monitored until the medicines you were given have worn off.  Do not drive for 24 hours after the exam.  You may have a small amount of blood in your stool.  You may pass gas and have mild abdominal cramping or bloating due to the air that was used to inflate your colon during the exam.  It is up to you to get the results of your  procedure. Ask your health care provider, or the department performing the procedure, when your results will be ready. This information is not intended to replace advice given to you by your health care provider. Make sure you discuss any questions you have with your health care provider. Document Released: 08/14/2000 Document Revised: 03/06/2016 Document Reviewed: 10/29/2015 Elsevier Interactive Patient Education  2017 [employer]  Colonoscopy, Adult, Care After This sheet gives you information about how to care for yourself after your procedure. Your health care provider may also give you more specific instructions. If you have problems or questions, contact your health care provider. What can I expect after the procedure? After the procedure, it is common to have:  A small amount of blood in your stool for 24 hours after the procedure.  Some gas.  Mild abdominal  cramping or bloating. Follow these instructions at home: General instructions  For the first 24 hours after the procedure:  Do not drive or use machinery.  Do not sign important documents.  Do not drink alcohol.  Do your regular daily activities at a slower pace than normal.  Eat soft, easy-to-digest foods.  Rest often.  Take over-the-counter or prescription medicines only as told by your health care provider.  It is up to you to get the results of your procedure. Ask your health care provider, or the department performing the procedure, when your results will be ready. Relieving cramping and bloating  Try walking around when you have cramps or feel bloated.  Apply heat to your abdomen as told by your health care provider. Use a heat source that your health care provider recommends, such as a moist heat pack or a heating pad.  Place a towel between your skin and the heat source.  Leave the heat on for 20-30 minutes.  Remove the heat if your skin turns bright red. This is especially important if you are  unable to feel pain, heat, or cold. You may have a greater risk of getting burned. Eating and drinking  Drink enough fluid to keep your urine clear or pale yellow.  Resume your normal diet as instructed by your health care provider. Avoid heavy or fried foods that are hard to digest.  Avoid drinking alcohol for as long as instructed by your health care provider. Contact a health care provider if:  You have blood in your stool 2-3 days after the procedure. Get help right away if:  You have more than a small spotting of blood in your stool.  You pass large blood clots in your stool.  Your abdomen is swollen.  You have nausea or vomiting.  You have a fever.  You have increasing abdominal pain that is not relieved with medicine. This information is not intended to replace advice given to you by your health care provider. Make sure you discuss any questions you have with your health care provider. Document Released: 03/31/2004 Document Revised: 05/11/2016 Document Reviewed: 10/29/2015 Elsevier Interactive Patient Education  2017 [place] Anesthesia is a term that refers to techniques, procedures, and medicines that help a person stay safe and comfortable during a medical procedure. Monitored anesthesia care, or sedation, is one type of anesthesia. Your anesthesia specialist may recommend sedation if you will be having a procedure that does not require you to be unconscious, such as:  Cataract surgery.  A dental procedure.  A biopsy.  A colonoscopy. During the procedure, you may receive a medicine to help you relax (sedative). There are three levels of sedation:  Mild sedation. At this level, you may feel awake and relaxed. You will be able to follow directions.  Moderate sedation. At this level, you will be sleepy. You may not remember the procedure.  Deep sedation. At this level, you will be asleep. You will not remember the procedure. The  more medicine you are given, the deeper your level of sedation will be. Depending on how you respond to the procedure, the anesthesia specialist may change your level of sedation or the type of anesthesia to fit your needs. An anesthesia specialist will monitor you closely during the procedure. Let your health care provider know about:  Any allergies you have.  All medicines you are taking, including vitamins, herbs, eye drops, creams, and over-the-counter medicines.  Any use of steroids (by mouth  or as a cream).  Any problems you or family members have had with sedatives and anesthetic medicines.  Any blood disorders you have.  Any surgeries you have had.  Any medical conditions you have, such as sleep apnea.  Whether you are pregnant or may be pregnant.  Any use of cigarettes, alcohol, or street drugs. What are the risks? Generally, this is a safe procedure. However, problems may occur, including:  Getting too much medicine (oversedation).  Nausea.  Allergic reaction to medicines.  Trouble breathing. If this happens, a breathing tube may be used to help with breathing. It will be removed when you are awake and breathing on your own.  Heart trouble.  Lung trouble. Before the procedure Staying hydrated  Follow instructions from your health care provider about hydration, which may include:  Up to 2 hours before the procedure - you may continue to drink clear liquids, such as water, clear fruit juice, black coffee, and plain tea. Eating and drinking restrictions  Follow instructions from your health care provider about eating and drinking, which may include:  8 hours before the procedure - stop eating heavy meals or foods such as meat, fried foods, or fatty foods.  6 hours before the procedure - stop eating light meals or foods, such as toast or cereal.  6 hours before the procedure - stop drinking milk or drinks that contain milk.  2 hours before the procedure - stop  drinking clear liquids. Medicines  Ask your health care provider about:  Changing or stopping your regular medicines. This is especially important if you are taking diabetes medicines or blood thinners.  Taking medicines such as aspirin and ibuprofen. These medicines can thin your blood. Do not take these medicines before your procedure if your health care provider instructs you not to. Tests and exams  You will have a physical exam.  You may have blood tests done to show:  How well your kidneys and liver are working.  How well your blood can clot.  General instructions  Plan to have someone take you home from the hospital or clinic.  If you will be going home right after the procedure, plan to have someone with you for 24 hours. What happens during the procedure?  Your blood pressure, heart rate, breathing, level of pain and overall condition will be monitored.  An IV tube will be inserted into one of your veins.  Your anesthesia specialist will give you medicines as needed to keep you comfortable during the procedure. This may mean changing the level of sedation.  The procedure will be performed. After the procedure  Your blood pressure, heart rate, breathing rate, and blood oxygen level will be monitored until the medicines you were given have worn off.  Do not drive for 24 hours if you received a sedative.  You may:  Feel sleepy, clumsy, or nauseous.  Feel forgetful about what happened after the procedure.  Have a sore throat if you had a breathing tube during the procedure.  Vomit. This information is not intended to replace advice given to you by your health care provider. Make sure you discuss any questions you have with your health care provider. Document Released: 05/13/2005 Document Revised: 01/24/2016 Document Reviewed: 12/08/2015 Elsevier Interactive Patient Education  2017 [place], Care After These instructions provide you  with information about caring for yourself after your procedure. Your health care provider may also give you more specific instructions. Your treatment has been planned according  to current medical practices, but problems sometimes occur. Call your health care provider if you have any problems or questions after your procedure. What can I expect after the procedure? After your procedure, it is common to:  Feel sleepy for several hours.  Feel clumsy and have poor balance for several hours.  Feel forgetful about what happened after the procedure.  Have poor judgment for several hours.  Feel nauseous or vomit.  Have a sore throat if you had a breathing tube during the procedure. Follow these instructions at home: For at least 24 hours after the procedure:   Do not:  Participate in activities in which you could fall or become injured.  Drive.  Use heavy machinery.  Drink alcohol.  Take sleeping pills or medicines that cause drowsiness.  Make important decisions or sign legal documents.  Take care of children on your own.  Rest. Eating and drinking  Follow the diet that is recommended by your health care provider.  If you vomit, drink water, juice, or soup when you can drink without vomiting.  Make sure you have little or no nausea before eating solid foods. General instructions  Have a responsible adult stay with you until you are awake and alert.  Take over-the-counter and prescription medicines only as told by your health care provider.  If you smoke, do not smoke without supervision.  Keep all follow-up visits as told by your health care provider. This is important. Contact a health care provider if:  You keep feeling nauseous or you keep vomiting.  You feel light-headed.  You develop a rash.  You have a fever. Get help right away if:  You have trouble breathing. This information is not intended to replace advice given to you by your health care provider.  Make sure you discuss any questions you have with your health care provider. Document Released: 12/08/2015 Document Revised: 04/08/2016 Document Reviewed: 12/08/2015 Elsevier Interactive Patient Education  2017 [employer].

## 2016-10-27 ENCOUNTER — Encounter (HOSPITAL_COMMUNITY): Payer: Self-pay

## 2016-10-27 ENCOUNTER — Encounter (HOSPITAL_COMMUNITY)
Admission: RE | Admit: 2016-10-27 | Discharge: 2016-10-27 | Disposition: A | Discharge status: Home / Self Care | Payer: Medicaid Other | Source: Ambulatory Visit | Attending: Internal Medicine | Admitting: Internal Medicine

## 2016-10-27 DIAGNOSIS — Z01818 Encounter for other preprocedural examination: Secondary | ICD-10-CM | POA: Insufficient documentation

## 2016-10-27 DIAGNOSIS — Z1211 Encounter for screening for malignant neoplasm of colon: Secondary | ICD-10-CM

## 2016-10-27 HISTORY — DX: Adverse effect of unspecified anesthetic, initial encounter: T41.45XA

## 2016-10-27 HISTORY — DX: Dyspnea, unspecified: R06.00

## 2016-10-27 HISTORY — DX: Other complications of anesthesia, initial encounter: T88.59XA

## 2016-10-27 LAB — CBC WITH DIFFERENTIAL/PLATELET
BASOS PCT: 1 %
Basophils Absolute: 0.1 10*3/uL (ref 0.0–0.1)
EOS ABS: 0.1 10*3/uL (ref 0.0–0.7)
EOS PCT: 1 %
HCT: 41.4 % (ref 39.0–52.0)
HEMOGLOBIN: 15.5 g/dL (ref 13.0–17.0)
LYMPHS PCT: 32 %
Lymphs Abs: 2 10*3/uL (ref 0.7–4.0)
MCH: 34.2 pg — ABNORMAL HIGH (ref 26.0–34.0)
MCHC: 37.4 g/dL — ABNORMAL HIGH (ref 30.0–36.0)
MCV: 91.4 fL (ref 78.0–100.0)
MONO ABS: 0.9 10*3/uL (ref 0.1–1.0)
Monocytes Relative: 14 %
NEUTROS PCT: 52 %
Neutro Abs: 3.3 10*3/uL (ref 1.7–7.7)
Platelets: 148 10*3/uL — ABNORMAL LOW (ref 150–400)
RBC: 4.53 MIL/uL (ref 4.22–5.81)
RDW: 12.9 % (ref 11.5–15.5)
WBC: 6.4 10*3/uL (ref 4.0–10.5)

## 2016-10-27 LAB — BASIC METABOLIC PANEL
ANION GAP: 13 (ref 5–15)
BUN: <5 mg/dL — ABNORMAL LOW (ref 6–20)
CHLORIDE: 90 mmol/L — AB (ref 101–111)
CO2: 21 mmol/L — ABNORMAL LOW (ref 22–32)
Calcium: 8.7 mg/dL — ABNORMAL LOW (ref 8.9–10.3)
Creatinine, Ser: 0.59 mg/dL — ABNORMAL LOW (ref 0.61–1.24)
GFR calc Af Amer: >60 mL/min (ref >60)
Glucose, Bld: 149 mg/dL — ABNORMAL HIGH (ref 65–99)
POTASSIUM: 3.1 mmol/L — AB (ref 3.5–5.1)
Sodium: 124 mmol/L — ABNORMAL LOW (ref 135–145)

## 2016-10-28 ENCOUNTER — Telehealth (INDEPENDENT_AMBULATORY_CARE_PROVIDER_SITE_OTHER): Payer: Self-pay | Admitting: *Deleted

## 2016-10-28 ENCOUNTER — Encounter (HOSPITAL_COMMUNITY): Payer: Self-pay

## 2016-10-28 NOTE — Telephone Encounter (Signed)
Patient had lab work prior to his upcoming procedure on 10/30/2016. His K+ was 3.1 Sodium was 124 These were called to office by [facility]. Per Dr. Laural Golden call in K+ 20 mEq - patient will take 2 by mouth today , and 2 by mouth tomorrow.  This Rx was called to [facility] and patient was called and made aware.

## 2016-10-28 NOTE — Pre-Procedure Instructions (Signed)
Potassium of 3.1 and sodium of 124 reported to Dr Patsey Berthold and Dr Laural Golden. Per Dr Olevia Perches request, called Tammy at his office and asked her to address these labs with treatment. Left Tammy a message and asked her to contact myself or Dr Laural Golden for questions.

## 2016-10-30 ENCOUNTER — Ambulatory Visit (HOSPITAL_COMMUNITY)
Admission: RE | Admit: 2016-10-30 | Discharge: 2016-10-30 | Disposition: A | Discharge status: Home / Self Care | Payer: Medicaid Other | Source: Ambulatory Visit | Attending: Internal Medicine | Admitting: Internal Medicine

## 2016-10-30 ENCOUNTER — Ambulatory Visit (HOSPITAL_COMMUNITY): Payer: Medicaid Other | Admitting: Anesthesiology

## 2016-10-30 ENCOUNTER — Encounter (HOSPITAL_COMMUNITY)
Admission: RE | Disposition: A | Discharge status: Home / Self Care | Payer: Self-pay | Source: Ambulatory Visit | Attending: Internal Medicine

## 2016-10-30 ENCOUNTER — Encounter (HOSPITAL_COMMUNITY): Payer: Self-pay | Admitting: *Deleted

## 2016-10-30 DIAGNOSIS — Z7982 Long term (current) use of aspirin: Secondary | ICD-10-CM | POA: Diagnosis not present

## 2016-10-30 DIAGNOSIS — K644 Residual hemorrhoidal skin tags: Secondary | ICD-10-CM

## 2016-10-30 DIAGNOSIS — Z1211 Encounter for screening for malignant neoplasm of colon: Secondary | ICD-10-CM | POA: Insufficient documentation

## 2016-10-30 DIAGNOSIS — F17210 Nicotine dependence, cigarettes, uncomplicated: Secondary | ICD-10-CM | POA: Diagnosis not present

## 2016-10-30 DIAGNOSIS — G8929 Other chronic pain: Secondary | ICD-10-CM | POA: Diagnosis not present

## 2016-10-30 DIAGNOSIS — Z79899 Other long term (current) drug therapy: Secondary | ICD-10-CM | POA: Diagnosis not present

## 2016-10-30 DIAGNOSIS — D125 Benign neoplasm of sigmoid colon: Secondary | ICD-10-CM | POA: Diagnosis not present

## 2016-10-30 DIAGNOSIS — Z8371 Family history of colonic polyps: Secondary | ICD-10-CM | POA: Diagnosis not present

## 2016-10-30 HISTORY — PX: POLYPECTOMY: SHX5525

## 2016-10-30 HISTORY — PX: COLONOSCOPY WITH PROPOFOL: SHX5780

## 2016-10-30 SURGERY — COLONOSCOPY WITH PROPOFOL
Anesthesia: Monitor Anesthesia Care

## 2016-10-30 MED ORDER — MIDAZOLAM HCL 5 MG/5ML IJ SOLN
INTRAMUSCULAR | Status: DC | PRN
  Administered 2016-10-30: 2 mg via INTRAVENOUS

## 2016-10-30 MED ORDER — MIDAZOLAM HCL 2 MG/2ML IJ SOLN
INTRAMUSCULAR | Status: AC
  Filled 2016-10-30: qty 2

## 2016-10-30 MED ORDER — PROPOFOL 500 MG/50ML IV EMUL
INTRAVENOUS | Status: DC | PRN
  Administered 2016-10-30: 125 ug/kg/min via INTRAVENOUS
  Administered 2016-10-30: 08:00:00 via INTRAVENOUS

## 2016-10-30 MED ORDER — LACTATED RINGERS IV SOLN
INTRAVENOUS | Status: DC
  Administered 2016-10-30: 1000 mL via INTRAVENOUS

## 2016-10-30 MED ORDER — FENTANYL CITRATE (PF) 100 MCG/2ML IJ SOLN
INTRAMUSCULAR | Status: AC
  Filled 2016-10-30: qty 2

## 2016-10-30 MED ORDER — CHLORHEXIDINE GLUCONATE CLOTH 2 % EX PADS: 6.0000 | MEDICATED_PAD | Freq: Once | CUTANEOUS | Status: DC

## 2016-10-30 MED ORDER — FENTANYL CITRATE (PF) 100 MCG/2ML IJ SOLN
25.0000 ug | INTRAMUSCULAR | Status: AC
  Administered 2016-10-30: 25 ug via INTRAVENOUS

## 2016-10-30 MED ORDER — EPHEDRINE SULFATE 50 MG/ML IJ SOLN
INTRAMUSCULAR | Status: DC | PRN
  Administered 2016-10-30: 10 mg via INTRAVENOUS

## 2016-10-30 MED ORDER — PROPOFOL 10 MG/ML IV BOLUS
INTRAVENOUS | Status: AC
  Filled 2016-10-30: qty 40

## 2016-10-30 MED ORDER — MIDAZOLAM HCL 2 MG/2ML IJ SOLN
1.0000 mg | INTRAMUSCULAR | Status: AC
  Administered 2016-10-30: 2 mg via INTRAVENOUS

## 2016-10-30 NOTE — Anesthesia Postprocedure Evaluation (Signed)
Anesthesia Post Note  Patient: Douglas Stout  Procedure(s) Performed: Procedure(s) (LRB): COLONOSCOPY WITH PROPOFOL (N/A) POLYPECTOMY  Patient location during evaluation: PACU Anesthesia Type: MAC Level of consciousness: awake, oriented and patient cooperative Pain management: pain level controlled Vital Signs Assessment: post-procedure vital signs reviewed and stable Respiratory status: spontaneous breathing, nonlabored ventilation and respiratory function stable Cardiovascular status: blood pressure returned to baseline Postop Assessment: no signs of nausea or vomiting Anesthetic complications: no     Last Vitals:  Vitals:   10/30/16 0720 10/30/16 0800  BP: 116/78 98/69  Pulse:  (!) 113  Resp: (!) 35   Temp:  (P) 36.8 C    Last Pain:  Vitals:   10/30/16 0728  TempSrc:   PainSc: 8                  Raniya Golembeski J

## 2016-10-30 NOTE — Transfer of Care (Signed)
Immediate Anesthesia Transfer of Care Note  Patient: Douglas Stout  Procedure(s) Performed: Procedure(s) with comments: COLONOSCOPY WITH PROPOFOL (N/A) - 7:30 POLYPECTOMY - colon  Patient Location: PACU  Anesthesia Type:MAC  Level of Consciousness: awake and patient cooperative  Airway & Oxygen Therapy: Patient Spontanous Breathing and Patient connected to face mask oxygen  Post-op Assessment: Report given to RN, Post -op Vital signs reviewed and stable and Patient moving all extremities  Post vital signs: Reviewed and stable  Last Vitals:  Vitals:   10/30/16 0715 10/30/16 0720  BP: 132/85 116/78  Pulse:    Resp: 20 (!) 35  Temp:      Last Pain:  Vitals:   10/30/16 0728  TempSrc:   PainSc: 8       Patients Stated Pain Goal: 8 (19/62/22 [id number])  Complications: No apparent anesthesia complications

## 2016-10-30 NOTE — Op Note (Signed)
[facility] Patient Name: Douglas Stout Procedure Date: 10/30/2016 7:23 AM MRN: [id number] Date of Birth: Jan 29, 1956 Attending MD: Hildred Laser , MD CSN: [id number] Age: 61 Admit Type: Outpatient Procedure:                Colonoscopy Indications:              Screening for colorectal malignant neoplasm Providers:                Hildred Laser, MD, Jeanann Lewandowsky. Sharon Seller, RN, Rosina Lowenstein, RN Referring MD:             Ralene Bathe. Dondiego, MD Medicines:                Propofol per Anesthesia Complications:            No immediate complications. Estimated Blood Loss:     Estimated blood loss was minimal. Procedure:                Pre-Anesthesia Assessment:                           - Prior to the procedure, a History and Physical                            was performed, and patient medications and                            allergies were reviewed. The patient's tolerance of                            previous anesthesia was also reviewed. The risks                            and benefits of the procedure and the sedation                            options and risks were discussed with the patient.                            All questions were answered, and informed consent                            was obtained. Prior Anticoagulants: The patient                            last took aspirin 2 days prior to the procedure.                            ASA Grade Assessment: II - A patient with mild                            systemic disease. After reviewing the risks and  benefits, the patient was deemed in satisfactory                            condition to undergo the procedure.                           After obtaining informed consent, the colonoscope                            was passed under direct vision. Throughout the                            procedure, the patient's blood pressure, pulse, and                            oxygen  saturations were monitored continuously. The                            [id number]) scope was introduced through                            the and advanced to the the cecum, identified by                            appendiceal orifice and ileocecal valve. The                            colonoscopy was performed without difficulty. The                            patient tolerated the procedure well. The quality                            of the bowel preparation was good. The ileocecal                            valve, appendiceal orifice, and rectum were                            photographed. Scope In: 7:35:18 AM Scope Out: 7:53:15 AM Scope Withdrawal Time: 0 hours 12 minutes 16 seconds  Total Procedure Duration: 0 hours 17 minutes 57 seconds  Findings:      The perianal and digital rectal examinations were normal.      A 5 mm polyp was found in the proximal sigmoid colon. The polyp was       sessile. The polyp was removed with a cold snare. Resection and       retrieval were complete.      External hemorrhoids were found during retroflexion. The hemorrhoids       were small. Impression:               - One 5 mm polyp in the proximal sigmoid colon,                            removed with  a cold snare. Resected and retrieved.                           - External hemorrhoids. Moderate Sedation:      Per Anesthesia Care Recommendation:           - Patient has a contact number available for                            emergencies. The signs and symptoms of potential                            delayed complications were discussed with the                            patient. Return to normal activities tomorrow.                            Written discharge instructions were provided to the                            patient.                           - Resume previous diet today.                           - Continue present medications.                           - Resume aspirin  at prior dose tomorrow.                           - Repeat colonoscopy date to be determined after                            pending pathology results are reviewed. Procedure Code(s):        --- Professional ---                           [id number], Colonoscopy, flexible; with removal of                            tumor(s), polyp(s), or other lesion(s) by snare                            technique Diagnosis Code(s):        --- Professional ---                           Z12.11, Encounter for screening for malignant                            neoplasm of colon                           D12.5, Benign neoplasm of sigmoid colon  K64.4, Residual hemorrhoidal skin tags CPT copyright 2016 American Medical Association. All rights reserved. The codes documented in this report are preliminary and upon coder review may  be revised to meet current compliance requirements. Hildred Laser, MD Hildred Laser, MD 10/30/2016 8:02:11 AM This report has been signed electronically. Number of Addenda: 0

## 2016-10-30 NOTE — H&P (Signed)
Douglas Stout is an 61 y.o. male.   Chief Complaint: Patient is here for colonoscopy. HPI: This 61 year old Caucasian male who is here for screening colonoscopy. He denies abdominal pain change in bowel habits or rectal bleeding. This is patient's first exam. He did take prep once in the past but decided at the last when not to undergo exam. Family history is negative for CRC. Has 2 brothers who have had colonic polyps.   Past Medical History:  Diagnosis Date  . Complication of anesthesia   . Dyspnea         Chronic Low back pain.  Past Surgical History:  Procedure Laterality Date  . BUNIONECTOMY Right     Family History  Problem Relation Age of Onset  . Cancer Mother    Social History:  reports that he has been smoking Cigarettes.  He has a 63.00 pack-year smoking history. He has never used smokeless tobacco. He reports that he drinks about 8.4 oz of alcohol per week . He reports that he does not use drugs.  Allergies: No Known Allergies  Medications Prior to Admission  Medication Sig Dispense Refill  . albuterol (PROVENTIL HFA;VENTOLIN HFA) 108 (90 Base) MCG/ACT inhaler Inhale 1 puff into the lungs every 6 (six) hours as needed for wheezing or shortness of breath.    Marland Kitchen aspirin EC 81 MG tablet Take 81 mg by mouth daily.    Marland Kitchen ibuprofen (ADVIL,MOTRIN) 200 MG tablet Take 800 mg by mouth every 6 (six) hours as needed for mild pain or moderate pain.    . Oxycodone HCl 10 MG TABS Take 10 mg by mouth 3 (three) times daily as needed.      No results found for this or any previous visit (from the past 48 hour(s)). No results found.  ROS  Blood pressure 113/74, pulse (!) 117, temperature 98.3 F (36.8 C), temperature source Oral, resp. rate (!) 30, height 5\' 8"  (1.727 m), weight 139 lb (63 kg), SpO2 97 %. Physical Exam  Constitutional:  Well-developed and Caucasian male in NAD.  HENT:  Mouth/Throat: Oropharynx is clear and moist.  Eyes: Conjunctivae are normal. No scleral icterus.   Neck: No thyromegaly present.  Cardiovascular: Normal rate, regular rhythm and normal heart sounds.   No murmur heard. Respiratory: Effort normal and breath sounds normal.  GI:  Abdomen is flat soft and nontender without organomegaly or masses  Musculoskeletal: He exhibits no edema.  Lymphadenopathy:    He has no cervical adenopathy.  Neurological: He is alert.  Skin: Skin is warm and dry.     Assessment/Plan Average risk screening colonoscopy.  Hildred Laser, MD 10/30/2016, 7:20 AM

## 2016-10-30 NOTE — Discharge Instructions (Signed)
Resume aspirin on 10/31/2016. Resume other medications and usual diet. No driving for 24 hours. Physician will call with biopsy results.   Colonoscopy, Adult, Care After This sheet gives you information about how to care for yourself after your procedure. Your health care provider may also give you more specific instructions. If you have problems or questions, contact your health care provider. What can I expect after the procedure? After the procedure, it is common to have:  A small amount of blood in your stool for 24 hours after the procedure.  Some gas.  Mild abdominal cramping or bloating. Follow these instructions at home: General instructions    For the first 24 hours after the procedure:  Do not drive or use machinery.  Do not sign important documents.  Do not drink alcohol.  Do your regular daily activities at a slower pace than normal.  Eat soft, easy-to-digest foods.  Rest often.  Take over-the-counter or prescription medicines only as told by your health care provider.  It is up to you to get the results of your procedure. Ask your health care provider, or the department performing the procedure, when your results will be ready. Relieving cramping and bloating   Try walking around when you have cramps or feel bloated.  Apply heat to your abdomen as told by your health care provider. Use a heat source that your health care provider recommends, such as a moist heat pack or a heating pad.  Place a towel between your skin and the heat source.  Leave the heat on for 20-30 minutes.  Remove the heat if your skin turns bright red. This is especially important if you are unable to feel pain, heat, or cold. You may have a greater risk of getting burned. Eating and drinking   Drink enough fluid to keep your urine clear or pale yellow.  Resume your normal diet as instructed by your health care provider. Avoid heavy or fried foods that are hard to digest.  Avoid  drinking alcohol for as long as instructed by your health care provider. Contact a health care provider if:  You have blood in your stool 2-3 days after the procedure. Get help right away if:  You have more than a small spotting of blood in your stool.  You pass large blood clots in your stool.  Your abdomen is swollen.  You have nausea or vomiting.  You have a fever.  You have increasing abdominal pain that is not relieved with medicine. This information is not intended to replace advice given to you by your health care provider. Make sure you discuss any questions you have with your health care provider. Document Released: 03/31/2004 Document Revised: 05/11/2016 Document Reviewed: 10/29/2015 Elsevier Interactive Patient Education  2017 [employer].

## 2016-10-30 NOTE — Anesthesia Preprocedure Evaluation (Signed)
Anesthesia Evaluation  Patient identified by MRN, date of birth, ID band Patient awake    Airway Mallampati: II  TM Distance: >3 FB     Dental  (+) Edentulous Upper, Edentulous Lower   Pulmonary shortness of breath and with exertion, Current Smoker,    breath sounds clear to auscultation       Cardiovascular negative cardio ROS   Rhythm:Regular Rate:Normal     Neuro/Psych    GI/Hepatic (+)     substance abuse  alcohol use,   Endo/Other    Renal/GU      Musculoskeletal   Abdominal   Peds  Hematology   Anesthesia Other Findings   Reproductive/Obstetrics                             Anesthesia Physical Anesthesia Plan  ASA: III  Anesthesia Plan: MAC   Post-op Pain Management:    Induction: Intravenous  Airway Management Planned: Simple Face Mask  Additional Equipment:   Intra-op Plan:   Post-operative Plan:   Informed Consent: I have reviewed the patients History and Physical, chart, labs and discussed the procedure including the risks, benefits and alternatives for the proposed anesthesia with the patient or authorized representative who has indicated his/her understanding and acceptance.     Plan Discussed with:   Anesthesia Plan Comments:         Anesthesia Quick Evaluation

## 2016-11-02 ENCOUNTER — Encounter (HOSPITAL_COMMUNITY): Payer: Self-pay | Admitting: Internal Medicine

## 2017-02-05 ENCOUNTER — Emergency Department (HOSPITAL_COMMUNITY): Payer: Self-pay

## 2017-02-05 ENCOUNTER — Encounter (HOSPITAL_COMMUNITY): Payer: Self-pay

## 2017-02-05 ENCOUNTER — Emergency Department (HOSPITAL_COMMUNITY)
Admission: EM | Admit: 2017-02-05 | Discharge: 2017-02-06 | Disposition: A | Discharge status: Home / Self Care | Payer: Self-pay | Attending: Emergency Medicine | Admitting: Emergency Medicine

## 2017-02-05 DIAGNOSIS — S0083XA Contusion of other part of head, initial encounter: Secondary | ICD-10-CM | POA: Insufficient documentation

## 2017-02-05 DIAGNOSIS — F10920 Alcohol use, unspecified with intoxication, uncomplicated: Secondary | ICD-10-CM

## 2017-02-05 DIAGNOSIS — I951 Orthostatic hypotension: Secondary | ICD-10-CM | POA: Insufficient documentation

## 2017-02-05 DIAGNOSIS — R079 Chest pain, unspecified: Secondary | ICD-10-CM

## 2017-02-05 DIAGNOSIS — Y999 Unspecified external cause status: Secondary | ICD-10-CM | POA: Insufficient documentation

## 2017-02-05 DIAGNOSIS — Y929 Unspecified place or not applicable: Secondary | ICD-10-CM | POA: Insufficient documentation

## 2017-02-05 DIAGNOSIS — F10120 Alcohol abuse with intoxication, uncomplicated: Secondary | ICD-10-CM | POA: Insufficient documentation

## 2017-02-05 DIAGNOSIS — R0600 Dyspnea, unspecified: Secondary | ICD-10-CM

## 2017-02-05 DIAGNOSIS — F17210 Nicotine dependence, cigarettes, uncomplicated: Secondary | ICD-10-CM | POA: Insufficient documentation

## 2017-02-05 DIAGNOSIS — Y939 Activity, unspecified: Secondary | ICD-10-CM | POA: Insufficient documentation

## 2017-02-05 DIAGNOSIS — W1800XA Striking against unspecified object with subsequent fall, initial encounter: Secondary | ICD-10-CM | POA: Insufficient documentation

## 2017-02-05 DIAGNOSIS — R0789 Other chest pain: Secondary | ICD-10-CM | POA: Insufficient documentation

## 2017-02-05 LAB — CBC WITH DIFFERENTIAL/PLATELET
BASOS PCT: 1 %
Basophils Absolute: 0 10*3/uL (ref 0.0–0.1)
EOS PCT: 0 %
Eosinophils Absolute: 0 10*3/uL (ref 0.0–0.7)
HCT: 35.1 % — ABNORMAL LOW (ref 39.0–52.0)
Hemoglobin: 12.9 g/dL — ABNORMAL LOW (ref 13.0–17.0)
Lymphocytes Relative: 32 %
Lymphs Abs: 1.9 10*3/uL (ref 0.7–4.0)
MCH: 34.5 pg — ABNORMAL HIGH (ref 26.0–34.0)
MCHC: 36.8 g/dL — AB (ref 30.0–36.0)
MCV: 93.9 fL (ref 78.0–100.0)
MONO ABS: 0.6 10*3/uL (ref 0.1–1.0)
Monocytes Relative: 10 %
Neutro Abs: 3.3 10*3/uL (ref 1.7–7.7)
Neutrophils Relative %: 57 %
PLATELETS: 138 10*3/uL — AB (ref 150–400)
RBC: 3.74 MIL/uL — ABNORMAL LOW (ref 4.22–5.81)
RDW: 12.3 % (ref 11.5–15.5)
WBC: 5.8 10*3/uL (ref 4.0–10.5)

## 2017-02-05 LAB — BASIC METABOLIC PANEL
Anion gap: 15 (ref 5–15)
BUN: 5 mg/dL — ABNORMAL LOW (ref 6–20)
CALCIUM: 8.2 mg/dL — AB (ref 8.9–10.3)
CO2: 20 mmol/L — ABNORMAL LOW (ref 22–32)
CREATININE: 0.63 mg/dL (ref 0.61–1.24)
Chloride: 93 mmol/L — ABNORMAL LOW (ref 101–111)
GLUCOSE: 96 mg/dL (ref 65–99)
Potassium: 3.5 mmol/L (ref 3.5–5.1)
Sodium: 128 mmol/L — ABNORMAL LOW (ref 135–145)

## 2017-02-05 LAB — TROPONIN I: Troponin I: <0.03 ng/mL (ref <0.03)

## 2017-02-05 LAB — D-DIMER, QUANTITATIVE (NOT AT ARMC): D DIMER QUANT: 1.16 ug{FEU}/mL — AB (ref 0.00–0.50)

## 2017-02-05 MED ORDER — IOPAMIDOL (ISOVUE-370) INJECTION 76%
100.0000 mL | Freq: Once | INTRAVENOUS | Status: AC | PRN
  Administered 2017-02-05: 100 mL via INTRAVENOUS

## 2017-02-05 MED ORDER — MORPHINE SULFATE (PF) 4 MG/ML IV SOLN
4.0000 mg | Freq: Once | INTRAVENOUS | Status: AC
  Administered 2017-02-05: 4 mg via INTRAVENOUS
  Filled 2017-02-05: qty 1

## 2017-02-05 MED ORDER — ALBUTEROL SULFATE (2.5 MG/3ML) 0.083% IN NEBU
5.0000 mg | INHALATION_SOLUTION | Freq: Once | RESPIRATORY_TRACT | Status: AC
  Administered 2017-02-05: 5 mg via RESPIRATORY_TRACT
  Filled 2017-02-05: qty 6

## 2017-02-05 NOTE — ED Provider Notes (Addendum)
[facility] DEPT Provider Note   CSN: [id number] Arrival date & time: 02/05/17  2130     History   Chief Complaint Chief Complaint  Patient presents with  . Shortness of Breath    HPI Douglas Stout is a 61 y.o. male.  Patient presents with anterior chest pain described as pressure with associated dyspnea and diaphoresis approximately 1 hour prior to arrival while drinking beer. He was given aspirin and 1 nitroglycerin by EMS. No previous cardiac history. He smokes cigarettes. No known history of diabetes or hypertension.      Past Medical History:  Diagnosis Date  . Complication of anesthesia   . Dyspnea     Patient Active Problem List   Diagnosis Date Noted  . Special screening for malignant neoplasms, colon 10/07/2016    Past Surgical History:  Procedure Laterality Date  . BUNIONECTOMY Right   . COLONOSCOPY WITH PROPOFOL N/A 10/30/2016   Procedure: COLONOSCOPY WITH PROPOFOL;  Surgeon: Rogene Houston, MD;  Location: [facility];  Service: Endoscopy;  Laterality: N/A;  7:30  . POLYPECTOMY  10/30/2016   Procedure: POLYPECTOMY;  Surgeon: Rogene Houston, MD;  Location: [facility];  Service: Endoscopy;;  colon       Home Medications    Prior to Admission medications   Medication Sig Start Date End Date Taking? Authorizing Provider  albuterol (PROVENTIL HFA;VENTOLIN HFA) 108 (90 Base) MCG/ACT inhaler Inhale 1 puff into the lungs every 6 (six) hours as needed for wheezing or shortness of breath.    [provider]    Family History Family History  Problem Relation Age of Onset  . Cancer Mother     Social History Social History  Substance Use Topics  . Smoking status: Current Every Day Smoker    Packs/day: 1.50    Years: 42.00    Types: Cigarettes  . Smokeless tobacco: Never Used  . Alcohol use 8.4 oz/week    14 Cans of beer per week     Comment: admits to drinking today-2 4o oz nightly     Allergies   Patient has no known  allergies.   Review of Systems Review of Systems  All other systems reviewed and are negative.    Physical Exam Updated Vital Signs BP 132/75 (BP Location: Right Arm)   Pulse (!) 108   Temp 98.1 F (36.7 C) (Oral)   Resp 20   Wt 56.7 kg (125 lb)   SpO2 98%   BMI 19.01 kg/m   Physical Exam  Constitutional: He is oriented to person, place, and time.  Pale, tachycardic  HENT:  Head: Normocephalic and atraumatic.  Eyes: Conjunctivae are normal.  Neck: Neck supple.  Cardiovascular: Normal rate and regular rhythm.   Pulmonary/Chest: Effort normal and breath sounds normal.  Abdominal: Soft. Bowel sounds are normal.  Musculoskeletal: Normal range of motion.  Neurological: He is alert and oriented to person, place, and time.  Skin: Skin is warm and dry.  Psychiatric: He has a normal mood and affect. His behavior is normal.  Nursing note and vitals reviewed.    ED Treatments / Results  Labs (all labs ordered are listed, but only abnormal results are displayed) Labs Reviewed  CBC WITH DIFFERENTIAL/PLATELET - Abnormal; Notable for the following:       Result Value   RBC 3.74 (*)    Hemoglobin 12.9 (*)    HCT 35.1 (*)    MCH 34.5 (*)    MCHC 36.8 (*)  Platelets 138 (*)    All other components within normal limits  BASIC METABOLIC PANEL - Abnormal; Notable for the following:    Sodium 128 (*)    Chloride 93 (*)    CO2 20 (*)    BUN 5 (*)    Calcium 8.2 (*)    All other components within normal limits  D-DIMER, QUANTITATIVE (NOT AT [facility]) - Abnormal; Notable for the following:    D-Dimer, Quant 1.16 (*)    All other components within normal limits  TROPONIN I    EKG  EKG Interpretation  Date/Time:  Friday February 05 2017 21:34:38 EDT Ventricular Rate:  121 PR Interval:    QRS Duration: 100 QT Interval:  331 QTC Calculation: 470 R Axis:   103 Text Interpretation:  Sinus tachycardia Anteroseptal infarct, age indeterminate Lateral leads are also involved  Baseline wander in lead(s) I III aVL Confirmed by Nat Christen [phone]) on 02/05/2017 10:05:11 PM       Radiology Dg Chest 2 View  Result Date: 02/05/2017 CLINICAL DATA:  Shortness of breath with central chest pain EXAM: CHEST  2 VIEW COMPARISON:  12/10/2015 FINDINGS: Hyperinflation. No focal consolidation or pleural effusion. Stable cardiomediastinal silhouette with mild atherosclerosis. No pneumothorax. Old appearing deformity of the proximal humerus on the left. Mild to moderate compression at approximate T12, unchanged IMPRESSION: No active cardiopulmonary disease. Electronically Signed   By: Donavan Foil M.D.   On: 02/05/2017 22:46    Procedures Procedures (including critical care time)  Medications Ordered in ED Medications  iopamidol (ISOVUE-370) 76 % injection 100 mL (not administered)  albuterol (PROVENTIL) (2.5 MG/3ML) 0.083% nebulizer solution 5 mg (5 mg Nebulization Given 02/05/17 2243)  morphine 4 MG/ML injection 4 mg (4 mg Intravenous Given 02/05/17 2219)     Initial Impression / Assessment and Plan / ED Course  I have reviewed the triage vital signs and the nursing notes.  Pertinent labs & imaging results that were available during my care of the patient were reviewed by me and considered in my medical decision making (see chart for details).     Patient presents with chest pain and dyspnea. EKG shows sinus tachycardia rate 121 and chest x-ray negative. First troponin negative. D-dimer 1.16. Will obtain CT angiogram of chest and delta troponin. Discussed with Dr. Eliane Decree.  Final Clinical Impressions(s) / ED Diagnoses   Final diagnoses:  Dyspnea  Chest pain, unspecified type    New Prescriptions New Prescriptions   No medications on file     Nat Christen, MD 02/05/17 [place]    Nat Christen, MD 02/06/17 [phone]

## 2017-02-05 NOTE — ED Triage Notes (Signed)
SOB x 1 hour. Pt was drinking beer on onset. C/o of heard breathing with some pressure on chest.  GIVEN BY EMS Aspirin 324 given. 1 Nitro given .4

## 2017-02-06 ENCOUNTER — Emergency Department (HOSPITAL_COMMUNITY): Payer: Self-pay

## 2017-02-06 LAB — TROPONIN I

## 2017-02-06 LAB — ETHANOL: ALCOHOL ETHYL (B): 339 mg/dL — AB (ref <5)

## 2017-02-06 MED ORDER — SODIUM CHLORIDE 0.9 % IV BOLUS (SEPSIS)
1000.0000 mL | Freq: Once | INTRAVENOUS | Status: AC
  Administered 2017-02-06: 1000 mL via INTRAVENOUS

## 2017-02-06 NOTE — ED Notes (Addendum)
Date and time results received: 02/06/17 0457 (use smartphrase ".now" to insert current time)  Test: alcohol Critical Value: 339  Name of Provider Notified: DR Tomi Bamberger  Orders Received? Or Actions Taken?: Actions Taken: notified MD

## 2017-02-06 NOTE — ED Notes (Signed)
Pt ambulated from room to nurses stationed. O2 100-96 Pulse 85 but when up to 116 with c/o dizziness. Pt assted back to room.

## 2017-02-06 NOTE — ED Provider Notes (Signed)
Patient was left at change of shift to get results of his CT angiogram of the chest and a delta troponin. Patient states he had acute onset of pain in the center chest that was sharp with shortness of breath earlier this evening. He states he has only had one 40 ounces of beer tonight, he normally drinks 80 ounces. He also reports the night before he had fallen and hit his head and he does have an abrasion on his right forehead. He complains of feeling dizzy and lightheaded when he stands up. When the nursing staff did orthostatic vital signs on him his blood pressure did drop into the 90s and his heart rate got up to 108 when he stood up. He was ambulated by nursing staff and his pulse ox remained 96-100% however he did become tachycardic and felt dizzy. Patient is noted to have a superficial abrasion on his right forehead. CT of the head was ordered. We discussed the CT angiogram results and his delta troponin.  Pt smokes 2 ppd   Orthostatic VS for the past 24 hrs:  BP- Lying Pulse- Lying BP- Sitting Pulse- Sitting BP- Standing at 0 minutes Pulse- Standing at 0 minutes  02/06/17 0257 110/72 94 107/77 93 95/70 108    Orthostatic VS for the past 24 NFA:OZHYQ 1 L IV fluid, patient states when he stands up he feels dizzy. Patient was given second liter of IV fluid.  BP- Lying Pulse- Lying BP- Sitting Pulse- Sitting BP- Standing at 0 minutes Pulse- Standing at 0 minutes  02/06/17 0557 144/90 98 (!) 148/91 96 144/88 102    07:10 AM Patient states he's feeling much better now after the 2 L of IV fluids. We discussed him being discharged home. He states he thinks he had a panic attack which he's had before.   Results for orders placed or performed during the hospital encounter of 02/05/17  CBC with Differential  Result Value Ref Range   WBC 5.8 4.0 - 10.5 K/uL   RBC 3.74 (L) 4.22 - 5.81 MIL/uL   Hemoglobin 12.9 (L) 13.0 - 17.0 g/dL   HCT 35.1 (L) 39.0 - 52.0 %   MCV 93.9 78.0 - 100.0 fL   MCH  34.5 (H) 26.0 - 34.0 pg   MCHC 36.8 (H) 30.0 - 36.0 g/dL   RDW 12.3 11.5 - 15.5 %   Platelets 138 (L) 150 - 400 K/uL   Neutrophils Relative % 57 %   Neutro Abs 3.3 1.7 - 7.7 K/uL   Lymphocytes Relative 32 %   Lymphs Abs 1.9 0.7 - 4.0 K/uL   Monocytes Relative 10 %   Monocytes Absolute 0.6 0.1 - 1.0 K/uL   Eosinophils Relative 0 %   Eosinophils Absolute 0.0 0.0 - 0.7 K/uL   Basophils Relative 1 %   Basophils Absolute 0.0 0.0 - 0.1 K/uL  Basic metabolic panel  Result Value Ref Range   Sodium 128 (L) 135 - 145 mmol/L   Potassium 3.5 3.5 - 5.1 mmol/L   Chloride 93 (L) 101 - 111 mmol/L   CO2 20 (L) 22 - 32 mmol/L   Glucose, Bld 96 65 - 99 mg/dL   BUN 5 (L) 6 - 20 mg/dL   Creatinine, Ser 0.63 0.61 - 1.24 mg/dL   Calcium 8.2 (L) 8.9 - 10.3 mg/dL   GFR calc non Af Amer >60 >60 mL/min   GFR calc Af Amer >60 >60 mL/min   Anion gap 15 5 - 15  Troponin I  Result Value Ref Range   Troponin I <0.03 <0.03 ng/mL  D-dimer, quantitative (not at [facility])  Result Value Ref Range   D-Dimer, Quant 1.16 (H) 0.00 - 0.50 ug/mL-FEU  Troponin I  Result Value Ref Range   Troponin I <0.03 <0.03 ng/mL  Ethanol  Result Value Ref Range   Alcohol, Ethyl (B) 339 (HH) <5 mg/dL   Laboratory interpretation all normal except Elevated d-dimer, chronic hyponatremia, acute alcohol intoxication   Dg Chest 2 View  Result Date: 02/05/2017 CLINICAL DATA:  Shortness of breath with central chest pain EXAM: CHEST  2 VIEW COMPARISON:  12/10/2015 FINDINGS: Hyperinflation. No focal consolidation or pleural effusion. Stable cardiomediastinal silhouette with mild atherosclerosis. No pneumothorax. Old appearing deformity of the proximal humerus on the left. Mild to moderate compression at approximate T12, unchanged IMPRESSION: No active cardiopulmonary disease. Electronically Signed   By: Donavan Foil M.D.   On: 02/05/2017 22:46   Ct Head Wo Contrast  Result Date: 02/06/2017 CLINICAL DATA:  Fall 2 days prior striking head  last night. Dizziness. Alcoholic. EXAM: CT HEAD WITHOUT CONTRAST TECHNIQUE: Contiguous axial images were obtained from the base of the skull through the vertex without intravenous contrast. COMPARISON:  Head CT 06/17/2015 FINDINGS: Brain: No evidence of acute infarction, hemorrhage, hydrocephalus, extra-axial collection or mass lesion/mass effect. Generalized cerebral and cerebellar atrophy. Mild chronic small vessel ischemia. Vascular: Minimal intravascular contrast from IV contrast injection for chest CT yesterday allowing for this, no evidence of hyperdense vessel. Atherosclerosis of skullbase vasculature. Skull: No fracture or focal lesion. Sinuses/Orbits: Chronic mucosal thickening of left maxillary sinus. Chronic mucosal thickening of right frontal sinus and anterior ethmoid air cells mastoid air cells are clear. Visualized orbits are unremarkable. Other: None. IMPRESSION: 1.  No acute intracranial abnormality.  No skull fracture. 2. Mild atrophy and chronic small vessel ischemia. 3. 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 Electronically Signed   By: Jeb Levering M.D.   On: 02/06/2017 04:40   Ct Angio Chest Pe W Or Wo Contrast  Result Date: 02/06/2017 CLINICAL DATA:  Dyspnea.  Tachycardia. EXAM: CT ANGIOGRAPHY CHEST WITH CONTRAST TECHNIQUE: Multidetector CT imaging of the chest was performed using the standard protocol during bolus administration of intravenous contrast. Multiplanar CT image reconstructions and MIPs were obtained to evaluate the vascular anatomy. CONTRAST:  100 cc Isovue 370 IV COMPARISON:  Chest radiographs earlier this day. FINDINGS: Cardiovascular: There are no filling defects within the pulmonary arteries to suggest pulmonary embolus. Atherosclerosis of the thoracic aorta which is normal in caliber. No dissection. There are coronary artery calcifications. The heart is normal in size. No pericardial effusion. Mediastinum/Nodes: Small mediastinal nodes not enlarged by size  criteria. No hilar adenopathy. Visualized thyroid gland is normal. The esophagus is decompressed. Lungs/Pleura: Mild emphysema. No consolidation. No pulmonary edema. No pleural effusion. No pulmonary mass or suspicious nodule. Upper Abdomen: Suspect hepatic steatosis. Dilatation of both renal collecting systems, left greater than right with left renal atrophy. This is unchanged from abdominal CT 11/24/2011. Musculoskeletal: Chronic compression fracture T12. There are no acute or suspicious osseous abnormalities. Review of the MIP images confirms the above findings. IMPRESSION: 1. No pulmonary embolus or acute abnormality. 2. Mild emphysema. 3. Thoracic aortic atherosclerosis.  Coronary artery calcifications. Electronically Signed   By: Jeb Levering M.D.   On: 02/06/2017 00:21    Review of the [place] shows patient gets #90 oxycodone 10 mg tablets monthly from his PCP, last filled May 8.   Diagnoses that have  been ruled out:  None  Diagnoses that are still under consideration:  None  Final diagnoses:  Dyspnea  Chest pain, unspecified type  Alcoholic intoxication without complication (HCC)  Contusion of forehead, initial encounter    Plan discharge   Rolland Porter, MD, Barbette Or, MD 02/06/17 [phone]

## 2017-02-06 NOTE — Discharge Instructions (Signed)
You need to stop drinking so much especially with the pain pills you take. You can take acetaminophen for pain as needed. Try to drink more non-alcoholic fluids and eat a balanced diet. Follow up with Dr Cindie Laroche if you aren't feeling back to normal this week. Return to the ED for any problems listed on the head injury sheet.

## 2017-02-25 ENCOUNTER — Encounter (HOSPITAL_COMMUNITY): Payer: Self-pay

## 2017-02-25 ENCOUNTER — Emergency Department (HOSPITAL_COMMUNITY)
Admission: EM | Admit: 2017-02-25 | Discharge: 2017-02-25 | Disposition: A | Discharge status: Home / Self Care | Payer: Medicaid Other | Attending: Emergency Medicine | Admitting: Emergency Medicine

## 2017-02-25 DIAGNOSIS — Z5321 Procedure and treatment not carried out due to patient leaving prior to being seen by health care provider: Secondary | ICD-10-CM | POA: Insufficient documentation

## 2017-02-25 DIAGNOSIS — R4182 Altered mental status, unspecified: Secondary | ICD-10-CM | POA: Insufficient documentation

## 2017-02-25 HISTORY — DX: Alcohol dependence, uncomplicated: F10.20

## 2017-02-25 HISTORY — DX: Reserved for inherently not codable concepts without codable children: IMO0001

## 2017-02-25 LAB — BASIC METABOLIC PANEL
Anion gap: 11 (ref 5–15)
BUN: 5 mg/dL — AB (ref 6–20)
CO2: 23 mmol/L (ref 22–32)
CREATININE: 0.69 mg/dL (ref 0.61–1.24)
Calcium: 8.8 mg/dL — ABNORMAL LOW (ref 8.9–10.3)
Chloride: 92 mmol/L — ABNORMAL LOW (ref 101–111)
GFR calc Af Amer: >60 mL/min (ref >60)
GLUCOSE: 126 mg/dL — AB (ref 65–99)
Potassium: 3.8 mmol/L (ref 3.5–5.1)
SODIUM: 126 mmol/L — AB (ref 135–145)

## 2017-02-25 LAB — ETHANOL: ALCOHOL ETHYL (B): 263 mg/dL — AB (ref <5)

## 2017-02-25 LAB — CBC WITH DIFFERENTIAL/PLATELET
Basophils Absolute: 0.1 10*3/uL (ref 0.0–0.1)
Basophils Relative: 1 %
EOS ABS: 0 10*3/uL (ref 0.0–0.7)
EOS PCT: 0 %
HCT: 39.1 % (ref 39.0–52.0)
Hemoglobin: 14.4 g/dL (ref 13.0–17.0)
LYMPHS ABS: 2 10*3/uL (ref 0.7–4.0)
LYMPHS PCT: 26 %
MCH: 34.5 pg — AB (ref 26.0–34.0)
MCHC: 36.8 g/dL — AB (ref 30.0–36.0)
MCV: 93.8 fL (ref 78.0–100.0)
MONO ABS: 0.7 10*3/uL (ref 0.1–1.0)
MONOS PCT: 10 %
Neutro Abs: 4.9 10*3/uL (ref 1.7–7.7)
Neutrophils Relative %: 64 %
PLATELETS: 223 10*3/uL (ref 150–400)
RBC: 4.17 MIL/uL — ABNORMAL LOW (ref 4.22–5.81)
RDW: 12.5 % (ref 11.5–15.5)
WBC: 7.7 10*3/uL (ref 4.0–10.5)

## 2017-02-25 LAB — RAPID URINE DRUG SCREEN, HOSP PERFORMED
AMPHETAMINES: NOT DETECTED
BARBITURATES: NOT DETECTED
Benzodiazepines: NOT DETECTED
Cocaine: NOT DETECTED
Opiates: NOT DETECTED
Tetrahydrocannabinol: NOT DETECTED

## 2017-02-25 NOTE — ED Notes (Signed)
Pt over heard talking to family on phone.  Pt states he is leaving.  Pt seen ambulating from ED by [facility] standing at [employer] station

## 2017-02-25 NOTE — ED Notes (Signed)
Chased pt down hall after seeing him trying to leave. He stated "I can go wherever the hell I want." Pt proceeded to leave down hallway

## 2017-02-25 NOTE — ED Triage Notes (Signed)
Patient has been drinking today, drank about 14 beers today.  States that he thinks he is going into DT[facility]. I am seeing spiders per pt.  Patient states that he is an alcoholic.

## 2018-01-11 ENCOUNTER — Emergency Department (HOSPITAL_COMMUNITY): Payer: Medicare HMO

## 2018-01-11 ENCOUNTER — Emergency Department (HOSPITAL_COMMUNITY)
Admission: EM | Admit: 2018-01-11 | Discharge: 2018-01-11 | Disposition: A | Discharge status: Home / Self Care | Payer: Medicare HMO | Attending: Emergency Medicine | Admitting: Emergency Medicine

## 2018-01-11 ENCOUNTER — Other Ambulatory Visit: Payer: Self-pay

## 2018-01-11 ENCOUNTER — Encounter (HOSPITAL_COMMUNITY): Payer: Self-pay | Admitting: *Deleted

## 2018-01-11 DIAGNOSIS — Y92003 Bedroom of unspecified non-institutional (private) residence as the place of occurrence of the external cause: Secondary | ICD-10-CM | POA: Insufficient documentation

## 2018-01-11 DIAGNOSIS — M25521 Pain in right elbow: Secondary | ICD-10-CM | POA: Diagnosis not present

## 2018-01-11 DIAGNOSIS — F10920 Alcohol use, unspecified with intoxication, uncomplicated: Secondary | ICD-10-CM | POA: Diagnosis not present

## 2018-01-11 DIAGNOSIS — S5002XA Contusion of left elbow, initial encounter: Secondary | ICD-10-CM

## 2018-01-11 DIAGNOSIS — S0993XA Unspecified injury of face, initial encounter: Secondary | ICD-10-CM | POA: Diagnosis present

## 2018-01-11 DIAGNOSIS — F17210 Nicotine dependence, cigarettes, uncomplicated: Secondary | ICD-10-CM | POA: Insufficient documentation

## 2018-01-11 DIAGNOSIS — S0181XA Laceration without foreign body of other part of head, initial encounter: Secondary | ICD-10-CM | POA: Diagnosis not present

## 2018-01-11 DIAGNOSIS — S60811A Abrasion of right wrist, initial encounter: Secondary | ICD-10-CM | POA: Insufficient documentation

## 2018-01-11 DIAGNOSIS — Y999 Unspecified external cause status: Secondary | ICD-10-CM | POA: Diagnosis not present

## 2018-01-11 DIAGNOSIS — Y9384 Activity, sleeping: Secondary | ICD-10-CM | POA: Insufficient documentation

## 2018-01-11 DIAGNOSIS — S51812S Laceration without foreign body of left forearm, sequela: Secondary | ICD-10-CM

## 2018-01-11 DIAGNOSIS — W06XXXA Fall from bed, initial encounter: Secondary | ICD-10-CM | POA: Insufficient documentation

## 2018-01-11 DIAGNOSIS — Z23 Encounter for immunization: Secondary | ICD-10-CM | POA: Insufficient documentation

## 2018-01-11 DIAGNOSIS — R51 Headache: Secondary | ICD-10-CM | POA: Diagnosis not present

## 2018-01-11 DIAGNOSIS — S51802A Unspecified open wound of left forearm, initial encounter: Secondary | ICD-10-CM | POA: Insufficient documentation

## 2018-01-11 DIAGNOSIS — Z79899 Other long term (current) drug therapy: Secondary | ICD-10-CM | POA: Diagnosis not present

## 2018-01-11 DIAGNOSIS — S50812A Abrasion of left forearm, initial encounter: Secondary | ICD-10-CM

## 2018-01-11 DIAGNOSIS — S51801A Unspecified open wound of right forearm, initial encounter: Secondary | ICD-10-CM | POA: Diagnosis not present

## 2018-01-11 DIAGNOSIS — S42032A Displaced fracture of lateral end of left clavicle, initial encounter for closed fracture: Secondary | ICD-10-CM | POA: Diagnosis not present

## 2018-01-11 MED ORDER — LIDOCAINE-EPINEPHRINE (PF) 2 %-1:200000 IJ SOLN
10.0000 mL | Freq: Once | INTRAMUSCULAR | Status: DC
  Filled 2018-01-11: qty 20

## 2018-01-11 MED ORDER — ACETAMINOPHEN 325 MG PO TABS
650.0000 mg | ORAL_TABLET | Freq: Once | ORAL | Status: AC
  Administered 2018-01-11: 650 mg via ORAL
  Filled 2018-01-11: qty 2

## 2018-01-11 MED ORDER — POVIDONE-IODINE 10 % EX SOLN
CUTANEOUS | Status: AC
  Filled 2018-01-11: qty 15

## 2018-01-11 MED ORDER — TETANUS-DIPHTH-ACELL PERTUSSIS 5-2.5-18.5 LF-MCG/0.5 IM SUSP
0.5000 mL | Freq: Once | INTRAMUSCULAR | Status: AC
  Administered 2018-01-11: 0.5 mL via INTRAMUSCULAR
  Filled 2018-01-11: qty 0.5

## 2018-01-11 NOTE — ED Provider Notes (Addendum)
[facility] EMERGENCY DEPARTMENT Provider Note   CSN: [id number] Arrival date & time: 01/11/18  0355  Time seen 05:00 AM   History   Chief Complaint Chief Complaint  Patient presents with  . Laceration    HPI Douglas Stout is a 62 y.o. male.  HPI patient reports he was sleeping and he fell out of bed.  He states he only had 2 beers "earlier" in the evening.  He is unsure of loss of consciousness.  He complains of pain in his left elbow.  He also has a laceration on his right forehead.  He has some skin tears on his arms.  He states he is having a headache but denies any neck pain.  He also complains of pain in his left posterior shoulder.  He states he does not remember getting a tetanus shot since he was a child.  PCP Lucia Gaskins, MD   Past Medical History:  Diagnosis Date  . Alcoholism /alcohol abuse ([place])   . Complication of anesthesia   . Dyspnea     Patient Active Problem List   Diagnosis Date Noted  . Special screening for malignant neoplasms, colon 10/07/2016    Past Surgical History:  Procedure Laterality Date  . BUNIONECTOMY Right   . COLONOSCOPY WITH PROPOFOL N/A 10/30/2016   Procedure: COLONOSCOPY WITH PROPOFOL;  Surgeon: Rogene Houston, MD;  [facility];  Service: Endoscopy;  Laterality: N/A;  7:30  . POLYPECTOMY  10/30/2016   Procedure: POLYPECTOMY;  Surgeon: Rogene Houston, MD;  Location: [facility];  Service: Endoscopy;;  colon        Home Medications    Prior to Admission medications   Medication Sig Start Date End Date Taking? Authorizing Provider  oxyCODONE (OXY IR/ROXICODONE) 5 MG immediate release tablet Take 5 mg by mouth 3 (three) times daily.   Yes [provider]  albuterol (PROVENTIL HFA;VENTOLIN HFA) 108 (90 Base) MCG/ACT inhaler Inhale 1 puff into the lungs every 6 (six) hours as needed for wheezing or shortness of breath.    [provider]    Family History Family History  Problem Relation Age  of Onset  . Cancer Mother     Social History Social History   Tobacco Use  . Smoking status: Current Every Day Smoker    Packs/day: 2.00    Years: 42.00    Pack years: 84.00    Types: Cigarettes  . Smokeless tobacco: Never Used  Substance Use Topics  . Alcohol use: Yes    Alcohol/week: 8.4 oz    Types: 14 Cans of beer per week    Comment: admits to drinking today-2 4o oz nightly  . Drug use: No     Allergies   Patient has no known allergies.   Review of Systems Review of Systems  All other systems reviewed and are negative.    Physical Exam Updated Vital Signs BP (!) 179/115 (BP Location: Left Arm)   Pulse 97   Temp 97.9 F (36.6 C) (Oral)   Resp 13   Ht 5\' 8"  (1.727 m)   Wt 54.4 kg (120 lb)   SpO2 100%   BMI 18.25 kg/m   Vital signs normal    Physical Exam  Constitutional: He is oriented to person, place, and time.  Non-toxic appearance. He does not appear ill. No distress.  Underweight male who looks older than his stated age, patient reeks of alcohol and smoke  HENT:  Head: Normocephalic.  Right  Ear: External ear normal.  Left Ear: External ear normal.  Nose: Nose normal. No mucosal edema or rhinorrhea.  Mouth/Throat: Oropharynx is clear and moist and mucous membranes are normal. No dental abscesses or uvula swelling.  Patient has a 3 centimeter laceration in his right forehead that is an irregular linear vertically placed wound.  There is no obvious debris in the wound.  It is not actively bleeding at this time.  Eyes: Pupils are equal, round, and reactive to light. Conjunctivae and EOM are normal.  Neck: Normal range of motion and full passive range of motion without pain. Neck supple.  Cardiovascular: Normal rate, regular rhythm and normal heart sounds. Exam reveals no gallop and no friction rub.  No murmur heard. Pulmonary/Chest: Effort normal and breath sounds normal. No respiratory distress. He has no wheezes. He has no rhonchi. He has no rales.  He exhibits no tenderness and no crepitus.  Abdominal: Soft. Normal appearance and bowel sounds are normal. He exhibits no distension. There is no tenderness. There is no rebound and no guarding.  Musculoskeletal: Normal range of motion. He exhibits no edema or tenderness.  Moves all extremities well.  Patient has some bulging over his right posterior shoulder that is painful.  He complains of pain in his right elbow when he moves it but there is no obvious swelling or deformity.  He denies pain in his wrist where he has a skin abrasions.  Neurological: He is alert and oriented to person, place, and time. He has normal strength. No cranial nerve deficit.  Skin: Skin is warm, dry and intact. No rash noted. No erythema. No pallor.  Patient has several areas of skin tears or skin loss on the dorsum of his left forearm and on his right forearm.  They are not actively bleeding.  Psychiatric: He has a normal mood and affect. His speech is normal and behavior is normal. His mood appears not anxious.  Nursing note and vitals reviewed.   Right Forehead    Left wrist    Right forearm     ED Treatments / Results  Labs (all labs ordered are listed, but only abnormal results are displayed) Labs Reviewed - No data to display  EKG None  Radiology Dg Clavicle Left  Result Date: 01/11/2018 CLINICAL DATA:  Fall. EXAM: LEFT CLAVICLE - 2+ VIEWS COMPARISON:  Chest x-ray dated February 05, 2017. FINDINGS: Acute fracture of the distal clavicle with inferior displacement. The distal fragment is rotated with respect to the remaining clavicle. The acromioclavicular joint is maintained. The glenohumeral joint is unremarkable. Old fracture deformity of the proximal humerus. Osteopenia. Soft tissue swelling around the fracture. IMPRESSION: Acute, displaced distal clavicle fracture. Electronically Signed   By: Titus Dubin M.D.   On: 01/11/2018 07:36   Dg Elbow Complete Left  Result Date: 01/11/2018 CLINICAL  DATA:  Status post fall out of bed, with left elbow pain. Initial encounter. EXAM: LEFT ELBOW - COMPLETE 3+ VIEW COMPARISON:  Left elbow radiographs performed 02/23/2008 FINDINGS: There is no evidence of fracture or dislocation. The visualized joint spaces are preserved. No significant joint effusion is identified. Mild capsular calcification is noted at the elbow. No additional soft tissue abnormalities are seen. IMPRESSION: No evidence of fracture or dislocation. Electronically Signed   By: Garald Balding M.D.   On: 01/11/2018 06:10   Ct Head Wo Contrast Ct Maxillofacial Wo Cm  Result Date: 01/11/2018 CLINICAL DATA:  62 y/o M; fall from bed with laceration of the right  eyebrow. EXAM: CT HEAD WITHOUT CONTRAST CT MAXILLOFACIAL WITHOUT CONTRAST TECHNIQUE: Multidetector CT imaging of the head and maxillofacial structures were performed using the standard protocol without intravenous contrast. Multiplanar CT image reconstructions of the maxillofacial structures were also generated. COMPARISON:  02/06/2017 CT head. FINDINGS: CT HEAD FINDINGS Brain: No evidence of acute infarction, hemorrhage, hydrocephalus, extra-axial collection or mass lesion/mass effect. Stable chronic microvascular ischemic changes and parenchymal volume loss of the brain. Vascular: Calcific atherosclerosis of carotid siphons. Skull: Normal. Negative for fracture or focal lesion. Other: None. CT MAXILLOFACIAL FINDINGS Osseous: No fracture or mandibular dislocation. No destructive process. Orbits: Negative. No traumatic or inflammatory finding. Sinuses: Partial opacification of right frontal, ethmoid, and maxillary sinuses with aerosolized secretions. Opacification of the right mastoid tip. Soft tissues: Soft tissue laceration and contusion involving the right superior medial periorbital soft tissues. IMPRESSION: 1. Laceration and contusion involving the right superior and medial periorbital soft tissues. 2. No traumatic or inflammatory finding  of the orbits. 3. No acute facial fracture or mandibular dislocation. 4. No acute calvarial fracture or intracranial abnormality. 5. Stable chronic microvascular ischemic changes and parenchymal volume loss of the brain. 6. Partial opacification of right frontal, ethmoid, and maxillary sinuses with aerosolized secretions which may represent acute sinusitis. Electronically Signed   By: Kristine Garbe M.D.   On: 01/11/2018 06:15   Dg Shoulder Left  Result Date: 01/11/2018 CLINICAL DATA:  Status post fall out of bed, with left shoulder pain. Initial encounter. EXAM: LEFT SHOULDER - 2+ VIEW COMPARISON:  Left humerus radiographs performed 09/20/2007 FINDINGS: There is no evidence of fracture or dislocation. The left humeral head is seated within the glenoid fossa. The acromioclavicular joint is not well characterized on provided images. No significant soft tissue abnormalities are seen. The visualized portions of the left lung are clear. IMPRESSION: No evidence of fracture or dislocation. Electronically Signed   By: Garald Balding M.D.   On: 01/11/2018 06:11       Procedures .Marland KitchenLaceration Repair Date/Time: 01/11/2018 7:05 AM Performed by: Rolland Porter, MD Authorized by: Rolland Porter, MD   Consent:    Consent obtained:  Verbal   Consent given by:  Patient   Risks discussed:  Poor cosmetic result   Alternatives discussed:  No treatment Anesthesia (see MAR for exact dosages):    Anesthesia method:  Local infiltration   Local anesthetic:  Lidocaine 2% WITH epi Laceration details:    Location:  Face   Face location:  Forehead   Length (cm):  3   Laceration depth: into the subcutaneous tissue. Repair type:    Repair type:  Intermediate Pre-procedure details:    Preparation:  Patient was prepped and draped in usual sterile fashion and imaging obtained to evaluate for foreign bodies Exploration:    Hemostasis achieved with:  Direct pressure   Wound exploration: entire depth of wound probed  and visualized     Wound extent: no foreign bodies/material noted, no muscle damage noted, no underlying fracture noted and no vascular damage noted     Contaminated: no   Treatment:    Area cleansed with:  Saline   Amount of cleaning:  Standard Subcutaneous repair:    Suture size:  5-0   Suture material:  Vicryl   Number of sutures:  3 Skin repair:    Repair method:  Sutures   Suture size:  6-0   Suture material:  Nylon   Number of sutures:  7 Approximation:    Approximation:  Close Post-procedure details:  Dressing:  Antibiotic ointment and non-adherent dressing   Patient tolerance of procedure:  Tolerated well, no immediate complications   (including critical care time)  Medications Ordered in ED Medications  lidocaine-EPINEPHrine (XYLOCAINE W/EPI) 2 %-1:200000 (PF) injection 10 mL (has no administration in time range)  Tdap (BOOSTRIX) injection 0.5 mL (0.5 mLs Intramuscular Given 01/11/18 0553)  acetaminophen (TYLENOL) tablet 650 mg (650 mg Oral Given 01/11/18 0727)     Initial Impression / Assessment and Plan / ED Course  I have reviewed the triage vital signs and the nursing notes.  Pertinent labs & imaging results that were available during my care of the patient were reviewed by me and considered in my medical decision making (see chart for details).   Patient is obviously intoxicated.  CT of the head and maxillofacial was done to look for fracture.  He complains of a lot of pain in his left shoulder and his left elbow and these were x-rayed.  Patient is requesting pain medication however he appears to be very intoxicated.  I wanted to look him up on the database first.   Patient's tetanus status was updated.  Patient was given Tylenol for his complaints of headache.  Patient does appear to have some type of bony deformity of his posterior left shoulder, the shoulder x-ray does not show anything.  When I palpate his clavicle I cannot tell if its related to the  clavicle or not, patient was sent back to x-ray to get a x-ray of his left clavicle.  Patient does indeed have a acute fracture of his clavicle.  He was placed in a sling.  Please note during his ED visit patient was using his left arm normally and even reached up with his left arm to try to reach the light that I needed to use for suturing.  He did not react badly when I was pressing on the fracture.  I suspect patient is very intoxicated.  He already has pain medications he just got filled this month.  Patient states "I told my doctor my bone was broken".  When I asked what happened he said he was having pain in that area however he states he did not feel the lump there until he fell out of bed today.  Please note the patient is moving that arm normally and using it to answer the phone and gets dressed without help.  I suspect this fracture is subacute.  Review of the [place] shows patient gets #90 oxycodone 5 mg tablets monthly, last filled May 2.  Before that he was getting #90 oxycodone 10 mg tablets monthly through March 4,  these are prescribed by his PCP.  Final Clinical Impressions(s) / ED Diagnoses   Final diagnoses:  Facial laceration, initial encounter  Skin tear of forearm without complication, left, sequela  Abrasion of left forearm, initial encounter  Contusion of left elbow, initial encounter  Closed displaced fracture of acromial end of left clavicle, initial encounter    ED Discharge Orders    None      Plan discharge  Rolland Porter, MD, Barbette Or, MD 01/11/18 [place], [place], MD 01/11/18 [phone]

## 2018-01-11 NOTE — Discharge Instructions (Addendum)
Keep the wounds clean and dry. Do use triple antibiotic ointment on the wounds to help prevent infection. Change the dressings daily. You have pain medication to take at home. You can take ibuprofen 400 mg 4 times a day for pain also. The sutures need to be removed in 5 days (Sunday the 19th). You need to call Dr Ruthe Mannan office, the orthopedist on call, to get an appointment to recheck your broken collar bone. Wear the sling for comfort. Return to the ED for any problems on the head injury sheet.

## 2018-01-11 NOTE — ED Triage Notes (Signed)
Pt states he fell out the bed while he was sleeping; pt states he had a couple of beers before he went to sleep; pt has laceration with minimal bleeding to right eyebrow; pt has multiple skin tears to bilateral arms; pt states he did lose consciousness but is unaware of how long

## 2018-01-11 NOTE — ED Notes (Signed)
Wound on both arms cleaned with saline, telfa dressing applied and wrapped with kerlex. Telfa dressing applied to wound on head and wrapped with kerlex. Pt tolerated well.

## 2018-02-01 ENCOUNTER — Ambulatory Visit: Payer: Medicare HMO | Admitting: Orthopaedic Surgery

## 2018-02-02 ENCOUNTER — Encounter: Payer: Self-pay | Admitting: Orthopaedic Surgery

## 2018-02-02 ENCOUNTER — Ambulatory Visit (INDEPENDENT_AMBULATORY_CARE_PROVIDER_SITE_OTHER): Payer: Medicare HMO | Admitting: Orthopaedic Surgery

## 2018-02-02 ENCOUNTER — Ambulatory Visit (INDEPENDENT_AMBULATORY_CARE_PROVIDER_SITE_OTHER): Payer: Medicare HMO

## 2018-02-02 VITALS — BP 159/99 | HR 123 | Ht 63.0 in | Wt 122.0 lb

## 2018-02-02 DIAGNOSIS — S42032A Displaced fracture of lateral end of left clavicle, initial encounter for closed fracture: Secondary | ICD-10-CM | POA: Diagnosis not present

## 2018-02-02 DIAGNOSIS — S42032K Displaced fracture of lateral end of left clavicle, subsequent encounter for fracture with nonunion: Secondary | ICD-10-CM | POA: Diagnosis not present

## 2018-02-02 NOTE — Patient Instructions (Signed)

## 2018-02-02 NOTE — Progress Notes (Signed)
new

## 2018-02-02 NOTE — Progress Notes (Signed)
Subjective:    Patient ID: Douglas Stout, male    DOB: March 06, 1956, 62 y.o.   MRN: [id number]  HPI He hurt his left clavicle about Christmas 2018 in a fall.  He saw Dr. Cindie Laroche about it.  No x-rays were done then.  On 01-11-18 he had x-rays of his chest showing a fracture healing of the left distal clavicle with some displacement.  He has no new injury.  He was told of the x-ray findings and made an appointment to see [place] here today.  He has some tenderness, no numbness, no redness and no new injury.   Review of Systems  Constitutional: Positive for activity change.  Respiratory: Negative for cough and shortness of breath.   Cardiovascular: Negative for chest pain and leg swelling.  Musculoskeletal: Positive for arthralgias.  All other systems reviewed and are negative.  Past Medical History:  Diagnosis Date  . Alcoholism /alcohol abuse ([place])   . Complication of anesthesia   . Dyspnea   . GERD (gastroesophageal reflux disease)     Past Surgical History:  Procedure Laterality Date  . BUNIONECTOMY Right   . COLONOSCOPY WITH PROPOFOL N/A 10/30/2016   Procedure: COLONOSCOPY WITH PROPOFOL;  Surgeon: Rogene Houston, MD;  [facility];  Service: Endoscopy;  Laterality: N/A;  7:30  . POLYPECTOMY  10/30/2016   Procedure: POLYPECTOMY;  Surgeon: Rogene Houston, MD;  [facility];  Service: Endoscopy;;  colon    Current Outpatient Medications on File Prior to Visit  Medication Sig Dispense Refill  . albuterol (PROVENTIL HFA;VENTOLIN HFA) 108 (90 Base) MCG/ACT inhaler Inhale 1 puff into the lungs every 6 (six) hours as needed for wheezing or shortness of breath.    . oxyCODONE (OXY IR/ROXICODONE) 5 MG immediate release tablet Take 5 mg by mouth 3 (three) times daily.     No current facility-administered medications on file prior to visit.     Social History   Socioeconomic History  . Marital status: Widowed    Spouse name: Not on file  . Number of children: Not on  file  . Years of education: Not on file  . Highest education level: Not on file  Occupational History  . Not on file  Social Needs  . Financial resource strain: Not on file  . Food insecurity:    Worry: Not on file    Inability: Not on file  . Transportation needs:    Medical: Not on file    Non-medical: Not on file  Tobacco Use  . Smoking status: Current Every Day Smoker    Packs/day: 2.00    Years: 42.00    Pack years: 84.00    Types: Cigarettes  . Smokeless tobacco: Never Used  Substance and Sexual Activity  . Alcohol use: Yes    Alcohol/week: 8.4 oz    Types: 14 Cans of beer per week    Comment: admits to drinking today-2 4o oz nightly  . Drug use: No  . Sexual activity: Never    Birth control/protection: None  Lifestyle  . Physical activity:    Days per week: Not on file    Minutes per session: Not on file  . Stress: Not on file  Relationships  . Social connections:    Talks on phone: Not on file    Gets together: Not on file    Attends religious service: Not on file    Active member of club or organization: Not on file  Attends meetings of clubs or organizations: Not on file    Relationship status: Not on file  . Intimate partner violence:    Fear of current or ex partner: Not on file    Emotionally abused: Not on file    Physically abused: Not on file    Forced sexual activity: Not on file  Other Topics Concern  . Not on file  Social History Narrative  . Not on file    Family History  Problem Relation Age of Onset  . Cancer Mother   . Cancer Brother     BP (!) 159/99   Pulse (!) 123   Ht 5\' 3"  (1.6 m)   Wt 122 lb (55.3 kg)   BMI 21.61 kg/m      Objective:   Physical Exam  Constitutional: He is oriented to person, place, and time. He appears well-developed and well-nourished.  HENT:  Head: Normocephalic and atraumatic.  Eyes: Pupils are equal, round, and reactive to light. Conjunctivae and EOM are normal.  Neck: Normal range of motion.  Neck supple.  Cardiovascular: Normal rate, regular rhythm and intact distal pulses.  Pulmonary/Chest: Effort normal.  Abdominal: Soft.  Musculoskeletal:       Left shoulder: He exhibits tenderness, bony tenderness and deformity.       Arms: Neurological: He is alert and oriented to person, place, and time. He has normal reflexes. He displays normal reflexes. No cranial nerve deficit. He exhibits normal muscle tone. Coordination normal.  Skin: Skin is warm and dry.  Psychiatric: He has a normal mood and affect. His behavior is normal. Judgment and thought content normal.    X-rays were done of the left clavicle, reported separately.      Assessment & Plan:   Encounter Diagnoses  Name Primary?  . Closed displaced fracture of acromial end of left clavicle, initial encounter Yes  . 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  He has full motion.  I would not recommend surgery.  He does not want surgery.  He just wanted to know what the problem is.  I will see him as needed.  Call if any problem.  Precautions discussed.   Electronically Signed Sanjuana Kava, MD 6/5/20192:43 PM

## 2018-03-06 ENCOUNTER — Other Ambulatory Visit: Payer: Self-pay

## 2018-03-06 ENCOUNTER — Emergency Department (HOSPITAL_COMMUNITY)
Admission: EM | Admit: 2018-03-06 | Discharge: 2018-03-06 | Disposition: A | Discharge status: Home / Self Care | Payer: Medicare HMO | Attending: Emergency Medicine | Admitting: Emergency Medicine

## 2018-03-06 ENCOUNTER — Emergency Department (HOSPITAL_COMMUNITY): Payer: Medicare HMO

## 2018-03-06 ENCOUNTER — Encounter (HOSPITAL_COMMUNITY): Payer: Self-pay | Admitting: Emergency Medicine

## 2018-03-06 DIAGNOSIS — F17210 Nicotine dependence, cigarettes, uncomplicated: Secondary | ICD-10-CM | POA: Diagnosis not present

## 2018-03-06 DIAGNOSIS — R0789 Other chest pain: Secondary | ICD-10-CM | POA: Insufficient documentation

## 2018-03-06 MED ORDER — SODIUM CHLORIDE 0.9 % IV BOLUS: 1000.0000 mL | Freq: Once | INTRAVENOUS | Status: DC

## 2018-03-06 MED ORDER — MORPHINE SULFATE (PF) 2 MG/ML IV SOLN: 2.0000 mg | INTRAVENOUS | Status: DC | PRN

## 2018-03-06 NOTE — ED Triage Notes (Signed)
Pt arrives via [facility]. Pt C/O CP that started around 2hr ago. Pt given 1 nitro on EMS with no relief.

## 2018-03-06 NOTE — ED Notes (Signed)
Pt seen leaving room, Beth Rn asked pt where he was going, pt states "home" Eustaquio Maize Rn advised pt to return to room so that she could take the iv out, pt pulled iv out of left wrist and continued to walk out of the department, refusing to allow staff to speak to him,

## 2018-03-06 NOTE — ED Provider Notes (Signed)
[facility] EMERGENCY DEPARTMENT Provider Note   CSN: [id number] Arrival date & time: 03/06/18  0225     History   Chief Complaint Chief Complaint  Patient presents with  . Chest Pain    HPI Douglas Stout is a 62 y.o. male.  HPI  Pt was seen at 0240. Per EMS and pt report: Pt c/o gradual onset and persistence of constant left upper chest wall "pain" that began approximately midnight. Pt states the pain worsens when he touches the area. EMS gave pt SL ntg without change. Pt denies radiation of pain. Denies palpitations, no injury, no SOB/cough, no rash, no fevers, no abd pain, no N/V/D.    Past Medical History:  Diagnosis Date  . Alcoholism /alcohol abuse ([place])   . Complication of anesthesia   . Dyspnea   . GERD (gastroesophageal reflux disease)     Patient Active Problem List   Diagnosis Date Noted  . Special screening for malignant neoplasms, colon 10/07/2016    Past Surgical History:  Procedure Laterality Date  . BUNIONECTOMY Right   . COLONOSCOPY WITH PROPOFOL N/A 10/30/2016   Procedure: COLONOSCOPY WITH PROPOFOL;  Surgeon: Rogene Houston, MD;  Location: [facility];  Service: Endoscopy;  Laterality: N/A;  7:30  . POLYPECTOMY  10/30/2016   Procedure: POLYPECTOMY;  Surgeon: Rogene Houston, MD;  Location: [facility];  Service: Endoscopy;;  colon        Home Medications    Prior to Admission medications   Medication Sig Start Date End Date Taking? Authorizing Provider  albuterol (PROVENTIL HFA;VENTOLIN HFA) 108 (90 Base) MCG/ACT inhaler Inhale 1 puff into the lungs every 6 (six) hours as needed for wheezing or shortness of breath.    [provider]  oxyCODONE (OXY IR/ROXICODONE) 5 MG immediate release tablet Take 5 mg by mouth 3 (three) times daily.    [provider]    Family History Family History  Problem Relation Age of Onset  . Cancer Mother   . Cancer Brother     Social History Social History   Tobacco Use  . Smoking  status: Current Every Day Smoker    Packs/day: 2.00    Years: 42.00    Pack years: 84.00    Types: Cigarettes  . Smokeless tobacco: Never Used  Substance Use Topics  . Alcohol use: Yes    Alcohol/week: 8.4 oz    Types: 14 Cans of beer per week    Comment: admits to drinking today-2 4o oz nightly  . Drug use: No     Allergies   Patient has no known allergies.   Review of Systems Review of Systems ROS: Statement: All systems negative except as marked or noted in the HPI; Constitutional: Negative for fever and chills. ; ; Eyes: Negative for eye pain, redness and discharge. ; ; ENMT: Negative for ear pain, hoarseness, nasal congestion, sinus pressure and sore throat. ; ; Cardiovascular: Negative for palpitations, diaphoresis, dyspnea and peripheral edema. ; ; Respiratory: Negative for cough, wheezing and stridor. ; ; Gastrointestinal: Negative for nausea, vomiting, diarrhea, abdominal pain, blood in stool, hematemesis, jaundice and rectal bleeding. . ; ; Genitourinary: Negative for dysuria, flank pain and hematuria. ; ; Musculoskeletal: +chest wall pain. Negative for back pain and neck pain. Negative for swelling and trauma.; ; Skin: Negative for pruritus, rash, abrasions, blisters, bruising and skin lesion.; ; Neuro: Negative for headache, lightheadedness and neck stiffness. Negative for weakness, altered level of consciousness, altered mental status, extremity  weakness, paresthesias, involuntary movement, seizure and syncope.       Physical Exam Updated Vital Signs BP (!) 121/91 (BP Location: Left Arm)   Pulse (!) 101   Temp 97.9 F (36.6 C) (Oral)   Resp 17   Wt 55.3 kg (122 lb)   SpO2 99%   BMI 21.61 kg/m   Physical Exam 0245: Physical examination:  Nursing notes reviewed; Vital signs and O2 SAT reviewed;  Constitutional: Well developed, Well nourished, Well hydrated, In no acute distress; Head:  Normocephalic, atraumatic; Eyes: EOMI, PERRL, No scleral icterus; ENMT: Mouth and  pharynx normal, Mucous membranes moist; Neck: Supple, Full range of motion, No lymphadenopathy; Cardiovascular: Regular rate and rhythm, No gallop; Respiratory: Breath sounds clear & equal bilaterally, No wheezes.  Speaking full sentences with ease, Normal respiratory effort/excursion; Chest: +left upper lateral chest wall point tender to palp. No rash, no soft tissue crepitus, no deformity. Movement normal; Abdomen: Soft, Nontender, Nondistended, Normal bowel sounds; Genitourinary: No CVA tenderness; Extremities: Peripheral pulses normal, No tenderness, No edema, No calf edema or asymmetry.; Neuro: AA&Ox3, Major CN grossly intact.  Speech clear. No gross focal motor or sensory deficits in extremities.; Skin: Color normal, Warm, Dry.   ED Treatments / Results  Labs (all labs ordered are listed, but only abnormal results are displayed)   EKG None  Radiology   Procedures Procedures (including critical care time)  Medications Ordered in ED Medications - No data to display   Initial Impression / Assessment and Plan / ED Course  I have reviewed the triage vital signs and the nursing notes.  Pertinent labs & imaging results that were available during my care of the patient were reviewed by me and considered in my medical decision making (see chart for details).  MDM Reviewed: previous chart, nursing note and vitals    0250:  After my exam, pt asked me if he had "muscle pain." I told pt he may, but that I would need to run further testing to r/o cardiac or pulmonary source of his pain. Pt stated he "thought it was just muscle pain" and started to remove his EKG leads. As I left the exam room, pt then was seen to walk out of his exam room and the ED without talking further with staff.     Final Clinical Impressions(s) / ED Diagnoses   Final diagnoses:  Chest wall pain    ED Discharge Orders    None       Francine Graven, DO 03/09/18 1514

## 2018-04-28 ENCOUNTER — Other Ambulatory Visit (HOSPITAL_COMMUNITY): Payer: Self-pay | Admitting: Family Medicine

## 2018-04-28 ENCOUNTER — Ambulatory Visit (HOSPITAL_COMMUNITY)
Admission: RE | Admit: 2018-04-28 | Discharge: 2018-04-28 | Disposition: A | Discharge status: Home / Self Care | Payer: Medicare HMO | Source: Ambulatory Visit | Attending: Family Medicine | Admitting: Family Medicine

## 2018-04-28 DIAGNOSIS — R609 Edema, unspecified: Secondary | ICD-10-CM | POA: Diagnosis present

## 2018-04-28 DIAGNOSIS — M11262 Other chondrocalcinosis, left knee: Secondary | ICD-10-CM | POA: Diagnosis not present

## 2018-04-28 DIAGNOSIS — M1712 Unilateral primary osteoarthritis, left knee: Secondary | ICD-10-CM | POA: Diagnosis not present

## 2018-04-28 DIAGNOSIS — M159 Polyosteoarthritis, unspecified: Secondary | ICD-10-CM

## 2018-04-28 DIAGNOSIS — G8929 Other chronic pain: Secondary | ICD-10-CM | POA: Insufficient documentation

## 2018-06-14 ENCOUNTER — Ambulatory Visit (INDEPENDENT_AMBULATORY_CARE_PROVIDER_SITE_OTHER): Payer: Medicare HMO | Admitting: Orthopaedic Surgery

## 2018-06-14 ENCOUNTER — Encounter: Payer: Self-pay | Admitting: Orthopaedic Surgery

## 2018-06-14 VITALS — BP 147/93 | HR 111 | Ht 63.0 in | Wt 130.0 lb

## 2018-06-14 DIAGNOSIS — M25562 Pain in left knee: Secondary | ICD-10-CM | POA: Diagnosis not present

## 2018-06-14 DIAGNOSIS — G8929 Other chronic pain: Secondary | ICD-10-CM

## 2018-06-14 NOTE — Patient Instructions (Signed)
Steps to Quit Smoking Smoking tobacco can be bad for your health. It can also affect almost every organ in your body. Smoking puts you and people around you at risk for many serious long-lasting (chronic) diseases. Quitting smoking is hard, but it is one of the best things that you can do for your health. It is never too late to quit. What are the benefits of quitting smoking? When you quit smoking, you lower your risk for getting serious diseases and conditions. They can include:  Lung cancer or lung disease.  Heart disease.  Stroke.  Heart attack.  Not being able to have children (infertility).  Weak bones (osteoporosis) and broken bones (fractures).  If you have coughing, wheezing, and shortness of breath, those symptoms may get better when you quit. You may also get sick less often. If you are pregnant, quitting smoking can help to lower your chances of having a baby of low birth weight. What can I do to help me quit smoking? Talk with your doctor about what can help you quit smoking. Some things you can do (strategies) include:  Quitting smoking totally, instead of slowly cutting back how much you smoke over a period of time.  Going to in-person counseling. You are more likely to quit if you go to many counseling sessions.  Using resources and support systems, such as: ? Online chats with a counselor. ? Phone quitlines. ? Printed self-help materials. ? Support groups or group counseling. ? Text messaging programs. ? Mobile phone apps or applications.  Taking medicines. Some of these medicines may have nicotine in them. If you are pregnant or breastfeeding, do not take any medicines to quit smoking unless your doctor says it is okay. Talk with your doctor about counseling or other things that can help you.  Talk with your doctor about using more than one strategy at the same time, such as taking medicines while you are also going to in-person counseling. This can help make  quitting easier. What things can I do to make it easier to quit? Quitting smoking might feel very hard at first, but there is a lot that you can do to make it easier. Take these steps:  Talk to your family and friends. Ask them to support and encourage you.  Call phone quitlines, reach out to support groups, or work with a counselor.  Ask people who smoke to not smoke around you.  Avoid places that make you want (trigger) to smoke, such as: ? Bars. ? Parties. ? Smoke-break areas at work.  Spend time with people who do not smoke.  Lower the stress in your life. Stress can make you want to smoke. Try these things to help your stress: ? Getting regular exercise. ? Deep-breathing exercises. ? Yoga. ? Meditating. ? Doing a body scan. To do this, close your eyes, focus on one area of your body at a time from head to toe, and notice which parts of your body are tense. Try to relax the muscles in those areas.  Download or buy apps on your mobile phone or tablet that can help you stick to your quit plan. There are many free apps, such as QuitGuide from the CDC (Centers for Disease Control and Prevention). You can find more support from smokefree.gov and other websites.  This information is not intended to replace advice given to you by your health care provider. Make sure you discuss any questions you have with your health care provider. Document Released: 06/13/2009 Document   Revised: 04/14/2016 Document Reviewed: 01/01/2015 Elsevier Interactive Patient Education  2018 Elsevier Inc.  

## 2018-06-14 NOTE — Progress Notes (Signed)
CC:  My knee hurts  He has had knee pain on the left for some time.  He had x-rays done in August.  I have notes from Dr. Cindie Laroche and have reviewed them.    He was anxious to leave.  He said he did not want an injection, did not want arthritis medicine.  He said he was on "Roxi" 5 and thought he needed a stronger dose for his back pain.  He has crepitus of the left knee, no effusion, ROM 0 to 105 with some pain.  He has a cane and a limp to the left.  NV intact.  He got up and left after I was mentioning treatment options.  He said he had things to do and could not wait and had to leave.  I rendered no treatment.    I will see as needed.  Electronically Signed Sanjuana Kava, MD 10/15/20199:11 AM

## 2018-08-29 ENCOUNTER — Emergency Department (HOSPITAL_COMMUNITY)
Admission: EM | Admit: 2018-08-29 | Discharge: 2018-08-29 | Disposition: A | Discharge status: Home / Self Care | Payer: Medicare HMO | Attending: Emergency Medicine | Admitting: Emergency Medicine

## 2018-08-29 ENCOUNTER — Encounter (HOSPITAL_COMMUNITY): Payer: Self-pay

## 2018-08-29 ENCOUNTER — Other Ambulatory Visit: Payer: Self-pay

## 2018-08-29 ENCOUNTER — Emergency Department (HOSPITAL_COMMUNITY): Payer: Medicare HMO

## 2018-08-29 DIAGNOSIS — R0789 Other chest pain: Secondary | ICD-10-CM | POA: Insufficient documentation

## 2018-08-29 DIAGNOSIS — F17220 Nicotine dependence, chewing tobacco, uncomplicated: Secondary | ICD-10-CM | POA: Diagnosis not present

## 2018-08-29 DIAGNOSIS — Z79899 Other long term (current) drug therapy: Secondary | ICD-10-CM | POA: Diagnosis not present

## 2018-08-29 DIAGNOSIS — R1010 Upper abdominal pain, unspecified: Secondary | ICD-10-CM | POA: Diagnosis present

## 2018-08-29 DIAGNOSIS — E876 Hypokalemia: Secondary | ICD-10-CM | POA: Insufficient documentation

## 2018-08-29 DIAGNOSIS — R1013 Epigastric pain: Secondary | ICD-10-CM | POA: Insufficient documentation

## 2018-08-29 DIAGNOSIS — F17210 Nicotine dependence, cigarettes, uncomplicated: Secondary | ICD-10-CM | POA: Insufficient documentation

## 2018-08-29 DIAGNOSIS — E860 Dehydration: Secondary | ICD-10-CM | POA: Insufficient documentation

## 2018-08-29 LAB — CBC WITH DIFFERENTIAL/PLATELET
Abs Immature Granulocytes: 0.02 10*3/uL (ref 0.00–0.07)
BASOS ABS: 0.1 10*3/uL (ref 0.0–0.1)
Basophils Relative: 1 %
EOS PCT: 1 %
Eosinophils Absolute: 0 10*3/uL (ref 0.0–0.5)
HEMATOCRIT: 39.1 % (ref 39.0–52.0)
Hemoglobin: 13.4 g/dL (ref 13.0–17.0)
Immature Granulocytes: 0 %
LYMPHS ABS: 1.6 10*3/uL (ref 0.7–4.0)
Lymphocytes Relative: 26 %
MCH: 32.8 pg (ref 26.0–34.0)
MCHC: 34.3 g/dL (ref 30.0–36.0)
MCV: 95.8 fL (ref 80.0–100.0)
MONO ABS: 0.9 10*3/uL (ref 0.1–1.0)
MONOS PCT: 14 %
Neutro Abs: 3.6 10*3/uL (ref 1.7–7.7)
Neutrophils Relative %: 58 %
Platelets: 215 10*3/uL (ref 150–400)
RBC: 4.08 MIL/uL — ABNORMAL LOW (ref 4.22–5.81)
RDW: 15.2 % (ref 11.5–15.5)
WBC: 6.1 10*3/uL (ref 4.0–10.5)
nRBC: 0 % (ref 0.0–0.2)

## 2018-08-29 LAB — COMPREHENSIVE METABOLIC PANEL
ALBUMIN: 2.9 g/dL — AB (ref 3.5–5.0)
ALK PHOS: 82 U/L (ref 38–126)
ALT: 18 U/L (ref 0–44)
ANION GAP: 12 (ref 5–15)
AST: 53 U/L — ABNORMAL HIGH (ref 15–41)
BILIRUBIN TOTAL: 0.5 mg/dL (ref 0.3–1.2)
BUN: 5 mg/dL — ABNORMAL LOW (ref 8–23)
CALCIUM: 8.3 mg/dL — AB (ref 8.9–10.3)
CO2: 16 mmol/L — ABNORMAL LOW (ref 22–32)
Chloride: 112 mmol/L — ABNORMAL HIGH (ref 98–111)
Creatinine, Ser: 0.85 mg/dL (ref 0.61–1.24)
GFR calc Af Amer: >60 mL/min (ref >60)
GLUCOSE: 93 mg/dL (ref 70–99)
Potassium: 2.7 mmol/L — CL (ref 3.5–5.1)
Sodium: 140 mmol/L (ref 135–145)
TOTAL PROTEIN: 7.5 g/dL (ref 6.5–8.1)

## 2018-08-29 LAB — TROPONIN I: Troponin I: <0.03 ng/mL (ref <0.03)

## 2018-08-29 LAB — LIPASE, BLOOD: Lipase: 33 U/L (ref 11–51)

## 2018-08-29 MED ORDER — POTASSIUM CHLORIDE CRYS ER 20 MEQ PO TBCR: 20.0000 meq | EXTENDED_RELEASE_TABLET | Freq: Every day | ORAL | 0 refills | Status: AC

## 2018-08-29 MED ORDER — LIDOCAINE VISCOUS HCL 2 % MT SOLN
15.0000 mL | Freq: Once | OROMUCOSAL | Status: AC
  Administered 2018-08-29: 15 mL via ORAL
  Filled 2018-08-29: qty 15

## 2018-08-29 MED ORDER — ALUM & MAG HYDROXIDE-SIMETH 200-200-20 MG/5ML PO SUSP
30.0000 mL | Freq: Once | ORAL | Status: AC
  Administered 2018-08-29: 30 mL via ORAL
  Filled 2018-08-29: qty 30

## 2018-08-29 MED ORDER — SODIUM CHLORIDE 0.9 % IV BOLUS
500.0000 mL | Freq: Once | INTRAVENOUS | Status: AC
  Administered 2018-08-29: 500 mL via INTRAVENOUS

## 2018-08-29 MED ORDER — POTASSIUM CHLORIDE 10 MEQ/100ML IV SOLN
10.0000 meq | INTRAVENOUS | Status: AC
  Administered 2018-08-29 (×2): 10 meq via INTRAVENOUS
  Filled 2018-08-29 (×2): qty 100

## 2018-08-29 MED ORDER — ALUM & MAG HYDROXIDE-SIMETH 200-200-20 MG/5ML PO SUSP
30.0000 mL | Freq: Once | ORAL | Status: DC
Start: 2018-08-29 — End: 2018-08-29

## 2018-08-29 MED ORDER — SUCRALFATE 1 G PO TABS: 1.0000 g | ORAL_TABLET | Freq: Three times a day (TID) | ORAL | 0 refills | Status: AC

## 2018-08-29 MED ORDER — POTASSIUM CHLORIDE CRYS ER 20 MEQ PO TBCR
40.0000 meq | EXTENDED_RELEASE_TABLET | Freq: Once | ORAL | Status: AC
  Administered 2018-08-29: 40 meq via ORAL
  Filled 2018-08-29: qty 2

## 2018-08-29 NOTE — ED Notes (Signed)
Pt states he needs to talk to MD. MD notified.

## 2018-08-29 NOTE — ED Notes (Signed)
CRITICAL VALUE ALERT  Critical Value:  Potassium 2.7  Date & Time Notied:  08/29/18, 1354  Provider Notified: Dr. Lita Mains  Orders Received/Actions taken: no new orders at this time

## 2018-08-29 NOTE — ED Provider Notes (Signed)
[facility] EMERGENCY DEPARTMENT Provider Note   CSN: [id number] Arrival date & time: 08/29/18  1103     History   Chief Complaint Chief Complaint  Patient presents with  . Fall    HPI Douglas Stout is a 62 y.o. male.  HPI Patient presents with upper abdominal pain radiating into his chest that began earlier this morning.  States he was walking to the hospital and lost his balance and fell.  Denies hitting his head.  No loss of consciousness.  Complains of generalized weakness.  States he continues to have the pain in his chest and has had mild cough.  No new lower extremity swelling or pain.  Patient has chronic left knee pain which is unchanged.  No constipation or diarrhea.  No grossly bloody or melanotic stool. Past Medical History:  Diagnosis Date  . Alcoholism /alcohol abuse ([place])   . Complication of anesthesia   . Dyspnea   . GERD (gastroesophageal reflux disease)     Patient Active Problem List   Diagnosis Date Noted  . Special screening for malignant neoplasms, colon 10/07/2016    Past Surgical History:  Procedure Laterality Date  . BUNIONECTOMY Right   . COLONOSCOPY WITH PROPOFOL N/A 10/30/2016   Procedure: COLONOSCOPY WITH PROPOFOL;  Surgeon: Rogene Houston, MD;  Location: [facility];  Service: Endoscopy;  Laterality: N/A;  7:30  . POLYPECTOMY  10/30/2016   Procedure: POLYPECTOMY;  Surgeon: Rogene Houston, MD;  Location: [facility];  Service: Endoscopy;;  colon        Home Medications    Prior to Admission medications   Medication Sig Start Date End Date Taking? Authorizing Provider  albuterol (PROVENTIL HFA;VENTOLIN HFA) 108 (90 Base) MCG/ACT inhaler Inhale 1 puff into the lungs every 6 (six) hours as needed for wheezing or shortness of breath.   Yes [provider]  oxyCODONE (OXY IR/ROXICODONE) 5 MG immediate release tablet Take 5 mg by mouth 3 (three) times daily.   Yes [provider]  pantoprazole (PROTONIX) 40 MG tablet Take  40 mg by mouth daily.   Yes [provider]  potassium chloride SA (K-DUR,KLOR-CON) 20 MEQ tablet Take 1 tablet (20 mEq total) by mouth daily. 08/29/18   Julianne Rice, MD  sucralfate (CARAFATE) 1 g tablet Take 1 tablet (1 g total) by mouth 4 (four) times daily -  with meals and at bedtime. 08/29/18   Julianne Rice, MD    Family History Family History  Problem Relation Age of Onset  . Cancer Mother   . Cancer Brother     Social History Social History   Tobacco Use  . Smoking status: Current Every Day Smoker    Packs/day: 2.00    Years: 42.00    Pack years: 84.00    Types: Cigarettes  . Smokeless tobacco: Current User    Types: Chew  Substance Use Topics  . Alcohol use: Yes    Comment: daily  . Drug use: No     Allergies   Patient has no known allergies.   Review of Systems Review of Systems  Constitutional: Negative for chills and fever.  HENT: Negative for facial swelling, sore throat and trouble swallowing.   Eyes: Positive for visual disturbance. Negative for photophobia and pain.  Respiratory: Positive for cough and shortness of breath.   Cardiovascular: Positive for chest pain. Negative for palpitations.  Gastrointestinal: Positive for abdominal pain. Negative for blood in stool, constipation, diarrhea, nausea and vomiting.  Genitourinary:  Negative for dysuria, flank pain, frequency and hematuria.  Musculoskeletal: Positive for arthralgias and back pain. Negative for neck pain and neck stiffness.  Skin: Negative for rash and wound.  Neurological: Positive for weakness. Negative for dizziness, syncope, light-headedness, numbness and headaches.  All other systems reviewed and are negative.    Physical Exam Updated Vital Signs BP 138/83   Pulse 86   Temp 97.7 F (36.5 C) (Oral)   Resp 19   Ht 5\' 8"  (1.727 m)   Wt 56.7 kg   SpO2 100%   BMI 19.01 kg/m   Physical Exam Vitals signs and nursing note reviewed.  Constitutional:      General:  He is not in acute distress.    Appearance: Normal appearance. He is well-developed. He is not ill-appearing.  HENT:     Head: Normocephalic and atraumatic.     Comments: No obvious head trauma.  No intraoral trauma.  Midface is stable.  No malocclusion.    Nose: Nose normal.     Mouth/Throat:     Mouth: Mucous membranes are moist.     Pharynx: No oropharyngeal exudate or posterior oropharyngeal erythema.  Eyes:     Extraocular Movements: Extraocular movements intact.     Conjunctiva/sclera: Conjunctivae normal.     Pupils: Pupils are equal, round, and reactive to light.  Neck:     Musculoskeletal: Normal range of motion and neck supple.     Comments: No posterior midline cervical tenderness to palpation. Cardiovascular:     Rate and Rhythm: Normal rate and regular rhythm.     Pulses: Normal pulses.     Heart sounds: No murmur. No friction rub. No gallop.   Pulmonary:     Effort: Pulmonary effort is normal. No respiratory distress.     Breath sounds: Normal breath sounds. No stridor. No wheezing, rhonchi or rales.  Chest:     Chest wall: No tenderness.  Abdominal:     General: Bowel sounds are normal.     Palpations: Abdomen is soft.     Tenderness: There is abdominal tenderness. There is no guarding or rebound.     Comments: Epigastric tenderness to palpation.  Musculoskeletal: Normal range of motion.        General: No tenderness.     Comments: Mild diffuse thoracic and lumbar tenderness.  No step-offs or deformity.  Pelvis is stable.  Distal pulses 2+.  Skin:    General: Skin is warm and dry.     Findings: No erythema or rash.  Neurological:     General: No focal deficit present.     Mental Status: He is alert and oriented to person, place, and time.     Comments: 5/5 motor in all extremities.  Sensation fully intact.  Psychiatric:        Mood and Affect: Mood normal.        Behavior: Behavior normal.      ED Treatments / Results  Labs (all labs ordered are listed,  but only abnormal results are displayed) Labs Reviewed  CBC WITH DIFFERENTIAL/PLATELET - Abnormal; Notable for the following components:      Result Value   RBC 4.08 (*)    All other components within normal limits  COMPREHENSIVE METABOLIC PANEL - Abnormal; Notable for the following components:   Potassium 2.7 (*)    Chloride 112 (*)    CO2 16 (*)    BUN 5 (*)    Calcium 8.3 (*)    Albumin 2.9 (*)  AST 53 (*)    All other components within normal limits  LIPASE, BLOOD  TROPONIN I  TROPONIN I    EKG EKG Interpretation  Date/Time:  Monday August 29 2018 11:37:25 EST Ventricular Rate:  91 PR Interval:    QRS Duration: 94 QT Interval:  361 QTC Calculation: 445 R Axis:   69 Text Interpretation:  Sinus rhythm Borderline low voltage, extremity leads Confirmed by Julianne Rice [phone]) on 08/29/2018 12:43:13 PM   Radiology Dg Chest 2 View  Result Date: 08/29/2018 CLINICAL DATA:  Chest pain radiating toward RIGHT chest. Shortness of breath. Smoker. EXAM: CHEST - 2 VIEW COMPARISON:  02/05/2017. FINDINGS: The heart size and mediastinal contours are within normal limits. Both lungs are clear. The visualized skeletal structures are unremarkable. IMPRESSION: No active cardiopulmonary disease. Since stable appearance from priors. Electronically Signed   By: Staci Righter M.D.   On: 08/29/2018 12:49    Procedures Procedures (including critical care time)  Medications Ordered in ED Medications  sodium chloride 0.9 % bolus 500 mL (0 mLs Intravenous Stopped 08/29/18 1404)  alum & mag hydroxide-simeth (MAALOX/MYLANTA) 200-200-20 MG/5ML suspension 30 mL (30 mLs Oral Given 08/29/18 1253)    And  lidocaine (XYLOCAINE) 2 % viscous mouth solution 15 mL (15 mLs Oral Given 08/29/18 1253)  potassium chloride SA (K-DUR,KLOR-CON) CR tablet 40 mEq (40 mEq Oral Given 08/29/18 1429)  potassium chloride 10 mEq in 100 mL IVPB ( Intravenous Stopped 08/29/18 1716)  sodium chloride 0.9 % bolus 500  mL (0 mLs Intravenous Stopped 08/29/18 1530)     Initial Impression / Assessment and Plan / ED Course  I have reviewed the triage vital signs and the nursing notes.  Pertinent labs & imaging results that were available during my care of the patient were reviewed by me and considered in my medical decision making (see chart for details).    Troponin x2 is normal.  EKG without ischemic findings.  Patient does have hypokalemia.  Given IV and oral potassium replacement.  Will need to have this rechecked by his primary physician as an outpatient.  Also concern for dehydration.  Given several boluses of IV fluids.  Tachycardia has improved.  Patient's upper abdominal pain likely due to gastritis.  Will place on Carafate.  Advised to continue Protonix.  If symptoms or not improving will need to follow-up with gastroenterology.  Strict return precautions given   Final Clinical Impressions(s) / ED Diagnoses   Final diagnoses:  Hypokalemia  Epigastric pain  Atypical chest pain  Dehydration    ED Discharge Orders         Ordered    potassium chloride SA (K-DUR,KLOR-CON) 20 MEQ tablet  Daily     08/29/18 1719    sucralfate (CARAFATE) 1 g tablet  3 times daily with meals & bedtime     08/29/18 1719           Julianne Rice, MD 08/29/18 1721

## 2018-08-29 NOTE — ED Triage Notes (Signed)
Pt reports generalized pain, seeing black spots in vision, and generalized weakness.  Pt says he fell 4 times this morning walking to hospital

## 2018-10-12 ENCOUNTER — Other Ambulatory Visit (HOSPITAL_COMMUNITY): Payer: Self-pay | Admitting: Family Medicine

## 2018-10-12 DIAGNOSIS — R1084 Generalized abdominal pain: Secondary | ICD-10-CM

## 2018-10-17 ENCOUNTER — Ambulatory Visit (HOSPITAL_COMMUNITY)
Admission: RE | Admit: 2018-10-17 | Discharge: 2018-10-17 | Disposition: A | Discharge status: Home / Self Care | Payer: Medicare Other | Source: Ambulatory Visit | Attending: Family Medicine | Admitting: Family Medicine

## 2018-10-17 DIAGNOSIS — R1084 Generalized abdominal pain: Secondary | ICD-10-CM | POA: Diagnosis present

## 2018-10-17 LAB — POCT I-STAT CREATININE: Creatinine, Ser: 0.7 mg/dL (ref 0.61–1.24)

## 2018-10-17 MED ORDER — IOHEXOL 300 MG/ML  SOLN
100.0000 mL | Freq: Once | INTRAMUSCULAR | Status: AC | PRN
  Administered 2018-10-17: 100 mL via INTRAVENOUS

## 2018-12-26 IMAGING — DX DG CHEST 2V
2 series · 2 of 2 positions shown · non-contrast
Comparison: 12/10/2015

CLINICAL DATA: Shortness of breath with central chest pain

EXAM:
CHEST  2 VIEW

[chest lat]
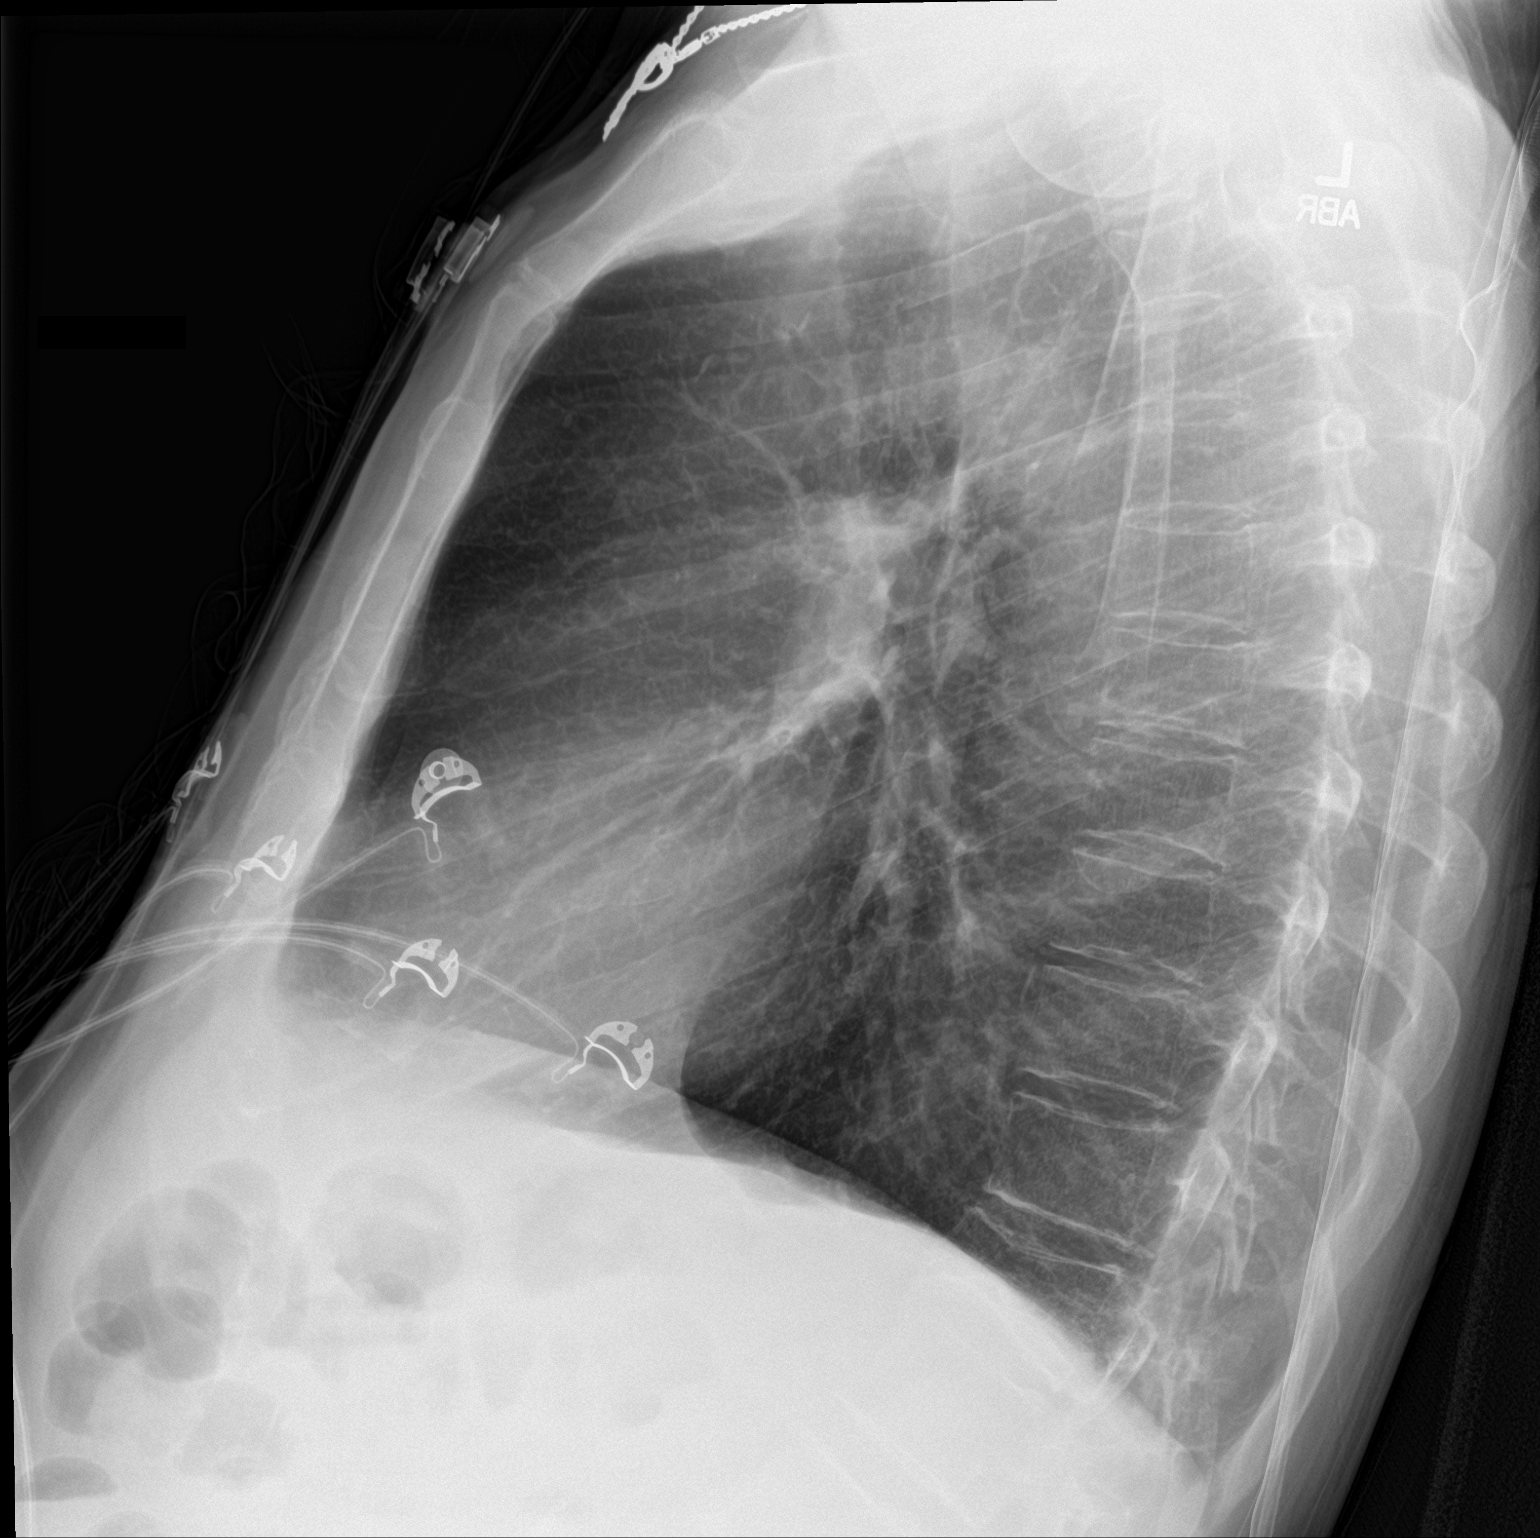

[chest ap]
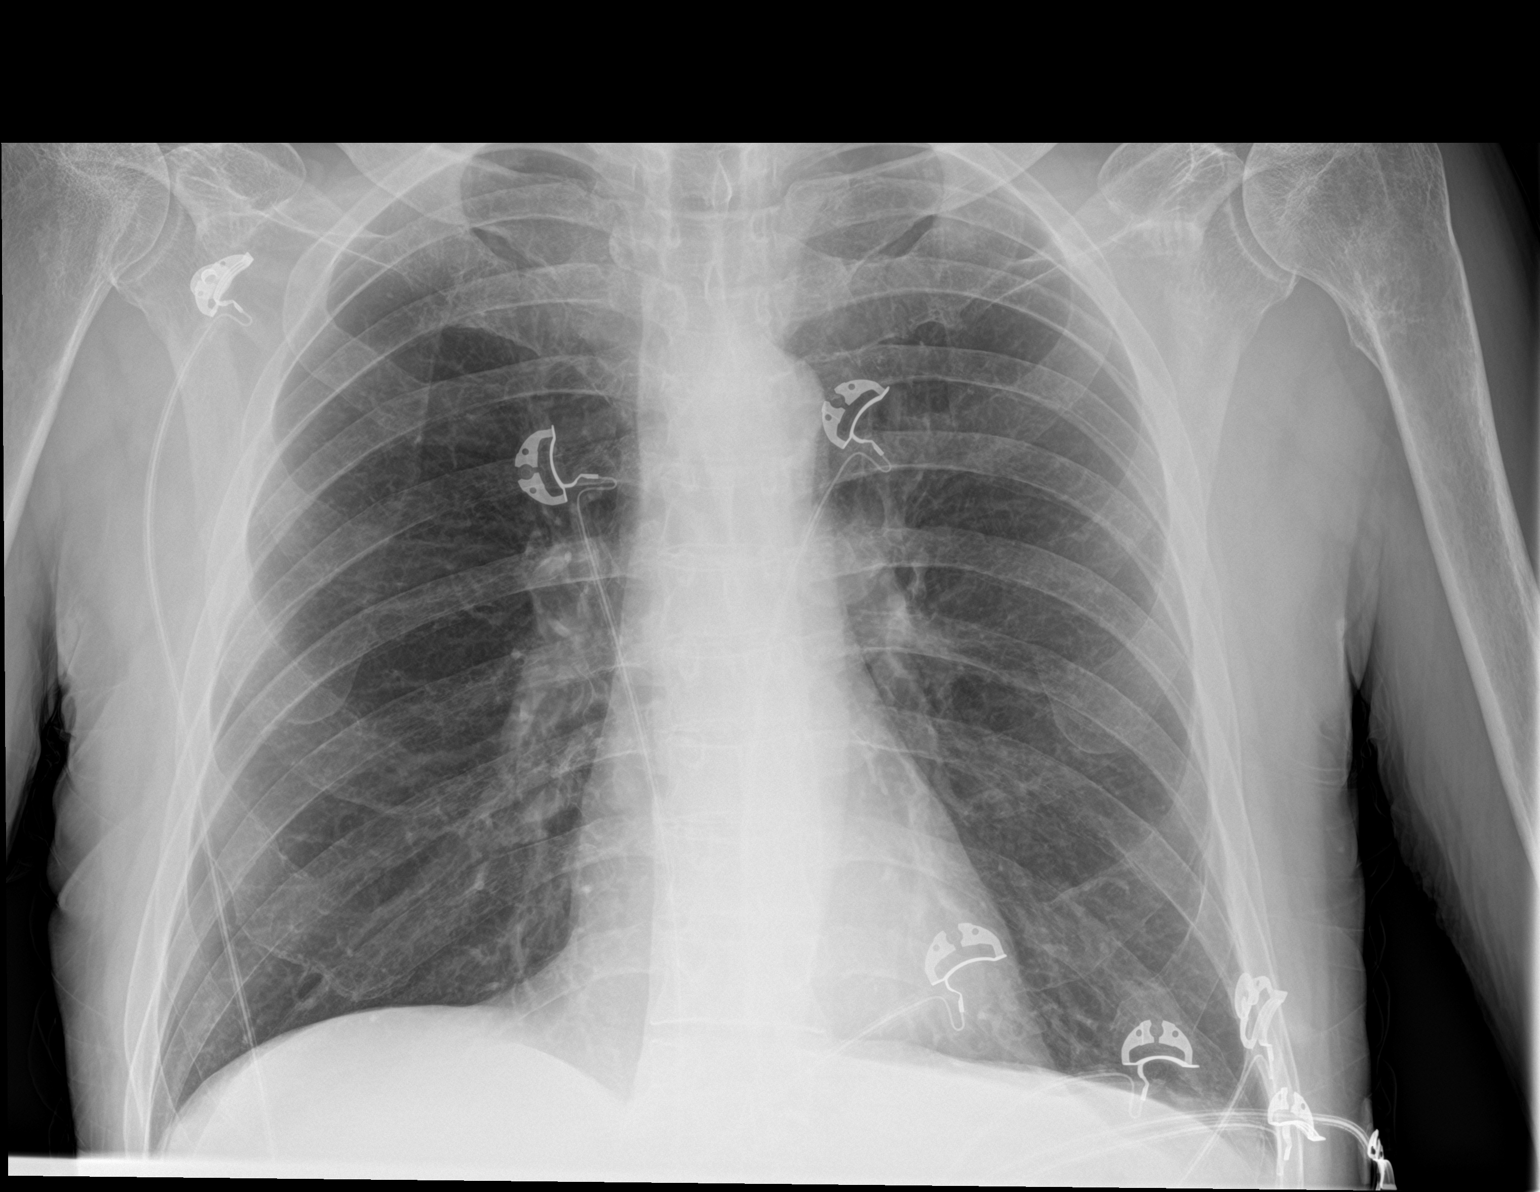

[2 of 2 positions shown; findings below may reference images not displayed]

FINDINGS: Hyperinflation. No focal consolidation or pleural effusion. Stable
cardiomediastinal silhouette with mild atherosclerosis. No
pneumothorax. Old appearing deformity of the proximal humerus on the
left. Mild to moderate compression at approximate T12, unchanged
IMPRESSION: No active cardiopulmonary disease.

## 2019-04-08 ENCOUNTER — Other Ambulatory Visit: Payer: Self-pay

## 2019-04-08 ENCOUNTER — Encounter (HOSPITAL_COMMUNITY): Payer: Self-pay | Admitting: *Deleted

## 2019-04-08 ENCOUNTER — Emergency Department (HOSPITAL_COMMUNITY)
Admission: EM | Admit: 2019-04-08 | Discharge: 2019-04-08 | Disposition: A | Discharge status: Home / Self Care | Payer: Medicare Other | Attending: Emergency Medicine | Admitting: Emergency Medicine

## 2019-04-08 ENCOUNTER — Ambulatory Visit (HOSPITAL_COMMUNITY): Admission: RE | Admit: 2019-04-08 | Payer: Medicare Other | Source: Ambulatory Visit

## 2019-04-08 ENCOUNTER — Encounter (HOSPITAL_COMMUNITY): Payer: Self-pay

## 2019-04-08 ENCOUNTER — Ambulatory Visit (HOSPITAL_COMMUNITY): Payer: Medicare Other

## 2019-04-08 ENCOUNTER — Emergency Department (HOSPITAL_COMMUNITY): Payer: Medicare Other

## 2019-04-08 ENCOUNTER — Emergency Department (HOSPITAL_COMMUNITY)
Admission: EM | Admit: 2019-04-08 | Discharge: 2019-04-08 | Discharge status: Against Medical Advice | Payer: Medicare Other | Source: Home / Self Care | Attending: Emergency Medicine | Admitting: Emergency Medicine

## 2019-04-08 DIAGNOSIS — Z5321 Procedure and treatment not carried out due to patient leaving prior to being seen by health care provider: Secondary | ICD-10-CM | POA: Insufficient documentation

## 2019-04-08 DIAGNOSIS — R0789 Other chest pain: Secondary | ICD-10-CM | POA: Insufficient documentation

## 2019-04-08 DIAGNOSIS — R079 Chest pain, unspecified: Secondary | ICD-10-CM | POA: Insufficient documentation

## 2019-04-08 DIAGNOSIS — F17210 Nicotine dependence, cigarettes, uncomplicated: Secondary | ICD-10-CM | POA: Insufficient documentation

## 2019-04-08 DIAGNOSIS — F17220 Nicotine dependence, chewing tobacco, uncomplicated: Secondary | ICD-10-CM | POA: Insufficient documentation

## 2019-04-08 DIAGNOSIS — R51 Headache: Secondary | ICD-10-CM | POA: Diagnosis not present

## 2019-04-08 DIAGNOSIS — R2689 Other abnormalities of gait and mobility: Secondary | ICD-10-CM | POA: Diagnosis not present

## 2019-04-08 DIAGNOSIS — Y908 Blood alcohol level of 240 mg/100 ml or more: Secondary | ICD-10-CM | POA: Insufficient documentation

## 2019-04-08 DIAGNOSIS — R0602 Shortness of breath: Secondary | ICD-10-CM | POA: Diagnosis not present

## 2019-04-08 DIAGNOSIS — Z79899 Other long term (current) drug therapy: Secondary | ICD-10-CM | POA: Insufficient documentation

## 2019-04-08 DIAGNOSIS — R4781 Slurred speech: Secondary | ICD-10-CM | POA: Insufficient documentation

## 2019-04-08 DIAGNOSIS — F10120 Alcohol abuse with intoxication, uncomplicated: Secondary | ICD-10-CM | POA: Insufficient documentation

## 2019-04-08 LAB — CBC
HCT: 37.3 % — ABNORMAL LOW (ref 39.0–52.0)
Hemoglobin: 13 g/dL (ref 13.0–17.0)
MCH: 32.7 pg (ref 26.0–34.0)
MCHC: 34.9 g/dL (ref 30.0–36.0)
MCV: 94 fL (ref 80.0–100.0)
Platelets: 142 10*3/uL — ABNORMAL LOW (ref 150–400)
RBC: 3.97 MIL/uL — ABNORMAL LOW (ref 4.22–5.81)
RDW: 15.4 % (ref 11.5–15.5)
WBC: 9.2 10*3/uL (ref 4.0–10.5)
nRBC: 0 % (ref 0.0–0.2)

## 2019-04-08 LAB — BASIC METABOLIC PANEL
Anion gap: 11 (ref 5–15)
BUN: <5 mg/dL — ABNORMAL LOW (ref 8–23)
CO2: 15 mmol/L — ABNORMAL LOW (ref 22–32)
Calcium: 8.4 mg/dL — ABNORMAL LOW (ref 8.9–10.3)
Chloride: 105 mmol/L (ref 98–111)
Creatinine, Ser: 0.67 mg/dL (ref 0.61–1.24)
GFR calc Af Amer: >60 mL/min (ref >60)
GFR calc non Af Amer: >60 mL/min (ref >60)
Glucose, Bld: 108 mg/dL — ABNORMAL HIGH (ref 70–99)
Potassium: 2.7 mmol/L — CL (ref 3.5–5.1)
Sodium: 131 mmol/L — ABNORMAL LOW (ref 135–145)

## 2019-04-08 LAB — ETHANOL: Alcohol, Ethyl (B): 317 mg/dL (ref <10)

## 2019-04-08 LAB — TROPONIN I (HIGH SENSITIVITY): Troponin I (High Sensitivity): 4 ng/L (ref <18)

## 2019-04-08 MED ORDER — SODIUM CHLORIDE 0.9% FLUSH: 3.0000 mL | Freq: Once | INTRAVENOUS | Status: DC

## 2019-04-08 NOTE — ED Triage Notes (Addendum)
Pt presents to ED with complaints of mid chest pain since 1600, w/ SOB. Pt also complaining of HA x 2 weeks, and difficulty ambulating x 2 weeks. Granddaughter also states they have noticed slurred speech for the last 4 days.

## 2019-04-08 NOTE — ED Notes (Signed)
Pt admits to drinking two beers today,

## 2019-04-08 NOTE — ED Notes (Signed)
EKG given to Dr. Cook  

## 2019-04-08 NOTE — ED Triage Notes (Signed)
Pt states that he came to the er earlier today, was checked in at triage for chest pain that started today, slurred speech for 4 days and problems walking for over two weeks. The pain became better and pt left. Returned to er by [employer],

## 2019-04-08 NOTE — ED Notes (Signed)
Date and time results received: 04/08/19 2228 (use smartphrase ".now" to insert current time)  Test: Ethanol Critical Value: 317  Name of Provider Notified: Dr. Lacinda Axon  Orders Received? Or Actions Taken?: none. pt left ama

## 2019-04-08 NOTE — ED Notes (Signed)
ekg done in triage and handed to Dr. Lacinda Axon

## 2019-04-08 NOTE — ED Notes (Signed)
Pt became aggravated with waiting. Pt left without signing.

## 2019-04-08 NOTE — ED Provider Notes (Addendum)
[facility] EMERGENCY DEPARTMENT Provider Note   CSN: [id number] Arrival date & time: 04/08/19  2056    History   Chief Complaint Chief Complaint  Patient presents with  . Chest Pain    HPI Douglas Stout is a 63 y.o. male.     Level 5 caveat for suspected alcohol intoxication.  Chief complaint chest pain since earlier this morning.  No obvious dyspnea, diaphoresis, nausea.  Nursing notes report slurred speech for 4 days and issues with ambulation over the past 2 weeks.  Patient was initially in the waiting room, left the hospital, and then called EMS to be transported to the hospital.  Past medical history includes alcoholism, GERD, COPD.       Past Medical History:  Diagnosis Date  . Alcoholism /alcohol abuse ([place])   . Complication of anesthesia   . Dyspnea   . GERD (gastroesophageal reflux disease)     Patient Active Problem List   Diagnosis Date Noted  . Special screening for malignant neoplasms, colon 10/07/2016    Past Surgical History:  Procedure Laterality Date  . BUNIONECTOMY Right   . COLONOSCOPY WITH PROPOFOL N/A 10/30/2016   Procedure: COLONOSCOPY WITH PROPOFOL;  Surgeon: Rogene Houston, MD;  Location: [facility];  Service: Endoscopy;  Laterality: N/A;  7:30  . POLYPECTOMY  10/30/2016   Procedure: POLYPECTOMY;  Surgeon: Rogene Houston, MD;  Location: [facility];  Service: Endoscopy;;  colon        Home Medications    Prior to Admission medications   Medication Sig Start Date End Date Taking? Authorizing Provider  albuterol (PROVENTIL HFA;VENTOLIN HFA) 108 (90 Base) MCG/ACT inhaler Inhale 1 puff into the lungs every 6 (six) hours as needed for wheezing or shortness of breath.   Yes [provider]  clonazePAM (KLONOPIN) 1 MG tablet Take 1 tablet by mouth daily. 03/23/19  Yes [provider]  pantoprazole (PROTONIX) 40 MG tablet Take 40 mg by mouth daily.   Yes [provider]  oxyCODONE (OXY IR/ROXICODONE) 5 MG immediate  release tablet Take 5 mg by mouth 3 (three) times daily.    [provider]  potassium chloride SA (K-DUR,KLOR-CON) 20 MEQ tablet Take 1 tablet (20 mEq total) by mouth daily. 08/29/18   Julianne Rice, MD  sucralfate (CARAFATE) 1 g tablet Take 1 tablet (1 g total) by mouth 4 (four) times daily -  with meals and at bedtime. 08/29/18   Julianne Rice, MD    Family History Family History  Problem Relation Age of Onset  . Cancer Mother   . Cancer Brother     Social History Social History   Tobacco Use  . Smoking status: Current Every Day Smoker    Packs/day: 2.00    Years: 42.00    Pack years: 84.00    Types: Cigarettes  . Smokeless tobacco: Current User    Types: Chew  Substance Use Topics  . Alcohol use: Yes    Comment: daily  . Drug use: No     Allergies   Patient has no known allergies.   Review of Systems Review of Systems  Unable to perform ROS: Other     Physical Exam Updated Vital Signs BP (!) 142/87   Pulse 93   Temp 97.8 F (36.6 C) (Oral)   Resp 20   Ht 5\' 8"  (1.727 m)   Wt 57.1 kg   SpO2 96%   BMI 19.14 kg/m   Physical Exam Vitals signs and  nursing note reviewed.  Constitutional:      Comments: Conversant, appears intoxicated  HENT:     Head: Normocephalic and atraumatic.  Eyes:     Conjunctiva/sclera: Conjunctivae normal.  Neck:     Musculoskeletal: Neck supple.  Cardiovascular:     Rate and Rhythm: Normal rate and regular rhythm.  Pulmonary:     Effort: Pulmonary effort is normal.     Breath sounds: Normal breath sounds.  Abdominal:     General: Bowel sounds are normal.     Palpations: Abdomen is soft.  Musculoskeletal: Normal range of motion.  Skin:    General: Skin is warm and dry.  Neurological:     Comments: Full range of motion of arms and legs; no obvious confusion.  Psychiatric:        Behavior: Behavior normal.      ED Treatments / Results  Labs (all labs ordered are listed, but only abnormal results  are displayed) Labs Reviewed  BASIC METABOLIC PANEL - Abnormal; Notable for the following components:      Result Value   Sodium 131 (*)    Potassium 2.7 (*)    CO2 15 (*)    Glucose, Bld 108 (*)    BUN <5 (*)    Calcium 8.4 (*)    All other components within normal limits  CBC - Abnormal; Notable for the following components:   RBC 3.97 (*)    HCT 37.3 (*)    Platelets 142 (*)    All other components within normal limits  ETHANOL - Abnormal; Notable for the following components:   Alcohol, Ethyl (B) 317 (*)    All other components within normal limits  TROPONIN I (HIGH SENSITIVITY)    EKG EKG Interpretation  Date/Time:  Saturday April 08 2019 21:08:51 EDT Ventricular Rate:  92 PR Interval:  158 QRS Duration: 98 QT Interval:  356 QTC Calculation: 440 R Axis:   128 Text Interpretation:  Normal sinus rhythm Left posterior fascicular block Nonspecific ST abnormality Abnormal ECG Confirmed by Nat Christen [phone]) on 04/08/2019 9:42:47 PM   Radiology Dg Chest 2 View  Result Date: 04/08/2019 CLINICAL DATA:  Chest pain. EXAM: CHEST - 2 VIEW COMPARISON:  August 29, 2018 FINDINGS: Cardiomediastinal silhouette is normal. Mediastinal contours appear intact. There is no evidence of focal airspace consolidation, pleural effusion or pneumothorax. Osseous structures are without acute abnormality. Soft tissues are grossly normal. IMPRESSION: No active cardiopulmonary disease. Electronically Signed   By: Fidela Salisbury M.D.   On: 04/08/2019 21:50   Ct Head Wo Contrast  Result Date: 04/08/2019 CLINICAL DATA:  Slurred speech. Chest pain. EXAM: CT HEAD WITHOUT CONTRAST TECHNIQUE: Contiguous axial images were obtained from the base of the skull through the vertex without intravenous contrast. COMPARISON:  Jan 11, 2018 FINDINGS: Brain: No evidence of acute infarction, hemorrhage, hydrocephalus, extra-axial collection or mass lesion/mass effect. Moderate brain parenchymal volume loss. Mild deep  white matter microangiopathy Vascular: Calcific atherosclerotic disease of the intra cavernous carotid arteries. Skull: Normal. Negative for fracture or focal lesion. Sinuses/Orbits: Mild mucosal thickening of the ethmoid and right frontal sinuses. Other: None. IMPRESSION: 1. No acute intracranial abnormality. 2. Moderate brain parenchymal atrophy and chronic microvascular disease. Electronically Signed   By: Fidela Salisbury M.D.   On: 04/08/2019 21:58    Procedures Procedures (including critical care time)  Medications Ordered in ED Medications - No data to display   Initial Impression / Assessment and Plan / ED Course  I have reviewed  the triage vital signs and the nursing notes.  Pertinent labs & imaging results that were available during my care of the patient were reviewed by me and considered in my medical decision making (see chart for details).       Patient appears to be intoxicated and difficult to evaluate.  Will obtain typical cardiac work-up and delta troponin along with CT head.  Patient left the emergency department before nursing staff or physician staff was able to discuss any test results with him.  I was unaware of his absence until he was gone. Final Clinical Impressions(s) / ED Diagnoses   Final diagnoses:  Chest pain, unspecified type    ED Discharge Orders    None       Nat Christen, MD 04/08/19 2139    Nat Christen, MD 04/08/19 5537    Nat Christen, MD 04/09/19 [phone]

## 2019-04-08 NOTE — ED Notes (Signed)
Date and time results received: 04/08/19 2235 (use smartphrase ".now" to insert current time)  Test: Potassium Critical Value: 2.7  Name of Provider Notified: Dr Lacinda Axon  Orders Received? Or Actions Taken?: none. Pt left AMA

## 2019-12-01 IMAGING — CT CT MAXILLOFACIAL W/O CM
4 of 6 series · 16 of 47 positions shown, 18 images · non-contrast
Comparison: 02/06/2017 CT head.

CLINICAL DATA: 61 y/o M; fall from bed with laceration of the right
eyebrow.

EXAM:
CT HEAD WITHOUT CONTRAST
CT MAXILLOFACIAL WITHOUT CONTRAST
TECHNIQUE: Multidetector CT imaging of the head and maxillofacial structures
were performed using the standard protocol without intravenous
contrast. Multiplanar CT image reconstructions of the maxillofacial
structures were also generated.

[Series 2: head wo · axial · 0.47mm/px · z∈[+1182,+1302]mm · 7 of 34 slices shown, 9 images]
[im 5/34  brain]
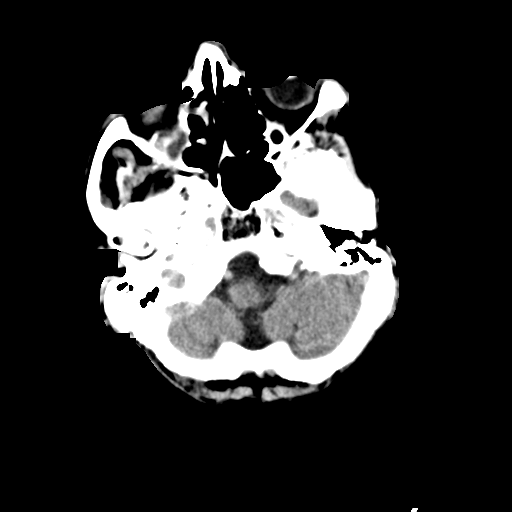
[im 5/34  bone]
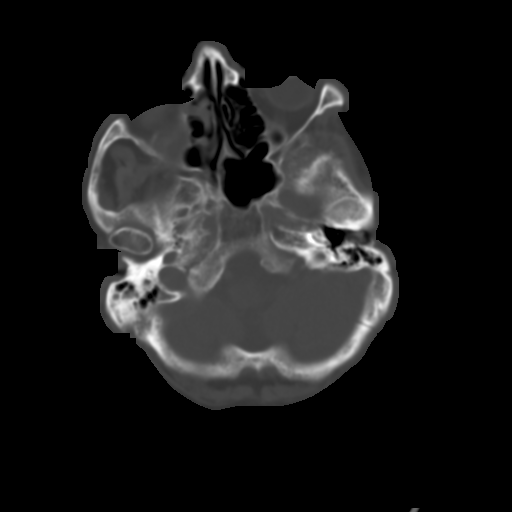
[im 9/34  bone]
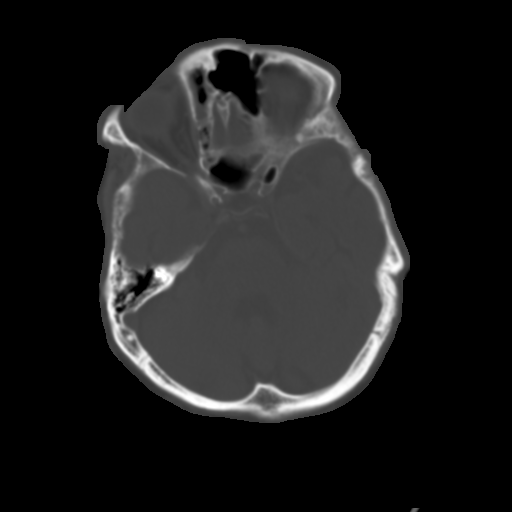
[im 13/34  bone]
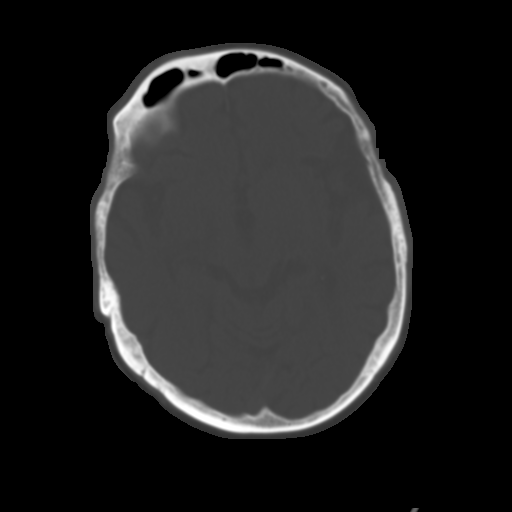
[im 17/34  bone]
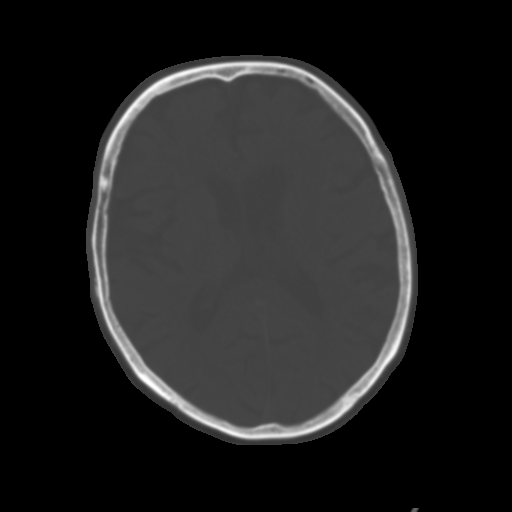
[im 21/34  brain]
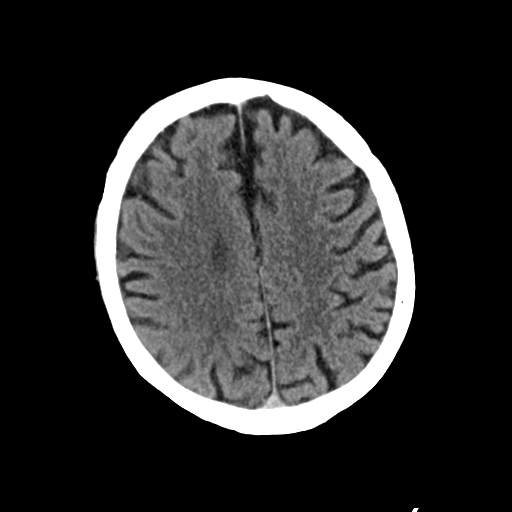
[im 21/34  bone]
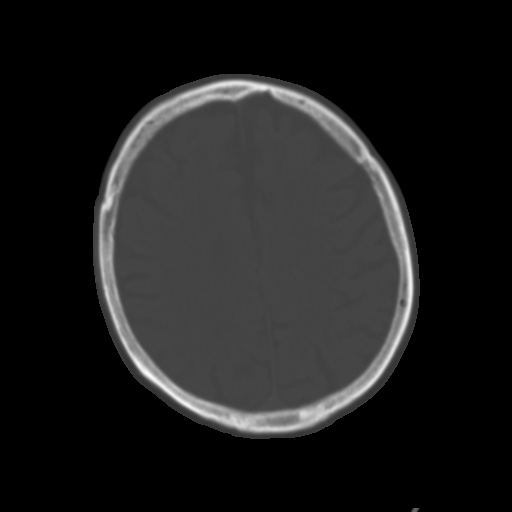
[im 25/34  bone]
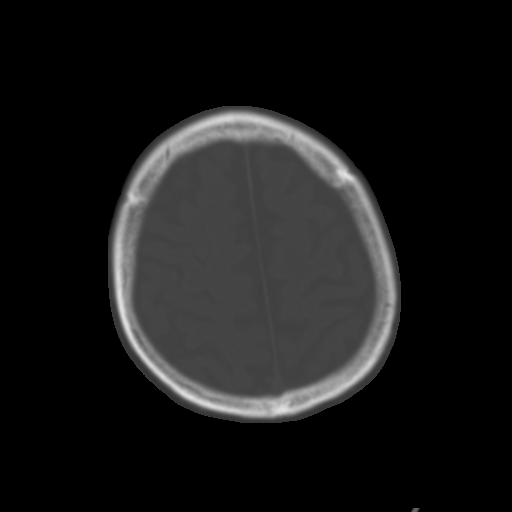
[im 29/34  bone]
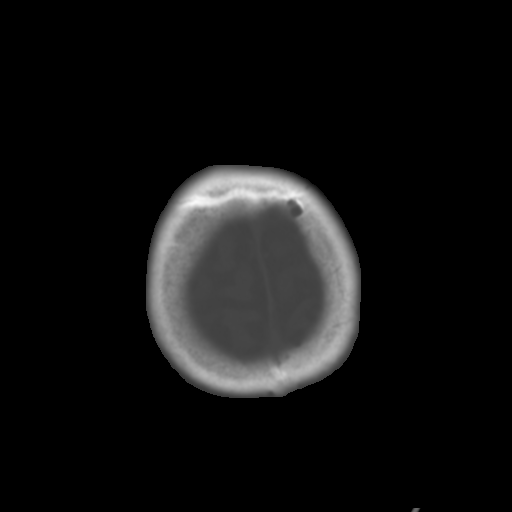

[Series 6: max soft · axial · 0.33mm/px · z∈[+1076,+1134]mm · 4 of 84 slices shown]
[im 9/84  brain]
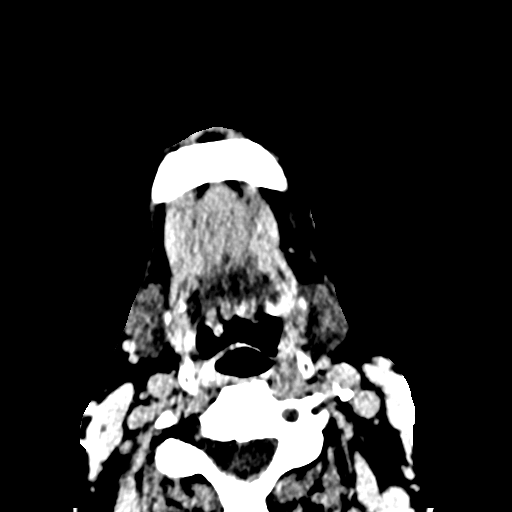
[im 17/84  brain]
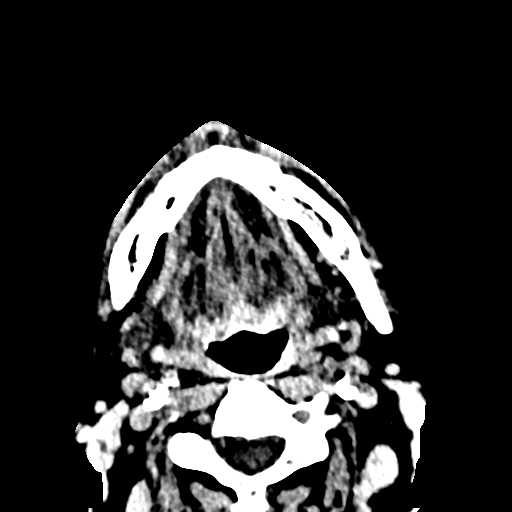
[im 25/84  brain]
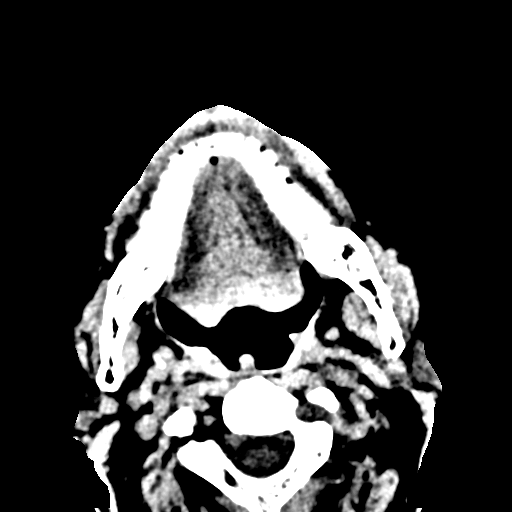
[im 38/84  brain]
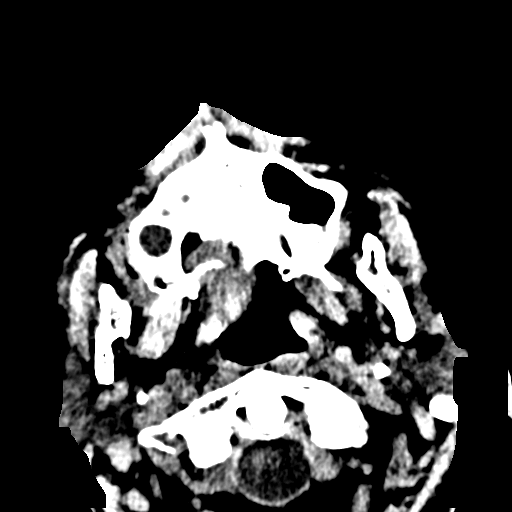

[Series 10: coronal soft · coronal · 0.34mm/px · 3 of 71 slices shown]
[im 18/71  bone]
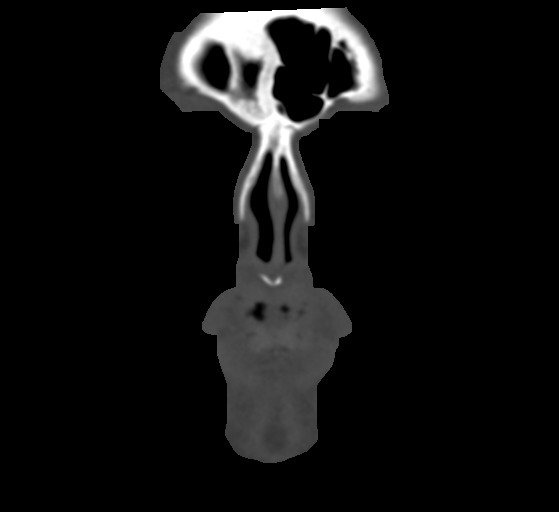
[im 36/71  bone]
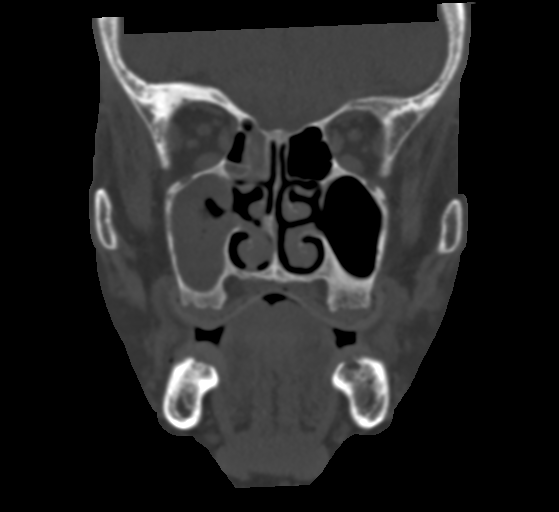
[im 53/71  bone]
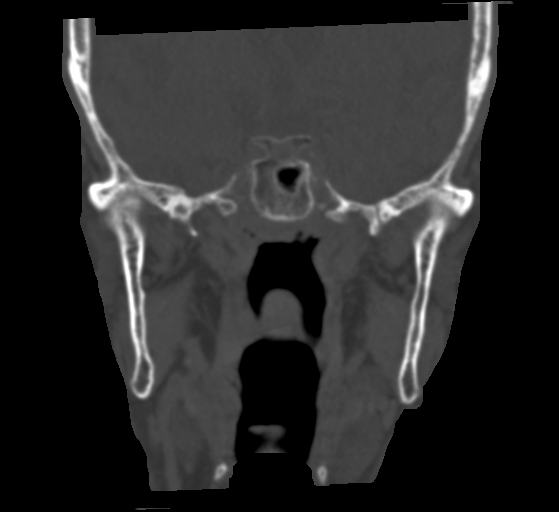

[Series 11: sagittal soft · sagittal · 0.34mm/px · 2 of 78 slices shown]
[im 26/78  bone]
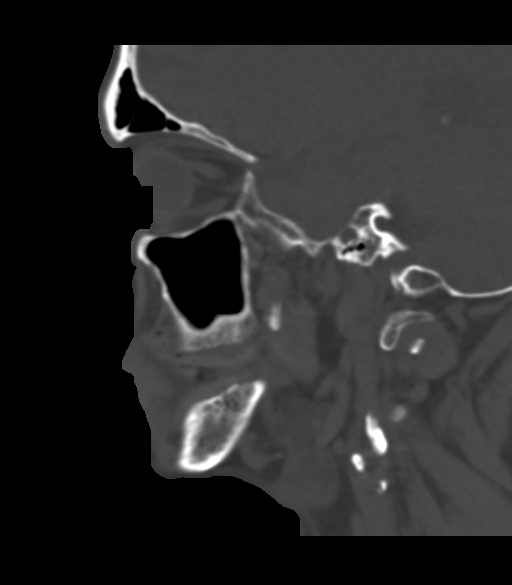
[im 52/78  bone]
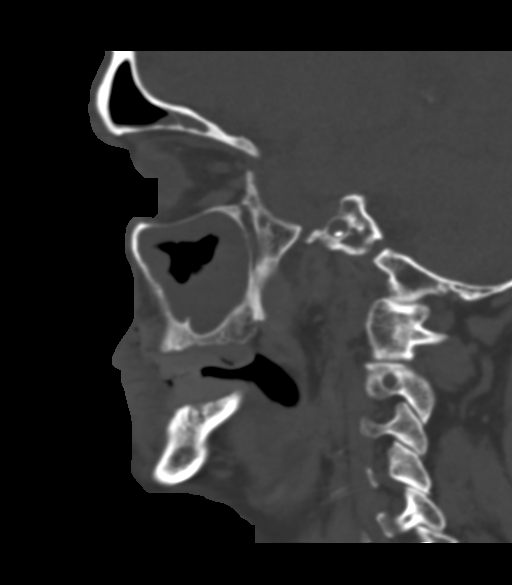

[16 of 47 positions shown; findings below may reference images not displayed]

FINDINGS: CT HEAD FINDINGS

Brain: No evidence of acute infarction, hemorrhage, hydrocephalus,
extra-axial collection or mass lesion/mass effect. Stable chronic
microvascular ischemic changes and parenchymal volume loss of the
brain.

Vascular: Calcific atherosclerosis of carotid siphons.

Skull: Normal. Negative for fracture or focal lesion.

Other: None.

CT MAXILLOFACIAL FINDINGS

Osseous: No fracture or mandibular dislocation. No destructive
process.

Orbits: Negative. No traumatic or inflammatory finding.

Sinuses: Partial opacification of right frontal, ethmoid, and
maxillary sinuses with aerosolized secretions. Opacification of the
right mastoid tip.

Soft tissues: Soft tissue laceration and contusion involving the
right superior medial periorbital soft tissues.
IMPRESSION: 1. Laceration and contusion involving the right superior and medial
periorbital soft tissues.
2. No traumatic or inflammatory finding of the orbits.
3. No acute facial fracture or mandibular dislocation.
4. No acute calvarial fracture or intracranial abnormality.
5. Stable chronic microvascular ischemic changes and parenchymal
volume loss of the brain.
6. Partial opacification of right frontal, ethmoid, and maxillary
sinuses with aerosolized secretions which may represent acute
sinusitis.

By: Jhosep Boogie M.D.

## 2020-05-25 ENCOUNTER — Inpatient Hospital Stay (HOSPITAL_COMMUNITY)
Admission: EM | Admit: 2020-05-25 | Discharge: 2020-07-01 | DRG: 441 | Disposition: E | Payer: Medicare HMO | Attending: Family Medicine | Admitting: Family Medicine

## 2020-05-25 ENCOUNTER — Other Ambulatory Visit: Payer: Self-pay

## 2020-05-25 ENCOUNTER — Emergency Department (HOSPITAL_COMMUNITY): Payer: Medicare HMO

## 2020-05-25 ENCOUNTER — Encounter (HOSPITAL_COMMUNITY): Payer: Self-pay | Admitting: Emergency Medicine

## 2020-05-25 DIAGNOSIS — F1190 Opioid use, unspecified, uncomplicated: Secondary | ICD-10-CM | POA: Diagnosis not present

## 2020-05-25 DIAGNOSIS — R0603 Acute respiratory distress: Secondary | ICD-10-CM | POA: Diagnosis not present

## 2020-05-25 DIAGNOSIS — N179 Acute kidney failure, unspecified: Secondary | ICD-10-CM | POA: Diagnosis present

## 2020-05-25 DIAGNOSIS — Z20822 Contact with and (suspected) exposure to covid-19: Secondary | ICD-10-CM | POA: Diagnosis present

## 2020-05-25 DIAGNOSIS — K219 Gastro-esophageal reflux disease without esophagitis: Secondary | ICD-10-CM | POA: Diagnosis present

## 2020-05-25 DIAGNOSIS — F1020 Alcohol dependence, uncomplicated: Secondary | ICD-10-CM | POA: Diagnosis present

## 2020-05-25 DIAGNOSIS — E872 Acidosis, unspecified: Secondary | ICD-10-CM | POA: Diagnosis present

## 2020-05-25 DIAGNOSIS — R64 Cachexia: Secondary | ICD-10-CM | POA: Diagnosis present

## 2020-05-25 DIAGNOSIS — I10 Essential (primary) hypertension: Secondary | ICD-10-CM | POA: Diagnosis present

## 2020-05-25 DIAGNOSIS — F1110 Opioid abuse, uncomplicated: Secondary | ICD-10-CM | POA: Diagnosis present

## 2020-05-25 DIAGNOSIS — G9340 Encephalopathy, unspecified: Secondary | ICD-10-CM

## 2020-05-25 DIAGNOSIS — IMO0001 Reserved for inherently not codable concepts without codable children: Secondary | ICD-10-CM | POA: Diagnosis present

## 2020-05-25 DIAGNOSIS — G928 Other toxic encephalopathy: Secondary | ICD-10-CM | POA: Diagnosis present

## 2020-05-25 DIAGNOSIS — Z515 Encounter for palliative care: Secondary | ICD-10-CM | POA: Diagnosis not present

## 2020-05-25 DIAGNOSIS — R54 Age-related physical debility: Secondary | ICD-10-CM | POA: Diagnosis present

## 2020-05-25 DIAGNOSIS — R636 Underweight: Secondary | ICD-10-CM | POA: Diagnosis present

## 2020-05-25 DIAGNOSIS — E876 Hypokalemia: Secondary | ICD-10-CM | POA: Diagnosis present

## 2020-05-25 DIAGNOSIS — K7460 Unspecified cirrhosis of liver: Secondary | ICD-10-CM | POA: Diagnosis present

## 2020-05-25 DIAGNOSIS — F17210 Nicotine dependence, cigarettes, uncomplicated: Secondary | ICD-10-CM | POA: Diagnosis present

## 2020-05-25 DIAGNOSIS — R062 Wheezing: Secondary | ICD-10-CM

## 2020-05-25 DIAGNOSIS — F1199 Opioid use, unspecified with unspecified opioid-induced disorder: Secondary | ICD-10-CM | POA: Diagnosis not present

## 2020-05-25 DIAGNOSIS — E870 Hyperosmolality and hypernatremia: Secondary | ICD-10-CM | POA: Diagnosis present

## 2020-05-25 DIAGNOSIS — E860 Dehydration: Secondary | ICD-10-CM | POA: Diagnosis present

## 2020-05-25 DIAGNOSIS — K7682 Hepatic encephalopathy: Secondary | ICD-10-CM | POA: Diagnosis present

## 2020-05-25 DIAGNOSIS — Z79899 Other long term (current) drug therapy: Secondary | ICD-10-CM

## 2020-05-25 DIAGNOSIS — M6282 Rhabdomyolysis: Secondary | ICD-10-CM | POA: Diagnosis present

## 2020-05-25 DIAGNOSIS — G9341 Metabolic encephalopathy: Secondary | ICD-10-CM

## 2020-05-25 DIAGNOSIS — Z66 Do not resuscitate: Secondary | ICD-10-CM | POA: Diagnosis not present

## 2020-05-25 DIAGNOSIS — K7290 Hepatic failure, unspecified without coma: Principal | ICD-10-CM | POA: Diagnosis present

## 2020-05-25 DIAGNOSIS — Z7189 Other specified counseling: Secondary | ICD-10-CM | POA: Diagnosis not present

## 2020-05-25 DIAGNOSIS — T796XXA Traumatic ischemia of muscle, initial encounter: Secondary | ICD-10-CM | POA: Diagnosis not present

## 2020-05-25 DIAGNOSIS — R188 Other ascites: Secondary | ICD-10-CM | POA: Diagnosis present

## 2020-05-25 DIAGNOSIS — R791 Abnormal coagulation profile: Secondary | ICD-10-CM | POA: Diagnosis present

## 2020-05-25 DIAGNOSIS — E7220 Disorder of urea cycle metabolism, unspecified: Secondary | ICD-10-CM

## 2020-05-25 DIAGNOSIS — Z681 Body mass index (BMI) 19 or less, adult: Secondary | ICD-10-CM

## 2020-05-25 DIAGNOSIS — R7989 Other specified abnormal findings of blood chemistry: Secondary | ICD-10-CM

## 2020-05-25 LAB — CBC WITH DIFFERENTIAL/PLATELET
Abs Immature Granulocytes: 0.04 10*3/uL (ref 0.00–0.07)
Basophils Absolute: 0 10*3/uL (ref 0.0–0.1)
Basophils Relative: 0 %
Eosinophils Absolute: 0 10*3/uL (ref 0.0–0.5)
Eosinophils Relative: 0 %
HCT: 29.7 % — ABNORMAL LOW (ref 39.0–52.0)
Hemoglobin: 10.6 g/dL — ABNORMAL LOW (ref 13.0–17.0)
Immature Granulocytes: 0 %
Lymphocytes Relative: 8 %
Lymphs Abs: 0.9 10*3/uL (ref 0.7–4.0)
MCH: 33.5 pg (ref 26.0–34.0)
MCHC: 35.7 g/dL (ref 30.0–36.0)
MCV: 94 fL (ref 80.0–100.0)
Monocytes Absolute: 1.3 10*3/uL — ABNORMAL HIGH (ref 0.1–1.0)
Monocytes Relative: 13 %
Neutro Abs: 8.2 10*3/uL — ABNORMAL HIGH (ref 1.7–7.7)
Neutrophils Relative %: 79 %
Platelets: 183 10*3/uL (ref 150–400)
RBC: 3.16 MIL/uL — ABNORMAL LOW (ref 4.22–5.81)
RDW: 15.7 % — ABNORMAL HIGH (ref 11.5–15.5)
WBC: 10.5 10*3/uL (ref 4.0–10.5)
nRBC: 0 % (ref 0.0–0.2)

## 2020-05-25 LAB — CBC
HCT: 26.3 % — ABNORMAL LOW (ref 39.0–52.0)
Hemoglobin: 9.3 g/dL — ABNORMAL LOW (ref 13.0–17.0)
MCH: 33.5 pg (ref 26.0–34.0)
MCHC: 35.4 g/dL (ref 30.0–36.0)
MCV: 94.6 fL (ref 80.0–100.0)
Platelets: 147 10*3/uL — ABNORMAL LOW (ref 150–400)
RBC: 2.78 MIL/uL — ABNORMAL LOW (ref 4.22–5.81)
RDW: 15.6 % — ABNORMAL HIGH (ref 11.5–15.5)
WBC: 10.5 10*3/uL (ref 4.0–10.5)
nRBC: 0 % (ref 0.0–0.2)

## 2020-05-25 LAB — BASIC METABOLIC PANEL
Anion gap: 10 (ref 5–15)
BUN: 12 mg/dL (ref 8–23)
CO2: 13 mmol/L — ABNORMAL LOW (ref 22–32)
Calcium: 7.4 mg/dL — ABNORMAL LOW (ref 8.9–10.3)
Chloride: 113 mmol/L — ABNORMAL HIGH (ref 98–111)
Creatinine, Ser: 1.13 mg/dL (ref 0.61–1.24)
GFR calc Af Amer: >60 mL/min (ref >60)
GFR calc non Af Amer: >60 mL/min (ref >60)
Glucose, Bld: 113 mg/dL — ABNORMAL HIGH (ref 70–99)
Potassium: 2.1 mmol/L — CL (ref 3.5–5.1)
Sodium: 136 mmol/L (ref 135–145)

## 2020-05-25 LAB — PROTIME-INR
INR: 1.8 — ABNORMAL HIGH (ref 0.8–1.2)
Prothrombin Time: 20.5 seconds — ABNORMAL HIGH (ref 11.4–15.2)

## 2020-05-25 LAB — LIPASE, BLOOD: Lipase: 33 U/L (ref 11–51)

## 2020-05-25 LAB — COMPREHENSIVE METABOLIC PANEL
ALT: 31 U/L (ref 0–44)
AST: 104 U/L — ABNORMAL HIGH (ref 15–41)
Albumin: 2.5 g/dL — ABNORMAL LOW (ref 3.5–5.0)
Alkaline Phosphatase: 91 U/L (ref 38–126)
Anion gap: 14 (ref 5–15)
BUN: 13 mg/dL (ref 8–23)
CO2: 16 mmol/L — ABNORMAL LOW (ref 22–32)
Calcium: 8.5 mg/dL — ABNORMAL LOW (ref 8.9–10.3)
Chloride: 109 mmol/L (ref 98–111)
Creatinine, Ser: 1.37 mg/dL — ABNORMAL HIGH (ref 0.61–1.24)
GFR calc Af Amer: >60 mL/min (ref >60)
GFR calc non Af Amer: 54 mL/min — ABNORMAL LOW (ref >60)
Glucose, Bld: 103 mg/dL — ABNORMAL HIGH (ref 70–99)
Potassium: 3.1 mmol/L — ABNORMAL LOW (ref 3.5–5.1)
Sodium: 139 mmol/L (ref 135–145)
Total Bilirubin: 3.8 mg/dL — ABNORMAL HIGH (ref 0.3–1.2)
Total Protein: 7.3 g/dL (ref 6.5–8.1)

## 2020-05-25 LAB — AMMONIA: Ammonia: 128 umol/L — ABNORMAL HIGH (ref 9–35)

## 2020-05-25 LAB — MAGNESIUM: Magnesium: 1.4 mg/dL — ABNORMAL LOW (ref 1.7–2.4)

## 2020-05-25 LAB — PHOSPHORUS: Phosphorus: 2 mg/dL — ABNORMAL LOW (ref 2.5–4.6)

## 2020-05-25 LAB — RESP PANEL BY RT PCR (RSV, FLU A&B, COVID)
Influenza A by PCR: NEGATIVE
Influenza B by PCR: NEGATIVE
Respiratory Syncytial Virus by PCR: NEGATIVE
SARS Coronavirus 2 by RT PCR: NEGATIVE

## 2020-05-25 LAB — ACETAMINOPHEN LEVEL: Acetaminophen (Tylenol), Serum: <10 ug/mL — ABNORMAL LOW (ref 10–30)

## 2020-05-25 LAB — ETHANOL: Alcohol, Ethyl (B): <10 mg/dL (ref <10)

## 2020-05-25 LAB — LACTIC ACID, PLASMA
Lactic Acid, Venous: 4.3 mmol/L (ref 0.5–1.9)
Lactic Acid, Venous: 1.5 mmol/L (ref 0.5–1.9)

## 2020-05-25 LAB — CK: Total CK: 646 U/L — ABNORMAL HIGH (ref 49–397)

## 2020-05-25 LAB — APTT: aPTT: 38 seconds — ABNORMAL HIGH (ref 24–36)

## 2020-05-25 LAB — CBG MONITORING, ED: Glucose-Capillary: 99 mg/dL (ref 70–99)

## 2020-05-25 MED ORDER — LACTULOSE ENEMA
300.0000 mL | Freq: Four times a day (QID) | ORAL | Status: DC | PRN
  Administered 2020-05-26 – 2020-05-27 (×2): 300 mL via RECTAL
  Filled 2020-05-25 (×4): qty 300

## 2020-05-25 MED ORDER — SODIUM CHLORIDE 0.9 % IV SOLN: INTRAVENOUS | Status: DC

## 2020-05-25 MED ORDER — LORAZEPAM 2 MG/ML IJ SOLN
0.0000 mg | Freq: Three times a day (TID) | INTRAMUSCULAR | Status: AC
  Administered 2020-05-29: 2 mg via INTRAVENOUS
  Filled 2020-05-25: qty 1

## 2020-05-25 MED ORDER — THIAMINE HCL 100 MG/ML IJ SOLN
100.0000 mg | Freq: Every day | INTRAMUSCULAR | Status: DC
  Administered 2020-05-26 – 2020-05-28 (×3): 100 mg via INTRAVENOUS
  Filled 2020-05-25 (×3): qty 2

## 2020-05-25 MED ORDER — SODIUM CHLORIDE 0.9 % IV BOLUS (SEPSIS)
1000.0000 mL | Freq: Once | INTRAVENOUS | Status: AC
  Administered 2020-05-25: 1000 mL via INTRAVENOUS

## 2020-05-25 MED ORDER — VANCOMYCIN HCL 500 MG/100ML IV SOLN
500.0000 mg | Freq: Two times a day (BID) | INTRAVENOUS | Status: DC
  Filled 2020-05-25 (×3): qty 100

## 2020-05-25 MED ORDER — SODIUM CHLORIDE 0.9 % IV SOLN: 2.0000 g | Freq: Two times a day (BID) | INTRAVENOUS | Status: DC

## 2020-05-25 MED ORDER — SODIUM CHLORIDE 0.9 % IV BOLUS (SEPSIS)
500.0000 mL | Freq: Once | INTRAVENOUS | Status: AC
  Administered 2020-05-25: 500 mL via INTRAVENOUS

## 2020-05-25 MED ORDER — ONDANSETRON HCL 4 MG/2ML IJ SOLN: 4.0000 mg | Freq: Four times a day (QID) | INTRAMUSCULAR | Status: DC | PRN

## 2020-05-25 MED ORDER — VANCOMYCIN HCL IN DEXTROSE 1-5 GM/200ML-% IV SOLN
1000.0000 mg | Freq: Once | INTRAVENOUS | Status: AC
  Administered 2020-05-25: 1000 mg via INTRAVENOUS
  Filled 2020-05-25: qty 200

## 2020-05-25 MED ORDER — SODIUM CHLORIDE 0.9 % IV SOLN
2.0000 g | Freq: Once | INTRAVENOUS | Status: AC
  Administered 2020-05-25: 2 g via INTRAVENOUS
  Filled 2020-05-25: qty 2

## 2020-05-25 MED ORDER — LACTULOSE 10 GM/15ML PO SOLN
30.0000 g | Freq: Two times a day (BID) | ORAL | Status: DC
  Administered 2020-05-26: 30 g via ORAL
  Filled 2020-05-25 (×3): qty 60

## 2020-05-25 MED ORDER — LORAZEPAM 2 MG/ML IJ SOLN
0.0000 mg | INTRAMUSCULAR | Status: AC
  Administered 2020-05-25 – 2020-05-26 (×5): 2 mg via INTRAVENOUS
  Filled 2020-05-25 (×5): qty 1

## 2020-05-25 MED ORDER — FOLIC ACID 1 MG PO TABS
1.0000 mg | ORAL_TABLET | Freq: Every day | ORAL | Status: DC
  Filled 2020-05-25 (×2): qty 1

## 2020-05-25 MED ORDER — ADULT MULTIVITAMIN W/MINERALS CH
1.0000 | ORAL_TABLET | Freq: Every day | ORAL | Status: DC
  Filled 2020-05-25 (×2): qty 1

## 2020-05-25 MED ORDER — SODIUM CHLORIDE 0.9 % IV BOLUS (SEPSIS)
250.0000 mL | Freq: Once | INTRAVENOUS | Status: AC
  Administered 2020-05-25: 250 mL via INTRAVENOUS

## 2020-05-25 MED ORDER — METRONIDAZOLE IN NACL 5-0.79 MG/ML-% IV SOLN
500.0000 mg | Freq: Once | INTRAVENOUS | Status: AC
  Administered 2020-05-25: 500 mg via INTRAVENOUS
  Filled 2020-05-25: qty 100

## 2020-05-25 MED ORDER — LORAZEPAM 1 MG PO TABS: 1.0000 mg | ORAL_TABLET | ORAL | Status: AC | PRN

## 2020-05-25 MED ORDER — THIAMINE HCL 100 MG PO TABS
100.0000 mg | ORAL_TABLET | Freq: Every day | ORAL | Status: DC
  Filled 2020-05-25: qty 1

## 2020-05-25 MED ORDER — PANTOPRAZOLE SODIUM 40 MG IV SOLR
40.0000 mg | Freq: Every day | INTRAVENOUS | Status: DC
  Administered 2020-05-25 – 2020-05-30 (×6): 40 mg via INTRAVENOUS
  Filled 2020-05-25 (×6): qty 40

## 2020-05-25 MED ORDER — THIAMINE HCL 100 MG/ML IJ SOLN
100.0000 mg | Freq: Once | INTRAMUSCULAR | Status: AC
  Administered 2020-05-25: 100 mg via INTRAVENOUS
  Filled 2020-05-25: qty 2

## 2020-05-25 MED ORDER — LORAZEPAM 2 MG/ML IJ SOLN
1.0000 mg | INTRAMUSCULAR | Status: AC | PRN
  Administered 2020-05-26: 2 mg via INTRAVENOUS
  Administered 2020-05-26: 4 mg via INTRAVENOUS
  Filled 2020-05-25: qty 1
  Filled 2020-05-25: qty 2
  Filled 2020-05-25: qty 1

## 2020-05-25 MED ORDER — ONDANSETRON HCL 4 MG PO TABS: 4.0000 mg | ORAL_TABLET | Freq: Four times a day (QID) | ORAL | Status: DC | PRN

## 2020-05-25 NOTE — ED Notes (Addendum)
Date and time results received: 05/08/2020 5:50 PM    Test: Lactic  Critical Value: 4.3  Name of Provider Notified:  Margarita Mail PA   Orders Received? Or Actions Taken? See orders

## 2020-05-25 NOTE — Progress Notes (Signed)
Communicated with bedside RN who will be asking for 2nd lactic acid order, pt was a difficult stick which delayed blood cultures, ED RN didn't want to delay Abx's due to difficult stick

## 2020-05-25 NOTE — ED Triage Notes (Signed)
Pt from home with partner. Pt has been snorting oxycodone and alcohol. Pt AMS.

## 2020-05-25 NOTE — H&P (Addendum)
History and Physical  KYMIR COLES IOX:[id number] DOB: Oct 16, 1955 DOA: 05/17/2020  Referring physician: Margarita Mail, PA-C, ED provider PCP: Lucia Gaskins, MD  Outpatient Specialists:   Patient Coming From: home  Chief Complaint: somnolence  HPI: Douglas Stout is a 64 y.o. male with a history of hypertension, alcoholism, GERD.  Patient is hypersomnolent so HPI obtained from EDP and medical records.  Patient brought to the emergency department due to hypersomnolence.  By report, the patient is alcoholic and crushes his narcotics and takes them intranasally.  He and her friend were doing this last night.  The wife found him on the floor this morning.  He remained on the floor for the majority of the day and he remained unresponsive.  His wife finally called EMS around 4 PM.  Patient found to have elevated ammonia level, elevated CK.  Imaging normal.  Patient started IV fluids and given a lactulose rectal enema.  Review of Systems:  Unable to obtain  Past Medical History:  Diagnosis Date  . Alcoholism /alcohol abuse ([place])   . Complication of anesthesia   . Dyspnea   . GERD (gastroesophageal reflux disease)    Past Surgical History:  Procedure Laterality Date  . BUNIONECTOMY Right   . COLONOSCOPY WITH PROPOFOL N/A 10/30/2016   Procedure: COLONOSCOPY WITH PROPOFOL;  Surgeon: Rogene Houston, MD;  Location: [facility];  Service: Endoscopy;  Laterality: N/A;  7:30  . POLYPECTOMY  10/30/2016   Procedure: POLYPECTOMY;  Surgeon: Rogene Houston, MD;  Location: [facility];  Service: Endoscopy;;  colon   Social History:  reports that he has been smoking cigarettes. He has a 84.00 pack-year smoking history. His smokeless tobacco use includes chew. He reports current alcohol use. He reports that he does not use drugs. Patient lives at home  No Known Allergies  Family History  Problem Relation Age of Onset  . Cancer Mother   . Cancer Brother       Prior to Admission medications     Medication Sig Start Date End Date Taking? Authorizing Provider  albuterol (PROVENTIL HFA;VENTOLIN HFA) 108 (90 Base) MCG/ACT inhaler Inhale 1 puff into the lungs every 6 (six) hours as needed for wheezing or shortness of breath.    [provider]  clonazePAM (KLONOPIN) 1 MG tablet Take 1 tablet by mouth daily. 03/23/19   [provider]  oxyCODONE (OXY IR/ROXICODONE) 5 MG immediate release tablet Take 5 mg by mouth 3 (three) times daily.    [provider]  pantoprazole (PROTONIX) 40 MG tablet Take 40 mg by mouth daily.    [provider]  potassium chloride SA (K-DUR,KLOR-CON) 20 MEQ tablet Take 1 tablet (20 mEq total) by mouth daily. 08/29/18   Julianne Rice, MD  sucralfate (CARAFATE) 1 g tablet Take 1 tablet (1 g total) by mouth 4 (four) times daily -  with meals and at bedtime. 08/29/18   Julianne Rice, MD    Physical Exam: BP 105/63   Pulse (!) 116   Temp (!) 97.3 F (36.3 C) (Rectal)   Resp 18   Ht 5\' 8"  (1.727 m)   Wt 56.7 kg   SpO2 100%   BMI 19.01 kg/m   . General: Older male.  Confused and somnolent, but moves extremities and is quite restless. No acute cardiopulmonary distress.  Marland Kitchen HEENT: Normocephalic atraumatic.  Right and left ears normal in appearance.  Pupils equal, round, reactive to light. Extraocular muscles are intact. Sclerae anicteric and noninjected.  Moist mucosal membranes. No mucosal lesions.  . Neck: Neck supple without lymphadenopathy. No carotid bruits. No masses palpated.  . Cardiovascular: Regular rate with normal S1-S2 sounds. No murmurs, rubs, gallops auscultated. No JVD.  Marland Kitchen Respiratory: Good respiratory effort with no wheezes, rales, rhonchi. Lungs clear to auscultation bilaterally.  No accessory muscle use. . Abdomen: Soft, nontender, nondistended. Active bowel sounds. No masses or hepatosplenomegaly  . Skin: No rashes, lesions, or ulcerations.  Dry, warm to touch. 2+ dorsalis pedis and radial  pulses. . Musculoskeletal: No calf or leg pain. All major joints not erythematous nontender.  No upper or lower joint deformation.  Good ROM.  No contractures  . Psychiatric: Unable to fully assess due to impairment . Neurologic: No focal deficits, although not able to follow commands.           Labs on Admission: I have personally reviewed following labs and imaging studies  CBC: Recent Labs  Lab 05/24/2020 1717  WBC 10.5  NEUTROABS 8.2*  HGB 10.6*  HCT 29.7*  MCV 94.0  PLT 409   Basic Metabolic Panel: Recent Labs  Lab 05/05/2020 1717  NA 139  K 3.1*  CL 109  CO2 16*  GLUCOSE 103*  BUN 13  CREATININE 1.37*  CALCIUM 8.5*   GFR: Estimated Creatinine Clearance: 43.1 mL/min (A) (by C-G formula based on SCr of 1.37 mg/dL (H)). Liver Function Tests: Recent Labs  Lab 05/30/2020 1717  AST 104*  ALT 31  ALKPHOS 91  BILITOT 3.8*  PROT 7.3  ALBUMIN 2.5*   Recent Labs  Lab 05/24/2020 1717  LIPASE 33   Recent Labs  Lab 05/02/2020 1717  AMMONIA 128*   Coagulation Profile: Recent Labs  Lab 05/01/2020 1831  INR 1.8*   Cardiac Enzymes: Recent Labs  Lab 05/11/2020 1831  CKTOTAL 646*   BNP (last 3 results) No results for input(s): PROBNP in the last 8760 hours. HbA1C: No results for input(s): HGBA1C in the last 72 hours. CBG: Recent Labs  Lab 05/01/2020 1730  GLUCAP 99   Lipid Profile: No results for input(s): CHOL, HDL, LDLCALC, TRIG, CHOLHDL, LDLDIRECT in the last 72 hours. Thyroid Function Tests: No results for input(s): TSH, T4TOTAL, FREET4, T3FREE, THYROIDAB in the last 72 hours. Anemia Panel: No results for input(s): VITAMINB12, FOLATE, FERRITIN, TIBC, IRON, RETICCTPCT in the last 72 hours. Urine analysis:    Component Value Date/Time   COLORURINE YELLOW 11/24/2011 0606   APPEARANCEUR CLEAR 11/24/2011 0606   LABSPEC 1.005 11/24/2011 0606   PHURINE 6.0 11/24/2011 0606   GLUCOSEU NEGATIVE 11/24/2011 0606   HGBUR LARGE (A) 11/24/2011 0606   BILIRUBINUR  NEGATIVE 11/24/2011 0606   KETONESUR NEGATIVE 11/24/2011 0606   PROTEINUR NEGATIVE 11/24/2011 0606   UROBILINOGEN 0.2 11/24/2011 0606   NITRITE NEGATIVE 11/24/2011 0606   LEUKOCYTESUR NEGATIVE 11/24/2011 0606   Sepsis Labs: @LABRCNTIP (procalcitonin:4,lacticidven:4) ) Recent Results (from the past 240 hour(s))  Resp Panel by RT PCR (RSV, Flu A&B, Covid) - Nasopharyngeal Swab     Status: None   Collection Time: 05/19/2020  4:58 PM   Specimen: Nasopharyngeal Swab  Result Value Ref Range Status   SARS Coronavirus 2 by RT PCR NEGATIVE NEGATIVE Final    Comment: (NOTE) SARS-CoV-2 target nucleic acids are NOT DETECTED.  The SARS-CoV-2 RNA is generally detectable in upper respiratoy specimens during the acute phase of infection. The lowest concentration of SARS-CoV-2 viral copies this assay can detect is 131 copies/mL. A negative result does not preclude SARS-Cov-2 infection and should not  be used as the sole basis for treatment or other patient management decisions. A negative result may occur with  improper specimen collection/handling, submission of specimen other than nasopharyngeal swab, presence of viral mutation(s) within the areas targeted by this assay, and inadequate number of viral copies (<131 copies/mL). A negative result must be combined with clinical observations, patient history, and epidemiological information. The expected result is Negative.  Fact Sheet for Patients:  PinkCheek.be  Fact Sheet for Healthcare Providers:  GravelBags.it  This test is no t yet approved or cleared by the [place] FDA and  has been authorized for detection and/or diagnosis of SARS-CoV-2 by FDA under an Emergency Use Authorization (EUA). This EUA will remain  in effect (meaning this test can be used) for the duration of the COVID-19 declaration under Section 564(b)(1) of the Act, 21 U.S.C. section 360bbb-3(b)(1), unless the  authorization is terminated or revoked sooner.     Influenza A by PCR NEGATIVE NEGATIVE Final   Influenza B by PCR NEGATIVE NEGATIVE Final    Comment: (NOTE) The Xpert Xpress SARS-CoV-2/FLU/RSV assay is intended as an aid in  the diagnosis of influenza from Nasopharyngeal swab specimens and  should not be used as a sole basis for treatment. Nasal washings and  aspirates are unacceptable for Xpert Xpress SARS-CoV-2/FLU/RSV  testing.  Fact Sheet for Patients: PinkCheek.be  Fact Sheet for Healthcare Providers: GravelBags.it  This test is not yet approved or cleared by the [place] FDA and  has been authorized for detection and/or diagnosis of SARS-CoV-2 by  FDA under an Emergency Use Authorization (EUA). This EUA will remain  in effect (meaning this test can be used) for the duration of the  Covid-19 declaration under Section 564(b)(1) of the Act, 21  U.S.C. section 360bbb-3(b)(1), unless the authorization is  terminated or revoked.    Respiratory Syncytial Virus by PCR NEGATIVE NEGATIVE Final    Comment: (NOTE) Fact Sheet for Patients: PinkCheek.be  Fact Sheet for Healthcare Providers: GravelBags.it  This test is not yet approved or cleared by the [place] FDA and  has been authorized for detection and/or diagnosis of SARS-CoV-2 by  FDA under an Emergency Use Authorization (EUA). This EUA will remain  in effect (meaning this test can be used) for the duration of the  COVID-19 declaration under Section 564(b)(1) of the Act, 21 U.S.C.  section 360bbb-3(b)(1), unless the authorization is terminated or  revoked. Performed at [facility], [address], [place], [place] [address]   Blood Culture (routine x 2)     Status: None (Preliminary result)   Collection Time: 05/26/2020  6:32 PM   Specimen: Blood  Result Value Ref Range Status   Specimen Description  RIGHT ANTECUBITAL  Final   Special Requests   Final    BOTTLES DRAWN AEROBIC ONLY Blood Culture adequate volume Performed at [facility], [address] [place], [place] [address]    Culture PENDING  Incomplete   Report Status PENDING  Incomplete  Blood Culture (routine x 2)     Status: None (Preliminary result)   Collection Time: 05/03/2020  6:40 PM   Specimen: Blood  Result Value Ref Range Status   Specimen Description BLOOD RIGHT HAND  Final   Special Requests   Final    BOTTLES DRAWN AEROBIC ONLY Blood Culture adequate volume Performed at [facility], [address] [place], [place] [address]    Culture PENDING  Incomplete   Report Status PENDING  Incomplete     Radiological  Exams on Admission: CT HEAD WO CONTRAST  Result Date: 05/02/2020 CLINICAL DATA:  Delirium, altered mental status after snorting oxycodone and drinking alcohol EXAM: CT HEAD WITHOUT CONTRAST TECHNIQUE: Contiguous axial images were obtained from the base of the skull through the vertex without intravenous contrast. COMPARISON:  CT 04/08/2019 FINDINGS: Motion degradation of the initial images requiring rescanning of the skull base and posterior fossa with satisfactory second acquisition. Brain: No evidence of acute infarction, hemorrhage, hydrocephalus, extra-axial collection, visible mass lesion or mass effect. Symmetric prominence of the ventricles, cisterns and sulci compatible with parenchymal volume loss. Patchy areas of white matter hypoattenuation are most compatible with chronic microvascular angiopathy. Vascular: Atherosclerotic calcification of the carotid siphons. No hyperdense vessel. Skull: Mild left parieto-occipital scalp stranding and thickening (3/63, 2/26). No large hematoma. No subjacent calvarial fracture. No acute or suspicious osseous lesions. Sinuses/Orbits: Mural thickening throughout the ethmoid, sphenoid and maxillary sinuses. Layering air-fluid levels and pneumatized secretions in the frontal  sinuses. Orbital structures are unremarkable aside from prior lens extractions. Other: None. IMPRESSION: 1. No acute intracranial abnormality. 2. Mild left parieto-occipital scalp stranding and thickening without large hematoma. No subjacent calvarial fracture. Correlate for point tenderness/contusive change. 3. Chronic microvascular angiopathy and parenchymal volume loss. 4. Layering air-fluid levels and pneumatized secretions in the frontal sinuses. May correspond with patient's recent substance use versus an acute sinusitis. Electronically Signed   By: Lovena Le M.D.   On: 05/05/2020 19:42   DG Chest Port 1 View  Result Date: 05/02/2020 CLINICAL DATA:  Altered mental status. EXAM: PORTABLE CHEST 1 VIEW COMPARISON:  April 08, 2019 FINDINGS: Cardiomediastinal silhouette is normal. Mediastinal contours appear intact. Calcific atherosclerotic disease of the aorta. There is no evidence of focal airspace consolidation, pleural effusion or pneumothorax. Osseous structures are without acute abnormality. Soft tissues are grossly normal. IMPRESSION: No active disease. Electronically Signed   By: Fidela Salisbury M.D.   On: 05/14/2020 18:58    EKG: Independently reviewed.  Sinus tachycardia with right axis deviation. anteroseptal Q waves.  Assessment/Plan: Principal Problem:   Hepatic encephalopathy (HCC) Active Problems:   GERD (gastroesophageal reflux disease)   Alcoholism /alcohol abuse   Opioid use disorder   Rhabdomyolysis   Lactic acidosis    This patient was discussed with the ED physician, including pertinent vitals, physical exam findings, labs, and imaging.  We also discussed care given by the ED provider.  1. Hepatic encephalopathy a. This appears to be new.  b. Admit to stepdown c. Continue lactulose d. Repeat ammonia level in the morning e. Check RUQ [place] 2. Rhabdomyolysis a. IV fluids at 200 mL/h b. Repeat CK in the morning 3. Lactic acidosis a. No evidence of  infection b. Likely secondary to rhabdo c. Will repeat lactic acid level and BMP 4. Opiate use disorder a.  5. Alcoholism a. Withdrawal protocol b. As the patient is restless, will give the patient Ativan.  May need to advance to Precedex drip 6. GERD a. Protonix  DVT prophylaxis: Elevated INR, will place SCDs Consultants: None Code Status: Full code Family Communication: None Disposition Plan: Patient should be able to return home following   Truett Mainland, DO

## 2020-05-25 NOTE — Progress Notes (Signed)
Pharmacy Antibiotic Note  Douglas Stout is a 64 y.o. male admitted on 05/24/2020 with sepsis.  Pharmacy has been consulted for cefepime and vancomycin dosing.  Plan: Vancomycin 500mg  IV every 12 hours.  Goal trough 15-20 mcg/mL. cefepime 2gm iv q12h  Height: 5\' 8"  (172.7 cm) Weight: 56.7 kg (125 lb) IBW/kg (Calculated) : 68.4  Temp (24hrs), Avg:97.3 F (36.3 C), Min:97.3 F (36.3 C), Max:97.3 F (36.3 C)  Recent Labs  Lab 05/21/2020 1717  WBC 10.5  CREATININE 1.37*  LATICACIDVEN 4.3*    Estimated Creatinine Clearance: 43.1 mL/min (A) (by C-G formula based on SCr of 1.37 mg/dL (H)).    No Known Allergies  Antimicrobials this admission: 9/25 cefepime >>  9/25 vancomycin >>  9/25 metronidazole x 1   Microbiology results: 9/25 BCx: sent  9/25 UCx: sent    Thank you for allowing pharmacy to be a part of this patient's care.  Douglas Stout 05/23/2020 7:08 PM

## 2020-05-25 NOTE — ED Provider Notes (Signed)
[facility] EMERGENCY DEPARTMENT Provider Note   CSN: [id number] Arrival date & time: 05/04/2020  1634     History Chief Complaint  Patient presents with  . Drug Overdose    Douglas Stout is a 64 y.o. male with a past medical history of alcohol abuse, shortness of breath, history of multiple drug overdoses, chronic tobacco abuse presents via EMS with altered mental status. History is gathered by EMS at bedside because the patient is altered and is unable to provide history. There is a level 5 caveat. EMS reports that they were called to the patient's house because the wife wants him to go to rehab she states that he normally crushes his oxycodones and drinks alcohol with it and snorts the oxycodone. She states that he was crushing and snorting those pills yesterday. He apparently laid on a concrete floor all night and today was minimally responsive to the wife who finally called EMS this evening. EMS reports that [employer] gave the patient some Narcan and he seemed to Douglas Stout just a little bit but it did not make a significant difference. She states that he has been fairly obtunded and urinated on himself prior to coming to the ER.  HPI     Past Medical History:  Diagnosis Date  . Alcoholism /alcohol abuse ([place])   . Complication of anesthesia   . Dyspnea   . GERD (gastroesophageal reflux disease)     Patient Active Problem List   Diagnosis Date Noted  . Special screening for malignant neoplasms, colon 10/07/2016    Past Surgical History:  Procedure Laterality Date  . BUNIONECTOMY Right   . COLONOSCOPY WITH PROPOFOL N/A 10/30/2016   Procedure: COLONOSCOPY WITH PROPOFOL;  Surgeon: Rogene Houston, MD;  Location: [facility];  Service: Endoscopy;  Laterality: N/A;  7:30  . POLYPECTOMY  10/30/2016   Procedure: POLYPECTOMY;  Surgeon: Rogene Houston, MD;  Location: [facility];  Service: Endoscopy;;  colon       Family History  Problem Relation Age of Onset  . Cancer Mother   . Cancer  Brother     Social History   Tobacco Use  . Smoking status: Current Every Day Smoker    Packs/day: 2.00    Years: 42.00    Pack years: 84.00    Types: Cigarettes  . Smokeless tobacco: Current User    Types: Chew  Substance Use Topics  . Alcohol use: Yes    Comment: daily  . Drug use: No    Home Medications Prior to Admission medications   Medication Sig Start Date End Date Taking? Authorizing Provider  albuterol (PROVENTIL HFA;VENTOLIN HFA) 108 (90 Base) MCG/ACT inhaler Inhale 1 puff into the lungs every 6 (six) hours as needed for wheezing or shortness of breath.    [provider]  clonazePAM (KLONOPIN) 1 MG tablet Take 1 tablet by mouth daily. 03/23/19   [provider]  oxyCODONE (OXY IR/ROXICODONE) 5 MG immediate release tablet Take 5 mg by mouth 3 (three) times daily.    [provider]  pantoprazole (PROTONIX) 40 MG tablet Take 40 mg by mouth daily.    [provider]  potassium chloride SA (K-DUR,KLOR-CON) 20 MEQ tablet Take 1 tablet (20 mEq total) by mouth daily. 08/29/18   Julianne Rice, MD  sucralfate (CARAFATE) 1 g tablet Take 1 tablet (1 g total) by mouth 4 (four) times daily -  with meals and at bedtime. 08/29/18   Julianne Rice, MD  Allergies    Patient has no known allergies.  Review of Systems   Review of Systems  Unable to perform ROS: Mental status change    Physical Exam Updated Vital Signs Ht 5\' 8"  (1.727 m)   Wt 56.7 kg   BMI 19.01 kg/m   Physical Exam Vitals reviewed.  Constitutional:      General: He is not in acute distress.    Appearance: He is underweight. He is not diaphoretic.  HENT:     Head: Normocephalic.     Nose: Nose normal.     Mouth/Throat:     Mouth: Mucous membranes are moist.  Eyes:     Extraocular Movements: Extraocular movements intact.     Pupils: Pupils are equal, round, and reactive to light.  Cardiovascular:     Rate and Rhythm: Normal rate.     Pulses: Normal pulses.    Pulmonary:     Effort: Pulmonary effort is normal.     Breath sounds: Wheezing and rales present.  Abdominal:     General: There is no distension.     Palpations: Abdomen is soft.     Tenderness: There is no guarding.  Musculoskeletal:        General: No swelling or deformity. Normal range of motion.     Cervical back: Normal range of motion.  Skin:    Comments: Cold extremities  Neurological:     Mental Status: He is lethargic.     GCS: GCS eye subscore is 2. GCS verbal subscore is 2. GCS motor subscore is 5.     Motor: No weakness.     ED Results / Procedures / Treatments   Labs (all labs ordered are listed, but only abnormal results are displayed) Labs Reviewed  RESP PANEL BY RT PCR (RSV, FLU A&B, COVID)  COMPREHENSIVE METABOLIC PANEL  CBC WITH DIFFERENTIAL/PLATELET  URINALYSIS, COMPLETE (UACMP) WITH MICROSCOPIC  AMMONIA  LACTIC ACID, PLASMA  RAPID URINE DRUG SCREEN, HOSP PERFORMED  ETHANOL  LIPASE, BLOOD  CBG MONITORING, ED    EKG EKG Interpretation  Date/Time:  Saturday May 25 2020 17:27:41 EDT Ventricular Rate:  122 PR Interval:    QRS Duration: 101 QT Interval:  369 QTC Calculation: 526 R Axis:   98 Text Interpretation: Sinus tachycardia Right axis deviation Low voltage, extremity and precordial leads Abnormal lateral Q waves Anteroseptal infarct, old Borderline ST depression, diffuse leads Prolonged QT interval Artifact in lead(s) I II III aVR aVL aVF When compared with ECG of 04/08/2019, QT has lengthened Confirmed by Delora Fuel ([id number]) on 05/26/2020 1:26:03 AM   Radiology No results found.  Procedures Procedures (including critical care time)  Medications Ordered in ED Medications  thiamine (B-1) injection 100 mg (has no administration in time range)    ED Course  I have reviewed the triage vital signs and the nursing notes.  Pertinent labs & imaging results that were available during my care of the patient were reviewed by me and  considered in my medical decision making (see chart for details).  Clinical Course as of May 26 1346  Sat May 25, 2020  1757 Potassium(!): 3.1 [AH]  1757 Creatinine(!): 1.37 [AH]  1758 Total Bilirubin(!): 3.8 [AH]    Clinical Course User Index [AH] Margarita Mail, PA-C   MDM Rules/Calculators/A&P                          CC: Patient here with altered mental status VS:  Vitals:  05/26/20 1534 05/26/20 1600 05/26/20 1649 05/26/20 1700  BP: (!) 131/92 115/74  (!) 157/81  Pulse: (!) 54 (!) 102  (!) 110  Resp: 18 (!) 23  (!) 24  Temp: 98.1 F (36.7 C)  (!) 97.4 F (36.3 C)   TempSrc: Axillary  Axillary   SpO2: 98% 98%  100%  Weight:   59.4 kg   Height:   5\' 8"  (1.727 m)     JJ:HERDEYC is gathered by EMS and EMR. Previous records obtained and reviewed. DDX:The patient's complaint of altered mental status involves an extensive number of diagnostic and treatment options, and is a complaint that carries with it a high risk of complications, morbidity, and potential mortality. Given the large differential diagnosis, medical decision making is of high complexity. The differential diagnosis for AMS is extensive and includes, but is not limited to: drug overdose - opioids, alcohol, sedatives, antipsychotics, drug withdrawal, others; Metabolic: hypoxia, hypoglycemia, hyperglycemia, hypercalcemia, hypernatremia, hyponatremia, uremia, hepatic encephalopathy, hypothyroidism, hyperthyroidism, vitamin B12 or thiamine deficiency, carbon monoxide poisoning, Wilson's disease, Lactic acidosis, \DKA/HHOS; Infectious: meningitis, encephalitis, bacteremia/sepsis, urinary tract infection, pneumonia, neurosyphilis; Structural: Space-occupying lesion, (brain tumor, subdural hematoma, hydrocephalus,); Vascular: stroke, subarachnoid hemorrhage, coronary ischemia, hypertensive encephalopathy, CNS vasculitis, thrombotic thrombocytopenic purpura, disseminated intravascular coagulation, hyperviscosity; Psychiatric:  Schizophrenia, depression; Other: Seizure, hypothermia, heat stroke, ICU psychosis, dementia -"sundowning."   Labs: I ordered reviewed and interpreted labs which included CBC without elevated white blood cell count, normocytic anemia, UDS negative, Covid flu and RSV negative, urine without evidence of infection, ethanol level within normal limits. Lipase within normal limits, elevated APTT Tylenol level BMP with hypokalemia at 2.3, mag level within normal limits, ammonia level of 128, elevated CK in the 600s   Imaging: I ordered and reviewed images which included CT head and portable 1 view chest x-ray. I independently visualized and interpreted all imaging. There are no acute, significant findings on today's images. EKG: Sinus tachycardia at a rate of 122 Consults: Dr. Loma Boston will admit the patient MDM: Patient here with altered mental status.  He has hyperammonemia, rhabdo.  Initially covered for potential SIRS sepsis however he does not fit that picture with no elevated lactic acid, normal white cell count and no fever I believe he does not have any infections and believe that his tachycardia, lactic acidosis are all secondary to dehydration.  Patient will be admitted to the hospitalist service for altered mental status.  He was given rectal lactulose with some improvement in his mentation here.  Patient disposition:The patient appears reasonably stabilized for admission considering the current resources, flow, and capabilities available in the ED at this time, and I doubt any other [facility] requiring further screening and/or treatment in the ED prior to admission.       Douglas Stout was evaluated in Emergency Department on 05/26/2020 for the symptoms described in the history of present illness. He was evaluated in the context of the global COVID-19 pandemic, which necessitated consideration that the patient might be at risk for infection with the SARS-CoV-2 virus that causes COVID-19.  Institutional protocols and algorithms that pertain to the evaluation of patients at risk for COVID-19 are in a state of rapid change based on information released by regulatory bodies including the CDC and federal and state organizations. These policies and algorithms were followed during the patient's care in the ED.  Final Clinical Impression(s) / ED Diagnoses Final diagnoses:  None    Rx / DC Orders ED Discharge Orders    None  Margarita Mail, PA-C 05/26/20 2203    Davonna Belling, MD 05/26/20 2337

## 2020-05-26 ENCOUNTER — Inpatient Hospital Stay (HOSPITAL_COMMUNITY): Payer: Medicare HMO

## 2020-05-26 DIAGNOSIS — K7290 Hepatic failure, unspecified without coma: Secondary | ICD-10-CM | POA: Diagnosis not present

## 2020-05-26 LAB — COMPREHENSIVE METABOLIC PANEL
ALT: 26 U/L (ref 0–44)
AST: 84 U/L — ABNORMAL HIGH (ref 15–41)
Albumin: 2.1 g/dL — ABNORMAL LOW (ref 3.5–5.0)
Alkaline Phosphatase: 73 U/L (ref 38–126)
Anion gap: 9 (ref 5–15)
BUN: 12 mg/dL (ref 8–23)
CO2: 16 mmol/L — ABNORMAL LOW (ref 22–32)
Calcium: 7.6 mg/dL — ABNORMAL LOW (ref 8.9–10.3)
Chloride: 114 mmol/L — ABNORMAL HIGH (ref 98–111)
Creatinine, Ser: 1.13 mg/dL (ref 0.61–1.24)
GFR calc Af Amer: >60 mL/min (ref >60)
GFR calc non Af Amer: >60 mL/min (ref >60)
Glucose, Bld: 91 mg/dL (ref 70–99)
Potassium: 2.3 mmol/L — CL (ref 3.5–5.1)
Sodium: 139 mmol/L (ref 135–145)
Total Bilirubin: 2.7 mg/dL — ABNORMAL HIGH (ref 0.3–1.2)
Total Protein: 6.2 g/dL — ABNORMAL LOW (ref 6.5–8.1)

## 2020-05-26 LAB — URINALYSIS, COMPLETE (UACMP) WITH MICROSCOPIC
Bilirubin Urine: NEGATIVE
Glucose, UA: NEGATIVE mg/dL
Hgb urine dipstick: NEGATIVE
Ketones, ur: 5 mg/dL — AB
Leukocytes,Ua: NEGATIVE
Nitrite: NEGATIVE
Protein, ur: NEGATIVE mg/dL
Specific Gravity, Urine: 1.01 (ref 1.005–1.030)
pH: 7 (ref 5.0–8.0)

## 2020-05-26 LAB — MAGNESIUM: Magnesium: 2.1 mg/dL (ref 1.7–2.4)

## 2020-05-26 LAB — BASIC METABOLIC PANEL
Anion gap: 11 (ref 5–15)
BUN: 12 mg/dL (ref 8–23)
CO2: 13 mmol/L — ABNORMAL LOW (ref 22–32)
Calcium: 7.6 mg/dL — ABNORMAL LOW (ref 8.9–10.3)
Chloride: 112 mmol/L — ABNORMAL HIGH (ref 98–111)
Creatinine, Ser: 1.01 mg/dL (ref 0.61–1.24)
GFR calc Af Amer: >60 mL/min (ref >60)
GFR calc non Af Amer: >60 mL/min (ref >60)
Glucose, Bld: 90 mg/dL (ref 70–99)
Potassium: 2.3 mmol/L — CL (ref 3.5–5.1)
Sodium: 136 mmol/L (ref 135–145)

## 2020-05-26 LAB — CBC
HCT: 30.2 % — ABNORMAL LOW (ref 39.0–52.0)
Hemoglobin: 10.6 g/dL — ABNORMAL LOW (ref 13.0–17.0)
MCH: 33.2 pg (ref 26.0–34.0)
MCHC: 35.1 g/dL (ref 30.0–36.0)
MCV: 94.7 fL (ref 80.0–100.0)
Platelets: 175 10*3/uL (ref 150–400)
RBC: 3.19 MIL/uL — ABNORMAL LOW (ref 4.22–5.81)
RDW: 16.1 % — ABNORMAL HIGH (ref 11.5–15.5)
WBC: 12.6 10*3/uL — ABNORMAL HIGH (ref 4.0–10.5)
nRBC: 0 % (ref 0.0–0.2)

## 2020-05-26 LAB — RAPID URINE DRUG SCREEN, HOSP PERFORMED
Amphetamines: NOT DETECTED
Barbiturates: NOT DETECTED
Benzodiazepines: NOT DETECTED
Cocaine: NOT DETECTED
Opiates: NOT DETECTED
Tetrahydrocannabinol: NOT DETECTED

## 2020-05-26 LAB — PROTIME-INR
INR: 2 — ABNORMAL HIGH (ref 0.8–1.2)
Prothrombin Time: 21.7 seconds — ABNORMAL HIGH (ref 11.4–15.2)

## 2020-05-26 LAB — MRSA PCR SCREENING: MRSA by PCR: NEGATIVE

## 2020-05-26 LAB — CK: Total CK: 562 U/L — ABNORMAL HIGH (ref 49–397)

## 2020-05-26 LAB — AMMONIA: Ammonia: 40 umol/L — ABNORMAL HIGH (ref 9–35)

## 2020-05-26 LAB — HIV ANTIBODY (ROUTINE TESTING W REFLEX): HIV Screen 4th Generation wRfx: NONREACTIVE

## 2020-05-26 MED ORDER — POTASSIUM CHLORIDE 10 MEQ/100ML IV SOLN
10.0000 meq | INTRAVENOUS | Status: AC
  Administered 2020-05-26 (×6): 10 meq via INTRAVENOUS
  Filled 2020-05-26 (×3): qty 100

## 2020-05-26 MED ORDER — POTASSIUM CHLORIDE 10 MEQ/100ML IV SOLN
10.0000 meq | INTRAVENOUS | Status: AC
  Administered 2020-05-26 (×6): 10 meq via INTRAVENOUS
  Filled 2020-05-26 (×6): qty 100

## 2020-05-26 MED ORDER — LABETALOL HCL 5 MG/ML IV SOLN: 10.0000 mg | INTRAVENOUS | Status: DC | PRN

## 2020-05-26 MED ORDER — CHLORHEXIDINE GLUCONATE CLOTH 2 % EX PADS
6.0000 | MEDICATED_PAD | Freq: Every day | CUTANEOUS | Status: DC
  Administered 2020-05-26 – 2020-05-30 (×5): 6 via TOPICAL

## 2020-05-26 MED ORDER — MAGNESIUM SULFATE 2 GM/50ML IV SOLN
2.0000 g | Freq: Once | INTRAVENOUS | Status: AC
  Administered 2020-05-26: 2 g via INTRAVENOUS
  Filled 2020-05-26: qty 50

## 2020-05-26 NOTE — ED Notes (Signed)
Date and time results received: 05/26/20 0712 (use smartphrase ".now" to insert current time)  Test: potassium Critical Value: 2.3  Name of Provider Notified: Manuella Ghazi MD  Orders Received? Or Actions Taken?: n/a

## 2020-05-26 NOTE — ED Notes (Addendum)
I assumed care of patient at 1100. Patient with mittens in place. Restless, agitated at times. Incomprehensible speech noted, patient will open eyes to verbal stimuli, but non-purposeful in responses/movements at this time. Recently given Ativan for agitation/CIWA. Restarted potassium infusions per orders. Brother at bedside. Will continue to monitor.

## 2020-05-26 NOTE — ED Notes (Signed)
CRITICAL VALUE ALERT  Critical Value:  Potassium 2.3  Date & Time Notied:  05/26/20 1547 Provider Notified: Dr Manuella Ghazi  Orders Received/Actions taken: No orders received

## 2020-05-26 NOTE — Progress Notes (Signed)
Patient resting at this time, easily response to voice. VSS. Respirations even and unlabored.

## 2020-05-26 NOTE — Progress Notes (Signed)
Patient remains somnolent due to medications. Rectal Laculose unavailable at this time per pharmacy. They will deliver medication to unit when available. MD notified.

## 2020-05-26 NOTE — TOC Initial Note (Signed)
Transition of Care [facility]) - Initial/Assessment Note    Patient Details  Name: Douglas Stout MRN: [id number] Date of Birth: 01-Jan-1956  Transition of Care [facility] CM/SW Contact:    Natasha Bence, LCSW Phone Number: 05/26/2020, 10:24 AM  Clinical Narrative:                 Patient is a 64 year old male admitted for Hepatic encephalopathy. CSW attempted SA consult. Patient not alert. Patient's brother at bedside. Patient's brother reported that when patient was alert, patient was not oriented. TOC to follow.        Patient Goals and CMS Choice        Expected Discharge Plan and Services                                                Prior Living Arrangements/Services                       Activities of Daily Living      Permission Sought/Granted                  Emotional Assessment              Admission diagnosis:  Hepatic encephalopathy ([place]) [K72.90] Patient Active Problem List   Diagnosis Date Noted  . Hepatic encephalopathy ([place]) 05/22/2020  . Opioid use disorder 05/03/2020  . Rhabdomyolysis 05/18/2020  . Lactic acidosis 05/03/2020  . GERD (gastroesophageal reflux disease)   . Alcoholism /alcohol abuse    PCP:  Lucia Gaskins, MD Pharmacy:   [facility] [place] [address] Phone: [phone] Fax: [phone]     Social Determinants of Health (SDOH) Interventions    Readmission Risk Interventions No flowsheet data found.

## 2020-05-26 NOTE — Progress Notes (Signed)
PROGRESS NOTE    Douglas Stout  ZHY:[id number] DOB: November 22, 1955 DOA: 05/08/2020 PCP: Lucia Gaskins, MD   Brief Narrative:  Per HPI: Douglas Stout is a 64 y.o. male with a history of hypertension, alcoholism, GERD.  Patient is hypersomnolent so HPI obtained from EDP and medical records.  Patient brought to the emergency department due to hypersomnolence.  By report, the patient is alcoholic and crushes his narcotics and takes them intranasally.  He and her friend were doing this last night.  The wife found him on the floor this morning.  He remained on the floor for the majority of the day and he remained unresponsive.  His wife finally called EMS around 4 PM.  Patient found to have elevated ammonia level, elevated CK.  Imaging normal.  Patient started IV fluids and given a lactulose rectal enema.  9/26: Patient was admitted with acute multifactorial encephalopathy with noted elevated ammonia levels in the setting of polysubstance abuse with alcohol as well as narcotics.  He is also noted to have rhabdomyolysis.  He continues to remain encephalopathic this morning.  Assessment & Plan:   Principal Problem:   Hepatic encephalopathy (HCC) Active Problems:   GERD (gastroesophageal reflux disease)   Alcoholism /alcohol abuse   Opioid use disorder   Rhabdomyolysis   Lactic acidosis   Acute encephalopathy-multifactorial -Appears to be toxic in relation to substance abuse as well as hepatic in nature with elevated ammonia levels -Ammonia levels have down trended since admission, continue lactulose for now and monitor -Right upper quadrant ultrasound with cirrhosis and moderate ascites -Noted elevated INR -Keep n.p.o. with SLP evaluation ordered and pending -CT head with no acute findings and some left-sided scalp trauma, but without hematoma. -EtOH negative -UDS negative  Severe hypokalemia/hypomagnesemia -Replete IV for both -Repeat labs this afternoon and  follow-up  Rhabdomyolysis-nontraumatic -Continue aggressive IV fluid -Repeat CK level decreasing, will monitor -No sign of significant AKI  Lactic acidosis -Resolved with IV fluid-continue -No evidence of infection and likely secondary to rhabdomyolysis -Continue to monitor  Opiate use disorder -Patient will require substance abuse counseling  Alcoholism -Maintain on alcohol withdrawal protocol  GERD -Continue on Protonix IV daily  DVT prophylaxis: SCDs Code Status: None Family Communication:  Disposition Plan:  Status is: Inpatient  Remains inpatient appropriate because:Altered mental status, IV treatments appropriate due to intensity of illness or inability to take PO and Inpatient level of care appropriate due to severity of illness   Dispo: The patient is from: Home              Anticipated d/c is to: SNF              Anticipated d/c date is: 3 days              Patient currently is not medically stable to d/c.  Patient continues to remain quite encephalopathic and is not stable for discharge.   Consultants:   None  Procedures:   See below  Antimicrobials:   None   Subjective: Patient seen and evaluated today and is quite altered and confused.  He is not responsive to any questioning and mumbles infrequently.  Objective: Vitals:   05/26/20 0800 05/26/20 0830 05/26/20 0900 05/26/20 0930  BP: 126/67 123/64 131/68 (!) 108/92  Pulse: (!) 104 (!) 105 (!) 105 (!) 108  Resp: 14 (!) 29  (!) 22  Temp:      TempSrc:      SpO2: 100% 100% 100% 100%  Weight:  Height:        Intake/Output Summary (Last 24 hours) at 05/26/2020 1035 Last data filed at 05/26/2020 0128 Gross per 24 hour  Intake 485.7 ml  Output --  Net 485.7 ml   Filed Weights   05/13/2020 1650  Weight: 56.7 kg    Examination:  General exam: Appears lethargic/somnolent Respiratory system: Clear to auscultation. Respiratory effort normal. Cardiovascular system: S1 & S2 heard, RRR.   Gastrointestinal system: Abdomen is nondistended, soft and nontender.  Central nervous system: Somnolent Extremities: No edema Skin: No rashes, lesions or ulcers, tattoos throughout Psychiatry: Cannot be assessed.    Data Reviewed: I have personally reviewed following labs and imaging studies  CBC: Recent Labs  Lab 05/20/2020 1717 05/03/2020 2230 05/26/20 0915  WBC 10.5 10.5 12.6*  NEUTROABS 8.2*  --   --   HGB 10.6* 9.3* 10.6*  HCT 29.7* 26.3* 30.2*  MCV 94.0 94.6 94.7  PLT 183 147* 097   Basic Metabolic Panel: Recent Labs  Lab 05/17/2020 1717 05/27/2020 2230 05/26/20 0534  NA 139 136 139  K 3.1* 2.1* 2.3*  CL 109 113* 114*  CO2 16* 13* 16*  GLUCOSE 103* 113* 91  BUN 13 12 12   CREATININE 1.37* 1.13 1.13  CALCIUM 8.5* 7.4* 7.6*  MG  --  1.4*  --   PHOS  --  2.0*  --    GFR: Estimated Creatinine Clearance: 52.3 mL/min (by C-G formula based on SCr of 1.13 mg/dL). Liver Function Tests: Recent Labs  Lab 05/07/2020 1717 05/26/20 0534  AST 104* 84*  ALT 31 26  ALKPHOS 91 73  BILITOT 3.8* 2.7*  PROT 7.3 6.2*  ALBUMIN 2.5* 2.1*   Recent Labs  Lab 05/02/2020 1717  LIPASE 33   Recent Labs  Lab 05/17/2020 1717 05/26/20 0535  AMMONIA 128* 40*   Coagulation Profile: Recent Labs  Lab 05/21/2020 1831 05/26/20 0534  INR 1.8* 2.0*   Cardiac Enzymes: Recent Labs  Lab 05/08/2020 1831 05/26/20 0534  CKTOTAL 646* 562*   BNP (last 3 results) No results for input(s): PROBNP in the last 8760 hours. HbA1C: No results for input(s): HGBA1C in the last 72 hours. CBG: Recent Labs  Lab 05/04/2020 1730  GLUCAP 99   Lipid Profile: No results for input(s): CHOL, HDL, LDLCALC, TRIG, CHOLHDL, LDLDIRECT in the last 72 hours. Thyroid Function Tests: No results for input(s): TSH, T4TOTAL, FREET4, T3FREE, THYROIDAB in the last 72 hours. Anemia Panel: No results for input(s): VITAMINB12, FOLATE, FERRITIN, TIBC, IRON, RETICCTPCT in the last 72 hours. Sepsis Labs: Recent Labs   Lab 05/24/2020 1717 05/07/2020 2230  LATICACIDVEN 4.3* 1.5    Recent Results (from the past 240 hour(s))  Resp Panel by RT PCR (RSV, Flu A&B, Covid) - Nasopharyngeal Swab     Status: None   Collection Time: 05/12/2020  4:58 PM   Specimen: Nasopharyngeal Swab  Result Value Ref Range Status   SARS Coronavirus 2 by RT PCR NEGATIVE NEGATIVE Final    Comment: (NOTE) SARS-CoV-2 target nucleic acids are NOT DETECTED.  The SARS-CoV-2 RNA is generally detectable in upper respiratoy specimens during the acute phase of infection. The lowest concentration of SARS-CoV-2 viral copies this assay can detect is 131 copies/mL. A negative result does not preclude SARS-Cov-2 infection and should not be used as the sole basis for treatment or other patient management decisions. A negative result may occur with  improper specimen collection/handling, submission of specimen other than nasopharyngeal swab, presence of viral mutation(s) within the areas  targeted by this assay, and inadequate number of viral copies (<131 copies/mL). A negative result must be combined with clinical observations, patient history, and epidemiological information. The expected result is Negative.  Fact Sheet for Patients:  PinkCheek.be  Fact Sheet for Healthcare Providers:  GravelBags.it  This test is no t yet approved or cleared by the [place] FDA and  has been authorized for detection and/or diagnosis of SARS-CoV-2 by FDA under an Emergency Use Authorization (EUA). This EUA will remain  in effect (meaning this test can be used) for the duration of the COVID-19 declaration under Section 564(b)(1) of the Act, 21 U.S.C. section 360bbb-3(b)(1), unless the authorization is terminated or revoked sooner.     Influenza A by PCR NEGATIVE NEGATIVE Final   Influenza B by PCR NEGATIVE NEGATIVE Final    Comment: (NOTE) The Xpert Xpress SARS-CoV-2/FLU/RSV assay is  intended as an aid in  the diagnosis of influenza from Nasopharyngeal swab specimens and  should not be used as a sole basis for treatment. Nasal washings and  aspirates are unacceptable for Xpert Xpress SARS-CoV-2/FLU/RSV  testing.  Fact Sheet for Patients: PinkCheek.be  Fact Sheet for Healthcare Providers: GravelBags.it  This test is not yet approved or cleared by the [place] FDA and  has been authorized for detection and/or diagnosis of SARS-CoV-2 by  FDA under an Emergency Use Authorization (EUA). This EUA will remain  in effect (meaning this test can be used) for the duration of the  Covid-19 declaration under Section 564(b)(1) of the Act, 21  U.S.C. section 360bbb-3(b)(1), unless the authorization is  terminated or revoked.    Respiratory Syncytial Virus by PCR NEGATIVE NEGATIVE Final    Comment: (NOTE) Fact Sheet for Patients: PinkCheek.be  Fact Sheet for Healthcare Providers: GravelBags.it  This test is not yet approved or cleared by the [place] FDA and  has been authorized for detection and/or diagnosis of SARS-CoV-2 by  FDA under an Emergency Use Authorization (EUA). This EUA will remain  in effect (meaning this test can be used) for the duration of the  COVID-19 declaration under Section 564(b)(1) of the Act, 21 U.S.C.  section 360bbb-3(b)(1), unless the authorization is terminated or  revoked. Performed at [facility], [address], [place], [place] [address]   Blood Culture (routine x 2)     Status: None (Preliminary result)   Collection Time: 05/19/2020  6:32 PM   Specimen: Blood  Result Value Ref Range Status   Specimen Description RIGHT ANTECUBITAL  Final   Special Requests   Final    BOTTLES DRAWN AEROBIC ONLY Blood Culture adequate volume Performed at [facility], [address], [place], [place] [address]    Culture PENDING   Incomplete   Report Status PENDING  Incomplete  Blood Culture (routine x 2)     Status: None (Preliminary result)   Collection Time: 05/20/2020  6:40 PM   Specimen: Blood  Result Value Ref Range Status   Specimen Description BLOOD RIGHT HAND  Final   Special Requests   Final    BOTTLES DRAWN AEROBIC ONLY Blood Culture adequate volume Performed at [facility], [address] [place],  [address]    Culture PENDING  Incomplete   Report Status PENDING  Incomplete         Radiology Studies: CT HEAD WO CONTRAST  Result Date: 05/06/2020 CLINICAL DATA:  Delirium, altered mental status after snorting oxycodone and drinking alcohol EXAM: CT HEAD WITHOUT CONTRAST TECHNIQUE: Contiguous axial images were obtained  from the base of the skull through the vertex without intravenous contrast. COMPARISON:  CT 04/08/2019 FINDINGS: Motion degradation of the initial images requiring rescanning of the skull base and posterior fossa with satisfactory second acquisition. Brain: No evidence of acute infarction, hemorrhage, hydrocephalus, extra-axial collection, visible mass lesion or mass effect. Symmetric prominence of the ventricles, cisterns and sulci compatible with parenchymal volume loss. Patchy areas of white matter hypoattenuation are most compatible with chronic microvascular angiopathy. Vascular: Atherosclerotic calcification of the carotid siphons. No hyperdense vessel. Skull: Mild left parieto-occipital scalp stranding and thickening (3/63, 2/26). No large hematoma. No subjacent calvarial fracture. No acute or suspicious osseous lesions. Sinuses/Orbits: Mural thickening throughout the ethmoid, sphenoid and maxillary sinuses. Layering air-fluid levels and pneumatized secretions in the frontal sinuses. Orbital structures are unremarkable aside from prior lens extractions. Other: None. IMPRESSION: 1. No acute intracranial abnormality. 2. Mild left parieto-occipital scalp stranding and thickening without  large hematoma. No subjacent calvarial fracture. Correlate for point tenderness/contusive change. 3. Chronic microvascular angiopathy and parenchymal volume loss. 4. Layering air-fluid levels and pneumatized secretions in the frontal sinuses. May correspond with patient's recent substance use versus an acute sinusitis. Electronically Signed   By: Lovena Le M.D.   On: 05/28/2020 19:42   DG Chest Port 1 View  Result Date: 05/12/2020 CLINICAL DATA:  Altered mental status. EXAM: PORTABLE CHEST 1 VIEW COMPARISON:  April 08, 2019 FINDINGS: Cardiomediastinal silhouette is normal. Mediastinal contours appear intact. Calcific atherosclerotic disease of the aorta. There is no evidence of focal airspace consolidation, pleural effusion or pneumothorax. Osseous structures are without acute abnormality. Soft tissues are grossly normal. IMPRESSION: No active disease. Electronically Signed   By: Fidela Salisbury M.D.   On: 05/18/2020 18:58        Scheduled Meds: . folic acid  1 mg Oral Daily  . lactulose  30 g Oral BID  . LORazepam  0-4 mg Intravenous Q4H   Followed by  . [START ON 05/27/2020] LORazepam  0-4 mg Intravenous Q8H  . multivitamin with minerals  1 tablet Oral Daily  . pantoprazole (PROTONIX) IV  40 mg Intravenous QHS  . thiamine  100 mg Oral Daily   Or  . thiamine  100 mg Intravenous Daily   Continuous Infusions: . sodium chloride 200 mL/hr at 05/26/20 0938  . magnesium sulfate bolus IVPB 2 g (05/26/20 0935)  . potassium chloride 10 mEq (05/26/20 0939)     LOS: 1 day    Time spent: 35 minutes    Aarav Burgett D Manuella Ghazi, DO [facility]  If 7PM-7AM, please contact night-coverage [email or web address] 05/26/2020, 10:35 AM

## 2020-05-27 ENCOUNTER — Inpatient Hospital Stay (HOSPITAL_COMMUNITY)
Admit: 2020-05-27 | Discharge: 2020-05-27 | Disposition: A | Discharge status: Home / Self Care | Payer: Medicare HMO | Attending: Internal Medicine | Admitting: Internal Medicine

## 2020-05-27 DIAGNOSIS — K7290 Hepatic failure, unspecified without coma: Secondary | ICD-10-CM | POA: Diagnosis not present

## 2020-05-27 DIAGNOSIS — G9340 Encephalopathy, unspecified: Secondary | ICD-10-CM

## 2020-05-27 LAB — BASIC METABOLIC PANEL
Anion gap: 12 (ref 5–15)
Anion gap: 10 (ref 5–15)
BUN: 11 mg/dL (ref 8–23)
BUN: 11 mg/dL (ref 8–23)
CO2: 13 mmol/L — ABNORMAL LOW (ref 22–32)
CO2: 11 mmol/L — ABNORMAL LOW (ref 22–32)
Calcium: 7.4 mg/dL — ABNORMAL LOW (ref 8.9–10.3)
Calcium: 7.5 mg/dL — ABNORMAL LOW (ref 8.9–10.3)
Chloride: 117 mmol/L — ABNORMAL HIGH (ref 98–111)
Chloride: 121 mmol/L — ABNORMAL HIGH (ref 98–111)
Creatinine, Ser: 1.05 mg/dL (ref 0.61–1.24)
Creatinine, Ser: 1.07 mg/dL (ref 0.61–1.24)
GFR calc Af Amer: >60 mL/min (ref >60)
GFR calc Af Amer: >60 mL/min (ref >60)
GFR calc non Af Amer: >60 mL/min (ref >60)
GFR calc non Af Amer: >60 mL/min (ref >60)
Glucose, Bld: 99 mg/dL (ref 70–99)
Glucose, Bld: 110 mg/dL — ABNORMAL HIGH (ref 70–99)
Potassium: 2.5 mmol/L — CL (ref 3.5–5.1)
Potassium: 3.2 mmol/L — ABNORMAL LOW (ref 3.5–5.1)
Sodium: 142 mmol/L (ref 135–145)
Sodium: 142 mmol/L (ref 135–145)

## 2020-05-27 LAB — CBC
HCT: 27.6 % — ABNORMAL LOW (ref 39.0–52.0)
Hemoglobin: 9.6 g/dL — ABNORMAL LOW (ref 13.0–17.0)
MCH: 33.1 pg (ref 26.0–34.0)
MCHC: 34.8 g/dL (ref 30.0–36.0)
MCV: 95.2 fL (ref 80.0–100.0)
Platelets: 168 10*3/uL (ref 150–400)
RBC: 2.9 MIL/uL — ABNORMAL LOW (ref 4.22–5.81)
RDW: 16.6 % — ABNORMAL HIGH (ref 11.5–15.5)
WBC: 10.9 10*3/uL — ABNORMAL HIGH (ref 4.0–10.5)
nRBC: 0 % (ref 0.0–0.2)

## 2020-05-27 LAB — CK: Total CK: 258 U/L (ref 49–397)

## 2020-05-27 LAB — URINE CULTURE: Culture: NO GROWTH

## 2020-05-27 LAB — MAGNESIUM: Magnesium: 1.7 mg/dL (ref 1.7–2.4)

## 2020-05-27 MED ORDER — POTASSIUM CHLORIDE 10 MEQ/100ML IV SOLN
10.0000 meq | INTRAVENOUS | Status: AC
  Administered 2020-05-27 (×6): 10 meq via INTRAVENOUS
  Filled 2020-05-27 (×3): qty 100

## 2020-05-27 NOTE — Progress Notes (Signed)
SLP Cancellation Note  Patient Details Name: Douglas Stout MRN: [id number] DOB: 1956-08-22   Cancelled treatment:       Reason Eval/Treat Not Completed: Fatigue/lethargy limiting ability to participate;Patient at procedure or test/unavailable; Pt was inappropriate (lethargy) this AM and again this afternoon (lethargy and also getting EEG). SLP will check back tomorrow. Recommend oral care.  Thank you,  Genene Churn, [place]    [place] 05/27/2020, 3:53 PM

## 2020-05-27 NOTE — Progress Notes (Signed)
PROGRESS NOTE    Douglas Stout  AYT:[id number] DOB: 1955/11/08 DOA: 05/18/2020 PCP: Lucia Gaskins, MD   Brief Narrative:  Per HPI: Douglas Stout a 64 y.o.malewith a history of hypertension, alcoholism, GERD. Patient is hypersomnolent so HPI obtained from EDP and medical records. Patient brought to the emergency department due to hypersomnolence. By report, the patient is alcoholic and crushes his narcotics and takes them intranasally. He and her friend were doing this last night. The wife found him on the floor this morning. He remained on the floor for the majority of the day and he remained unresponsive. His wife finally called EMS around 4 PM. Patient found to have elevated ammonia level, elevated CK. Imaging normal. Patient started IV fluids and given a lactulose rectal enema.  9/26: Patient was admitted with acute multifactorial encephalopathy with noted elevated ammonia levels in the setting of polysubstance abuse with alcohol as well as narcotics.  He is also noted to have rhabdomyolysis.  He continues to remain encephalopathic this morning.  9/27: Patient still continues to remain quite encephalopathic this morning.  This is quite unusual and therefore EEG will be obtained with concern for possible alcohol withdrawal seizures.  May need to consider brain MRI if no significant findings noted.  Continue potassium supplementation as he is still quite hypokalemic.  Assessment & Plan:   Principal Problem:   Hepatic encephalopathy (HCC) Active Problems:   GERD (gastroesophageal reflux disease)   Alcoholism /alcohol abuse   Opioid use disorder   Rhabdomyolysis   Lactic acidosis   Acute encephalopathy-multifactorial -Appears to be toxic in relation to substance abuse as well as hepatic in nature with elevated ammonia levels -Ammonia levels have down trended since admission, continue lactulose for now and monitor -Right upper quadrant ultrasound with cirrhosis and  moderate ascites -Noted elevated INR -Keep n.p.o. with SLP evaluation ordered and pending -CT head with no acute findings and some left-sided scalp trauma, but without hematoma. -EtOH negative -UDS negative -EEG ordered 9/27  Severe hypokalemia -IV repletion ongoing -Repeat labs this afternoon and follow-up  Rhabdomyolysis-nontraumatic-improving -Continue aggressive IV fluid -Repeat CK level decreasing, will monitor -No sign of significant AKI  Lactic acidosis -Resolved with IV fluid-continue -No evidence of infection and likely secondary to rhabdomyolysis -Continue to monitor  Opiate use disorder -Patient will require substance abuse counseling -UDS negative  Alcoholism -Maintain on alcohol withdrawal protocol  GERD -Continue on Protonix IV daily  DVT prophylaxis: SCDs Code Status: None Family Communication:  Discussed with brother 9/26. Disposition Plan:  Status is: Inpatient  Remains inpatient appropriate because:Altered mental status, IV treatments appropriate due to intensity of illness or inability to take PO and Inpatient level of care appropriate due to severity of illness   Dispo: The patient is from: Home  Anticipated d/c is to: SNF  Anticipated d/c date is: 3 days  Patient currently is not medically stable to d/c.  Patient continues to remain quite encephalopathic and is not stable for discharge.  EEG ordered for 9/27.   Consultants:   None  Procedures:   See below  Antimicrobials:   None   Subjective: Patient seen and evaluated today with ongoing unresponsiveness and minimal agitation noted overnight for which he required some Ativan.  He continues to remain severely hypokalemic.  Objective: Vitals:   05/27/20 0700 05/27/20 0800 05/27/20 0900 05/27/20 1000  BP: 128/68 (!) 93/55 109/65 137/72  Pulse: 100 100 (!) 103 (!) 108  Resp: 17 17 18  (!) 24  Temp:  TempSrc:      SpO2: 100%  100% 100% 100%  Weight:      Height:        Intake/Output Summary (Last 24 hours) at 05/27/2020 1136 Last data filed at 05/26/2020 1536 Gross per 24 hour  Intake --  Output 200 ml  Net -200 ml   Filed Weights   05/05/2020 1650 05/26/20 1649 05/27/20 0500  Weight: 56.7 kg 59.4 kg 60.7 kg    Examination:  General exam: Somnolent Respiratory system: Clear to auscultation. Respiratory effort normal. Cardiovascular system: S1 & S2 heard, RRR. Gastrointestinal system: Abdomen is nondistended, soft and nontender.  Central nervous system: Somnolent is mostly unresponsive Extremities: No edema Skin: No rashes, lesions or ulcers Psychiatry: Cannot be assessed    Data Reviewed: I have personally reviewed following labs and imaging studies  CBC: Recent Labs  Lab 05/20/2020 1717 05/01/2020 2230 05/26/20 0915 05/27/20 0707  WBC 10.5 10.5 12.6* 10.9*  NEUTROABS 8.2*  --   --   --   HGB 10.6* 9.3* 10.6* 9.6*  HCT 29.7* 26.3* 30.2* 27.6*  MCV 94.0 94.6 94.7 95.2  PLT 183 147* 175 384   Basic Metabolic Panel: Recent Labs  Lab 05/19/2020 1717 05/04/2020 2230 05/26/20 0534 05/26/20 1435 05/27/20 0707  NA 139 136 139 136 142  K 3.1* 2.1* 2.3* 2.3* 2.5*  CL 109 113* 114* 112* 117*  CO2 16* 13* 16* 13* 13*  GLUCOSE 103* 113* 91 90 99  BUN 13 12 12 12 11   CREATININE 1.37* 1.13 1.13 1.01 1.05  CALCIUM 8.5* 7.4* 7.6* 7.6* 7.4*  MG  --  1.4*  --  2.1 1.7  PHOS  --  2.0*  --   --   --    GFR: Estimated Creatinine Clearance: 60.2 mL/min (by C-G formula based on SCr of 1.05 mg/dL). Liver Function Tests: Recent Labs  Lab 05/10/2020 1717 05/26/20 0534  AST 104* 84*  ALT 31 26  ALKPHOS 91 73  BILITOT 3.8* 2.7*  PROT 7.3 6.2*  ALBUMIN 2.5* 2.1*   Recent Labs  Lab 05/02/2020 1717  LIPASE 33   Recent Labs  Lab 05/08/2020 1717 05/26/20 0535  AMMONIA 128* 40*   Coagulation Profile: Recent Labs  Lab 05/15/2020 1831 05/26/20 0534  INR 1.8* 2.0*   Cardiac Enzymes: Recent Labs   Lab 05/01/2020 1831 05/26/20 0534 05/27/20 0707  CKTOTAL 646* 562* 258   BNP (last 3 results) No results for input(s): PROBNP in the last 8760 hours. HbA1C: No results for input(s): HGBA1C in the last 72 hours. CBG: Recent Labs  Lab 05/03/2020 1730  GLUCAP 99   Lipid Profile: No results for input(s): CHOL, HDL, LDLCALC, TRIG, CHOLHDL, LDLDIRECT in the last 72 hours. Thyroid Function Tests: No results for input(s): TSH, T4TOTAL, FREET4, T3FREE, THYROIDAB in the last 72 hours. Anemia Panel: No results for input(s): VITAMINB12, FOLATE, FERRITIN, TIBC, IRON, RETICCTPCT in the last 72 hours. Sepsis Labs: Recent Labs  Lab 05/04/2020 1717 05/29/2020 2230  LATICACIDVEN 4.3* 1.5    Recent Results (from the past 240 hour(s))  Resp Panel by RT PCR (RSV, Flu A&B, Covid) - Nasopharyngeal Swab     Status: None   Collection Time: 05/24/2020  4:58 PM   Specimen: Nasopharyngeal Swab  Result Value Ref Range Status   SARS Coronavirus 2 by RT PCR NEGATIVE NEGATIVE Final    Comment: (NOTE) SARS-CoV-2 target nucleic acids are NOT DETECTED.  The SARS-CoV-2 RNA is generally detectable in upper respiratoy specimens during the  acute phase of infection. The lowest concentration of SARS-CoV-2 viral copies this assay can detect is 131 copies/mL. A negative result does not preclude SARS-Cov-2 infection and should not be used as the sole basis for treatment or other patient management decisions. A negative result may occur with  improper specimen collection/handling, submission of specimen other than nasopharyngeal swab, presence of viral mutation(s) within the areas targeted by this assay, and inadequate number of viral copies (<131 copies/mL). A negative result must be combined with clinical observations, patient history, and epidemiological information. The expected result is Negative.  Fact Sheet for Patients:  PinkCheek.be  Fact Sheet for Healthcare Providers:   GravelBags.it  This test is no t yet approved or cleared by the [place] FDA and  has been authorized for detection and/or diagnosis of SARS-CoV-2 by FDA under an Emergency Use Authorization (EUA). This EUA will remain  in effect (meaning this test can be used) for the duration of the COVID-19 declaration under Section 564(b)(1) of the Act, 21 U.S.C. section 360bbb-3(b)(1), unless the authorization is terminated or revoked sooner.     Influenza A by PCR NEGATIVE NEGATIVE Final   Influenza B by PCR NEGATIVE NEGATIVE Final    Comment: (NOTE) The Xpert Xpress SARS-CoV-2/FLU/RSV assay is intended as an aid in  the diagnosis of influenza from Nasopharyngeal swab specimens and  should not be used as a sole basis for treatment. Nasal washings and  aspirates are unacceptable for Xpert Xpress SARS-CoV-2/FLU/RSV  testing.  Fact Sheet for Patients: PinkCheek.be  Fact Sheet for Healthcare Providers: GravelBags.it  This test is not yet approved or cleared by the [place] FDA and  has been authorized for detection and/or diagnosis of SARS-CoV-2 by  FDA under an Emergency Use Authorization (EUA). This EUA will remain  in effect (meaning this test can be used) for the duration of the  Covid-19 declaration under Section 564(b)(1) of the Act, 21  U.S.C. section 360bbb-3(b)(1), unless the authorization is  terminated or revoked.    Respiratory Syncytial Virus by PCR NEGATIVE NEGATIVE Final    Comment: (NOTE) Fact Sheet for Patients: PinkCheek.be  Fact Sheet for Healthcare Providers: GravelBags.it  This test is not yet approved or cleared by the [place] FDA and  has been authorized for detection and/or diagnosis of SARS-CoV-2 by  FDA under an Emergency Use Authorization (EUA). This EUA will remain  in effect (meaning this test can be  used) for the duration of the  COVID-19 declaration under Section 564(b)(1) of the Act, 21 U.S.C.  section 360bbb-3(b)(1), unless the authorization is terminated or  revoked. Performed at [facility], [address], [place], [place] [address]   Blood Culture (routine x 2)     Status: None (Preliminary result)   Collection Time: 05/23/2020  6:32 PM   Specimen: Right Antecubital; Blood  Result Value Ref Range Status   Specimen Description RIGHT ANTECUBITAL  Final   Special Requests   Final    BOTTLES DRAWN AEROBIC ONLY Blood Culture adequate volume   Culture   Final    NO GROWTH 2 DAYS Performed at [facility], [address], [place], [place] [address]    Report Status PENDING  Incomplete  Blood Culture (routine x 2)     Status: None (Preliminary result)   Collection Time: 05/17/2020  6:40 PM   Specimen: BLOOD RIGHT HAND  Result Value Ref Range Status   Specimen Description BLOOD RIGHT HAND  Final   Special Requests   Final  BOTTLES DRAWN AEROBIC ONLY Blood Culture adequate volume   Culture   Final    NO GROWTH 2 DAYS Performed at [facility], [address], [place], [place] [address]    Report Status PENDING  Incomplete  MRSA PCR Screening     Status: None   Collection Time: 05/26/20  3:02 PM   Specimen: Nasal Mucosa; Nasopharyngeal  Result Value Ref Range Status   MRSA by PCR NEGATIVE NEGATIVE Final    Comment:        The GeneXpert MRSA Assay (FDA approved for NASAL specimens only), is one component of a comprehensive MRSA colonization surveillance program. It is not intended to diagnose MRSA infection nor to guide or monitor treatment for MRSA infections. Performed at [facility], [address] [place], [place] [address]          Radiology Studies: CT HEAD WO CONTRAST  Result Date: 05/27/2020 CLINICAL DATA:  Delirium, altered mental status after snorting oxycodone and drinking alcohol EXAM: CT HEAD WITHOUT CONTRAST TECHNIQUE: Contiguous axial images were  obtained from the base of the skull through the vertex without intravenous contrast. COMPARISON:  CT 04/08/2019 FINDINGS: Motion degradation of the initial images requiring rescanning of the skull base and posterior fossa with satisfactory second acquisition. Brain: No evidence of acute infarction, hemorrhage, hydrocephalus, extra-axial collection, visible mass lesion or mass effect. Symmetric prominence of the ventricles, cisterns and sulci compatible with parenchymal volume loss. Patchy areas of white matter hypoattenuation are most compatible with chronic microvascular angiopathy. Vascular: Atherosclerotic calcification of the carotid siphons. No hyperdense vessel. Skull: Mild left parieto-occipital scalp stranding and thickening (3/63, 2/26). No large hematoma. No subjacent calvarial fracture. No acute or suspicious osseous lesions. Sinuses/Orbits: Mural thickening throughout the ethmoid, sphenoid and maxillary sinuses. Layering air-fluid levels and pneumatized secretions in the frontal sinuses. Orbital structures are unremarkable aside from prior lens extractions. Other: None. IMPRESSION: 1. No acute intracranial abnormality. 2. Mild left parieto-occipital scalp stranding and thickening without large hematoma. No subjacent calvarial fracture. Correlate for point tenderness/contusive change. 3. Chronic microvascular angiopathy and parenchymal volume loss. 4. Layering air-fluid levels and pneumatized secretions in the frontal sinuses. May correspond with patient's recent substance use versus an acute sinusitis. Electronically Signed   By: Lovena Le M.D.   On: 05/18/2020 19:42   DG Chest Port 1 View  Result Date: 05/22/2020 CLINICAL DATA:  Altered mental status. EXAM: PORTABLE CHEST 1 VIEW COMPARISON:  April 08, 2019 FINDINGS: Cardiomediastinal silhouette is normal. Mediastinal contours appear intact. Calcific atherosclerotic disease of the aorta. There is no evidence of focal airspace consolidation,  pleural effusion or pneumothorax. Osseous structures are without acute abnormality. Soft tissues are grossly normal. IMPRESSION: No active disease. Electronically Signed   By: Fidela Salisbury M.D.   On: 05/26/2020 18:58   US Abdomen Limited RUQ  Result Date: 05/26/2020 CLINICAL DATA:  Elevated liver function tests EXAM: ULTRASOUND ABDOMEN LIMITED RIGHT UPPER QUADRANT COMPARISON:  Abdominal CT 10/17/2018 FINDINGS: Gallbladder: Gallbladder wall thickening which is likely reactive. No gallstone. The technologist could not assess for Murphy sign due to mental status. Common bile duct: Diameter: 2 mm Liver: Known cirrhosis with heterogeneous and lobulated liver. Moderate ascites is seen in the right upper quadrant with pocket measuring up to 3.2 cm ventral to the liver. Portal vein is patent on color Doppler imaging with normal direction of blood flow towards the liver. IMPRESSION: Cirrhosis with moderate ascites similar to a 2020 abdominal CT. Electronically Signed   By: Neva Seat.D.  On: 05/26/2020 10:34        Scheduled Meds: . Chlorhexidine Gluconate Cloth  6 each Topical Daily  . folic acid  1 mg Oral Daily  . lactulose  30 g Oral BID  . LORazepam  0-4 mg Intravenous Q4H   Followed by  . LORazepam  0-4 mg Intravenous Q8H  . multivitamin with minerals  1 tablet Oral Daily  . pantoprazole (PROTONIX) IV  40 mg Intravenous QHS  . thiamine  100 mg Oral Daily   Or  . thiamine  100 mg Intravenous Daily   Continuous Infusions: . sodium chloride 150 mL/hr at 05/27/20 0518  . potassium chloride 10 mEq (05/27/20 1100)     LOS: 2 days    Time spent: 35 minutes    Royer Cristobal Darleen Crocker, DO [facility]  If 7PM-7AM, please contact night-coverage [email or web address] 05/27/2020, 11:36 AM

## 2020-05-27 NOTE — Progress Notes (Signed)
EEG Completed; Results Pending  

## 2020-05-27 NOTE — Procedures (Signed)
Patient Name: Douglas Stout  MRN: [id number]  Epilepsy Attending: Lora Havens  Referring Physician/Provider: Dr Heath Lark Date: 05/27/2020 Duration: 23.06 min  Patient history: 65yo M with ams. EEG to evaluate for seizure.  Level of alertness: comatose/ lethargic  AEDs during EEG study: None  Technical aspects: This EEG study was done with scalp electrodes positioned according to the 10-20 International system of electrode placement. Electrical activity was acquired at a sampling rate of 500Hz  and reviewed with a high frequency filter of 70Hz  and a low frequency filter of 1Hz . EEG data were recorded continuously and digitally stored.   Description: No posterior dominant rhythm was seen. EEG showed continuous generalized 3 to 6 Hz theta-delta slowing.  Hyperventilation and photic stimulation were not performed.     Of note eeg was technically difficult due to significant myogenic artifact.  ABNORMALITY -Continuous slow, generalized  IMPRESSION: This study is  suggestive of moderate to severe diffuse encephalopathy, nonspecific etiology  No seizures or epileptiform discharges were seen throughout the recording.   Douglas Stout Douglas Stout

## 2020-05-28 ENCOUNTER — Inpatient Hospital Stay (HOSPITAL_COMMUNITY): Payer: Medicare HMO

## 2020-05-28 DIAGNOSIS — K7290 Hepatic failure, unspecified without coma: Secondary | ICD-10-CM | POA: Diagnosis not present

## 2020-05-28 LAB — COMPREHENSIVE METABOLIC PANEL WITH GFR
ALT: 26 U/L (ref 0–44)
AST: 64 U/L — ABNORMAL HIGH (ref 15–41)
Albumin: 2.1 g/dL — ABNORMAL LOW (ref 3.5–5.0)
Alkaline Phosphatase: 74 U/L (ref 38–126)
Anion gap: 11 (ref 5–15)
BUN: 12 mg/dL (ref 8–23)
CO2: 11 mmol/L — ABNORMAL LOW (ref 22–32)
Calcium: 7.6 mg/dL — ABNORMAL LOW (ref 8.9–10.3)
Chloride: 123 mmol/L — ABNORMAL HIGH (ref 98–111)
Creatinine, Ser: 1.09 mg/dL (ref 0.61–1.24)
GFR calc Af Amer: >60 mL/min
GFR calc non Af Amer: >60 mL/min
Glucose, Bld: 117 mg/dL — ABNORMAL HIGH (ref 70–99)
Potassium: 2.7 mmol/L — CL (ref 3.5–5.1)
Sodium: 145 mmol/L (ref 135–145)
Total Bilirubin: 2.4 mg/dL — ABNORMAL HIGH (ref 0.3–1.2)
Total Protein: 6.4 g/dL — ABNORMAL LOW (ref 6.5–8.1)

## 2020-05-28 LAB — VITAMIN B12: Vitamin B-12: 2616 pg/mL — ABNORMAL HIGH (ref 180–914)

## 2020-05-28 LAB — PROCALCITONIN: Procalcitonin: 0.29 ng/mL

## 2020-05-28 LAB — MAGNESIUM: Magnesium: 1.6 mg/dL — ABNORMAL LOW (ref 1.7–2.4)

## 2020-05-28 LAB — AMMONIA: Ammonia: 63 umol/L — ABNORMAL HIGH (ref 9–35)

## 2020-05-28 LAB — TSH: TSH: 1.144 u[IU]/mL (ref 0.350–4.500)

## 2020-05-28 LAB — CK: Total CK: 142 U/L (ref 49–397)

## 2020-05-28 MED ORDER — THIAMINE HCL 100 MG/ML IJ SOLN
500.0000 mg | Freq: Three times a day (TID) | INTRAVENOUS | Status: DC
  Administered 2020-05-28 – 2020-05-30 (×8): 500 mg via INTRAVENOUS
  Filled 2020-05-28: qty 5
  Filled 2020-05-28: qty 4
  Filled 2020-05-28 (×3): qty 5
  Filled 2020-05-28: qty 3
  Filled 2020-05-28 (×9): qty 5

## 2020-05-28 MED ORDER — POTASSIUM CHLORIDE 10 MEQ/100ML IV SOLN
10.0000 meq | INTRAVENOUS | Status: AC
  Administered 2020-05-28 (×6): 10 meq via INTRAVENOUS
  Filled 2020-05-28 (×6): qty 100

## 2020-05-28 MED ORDER — PIPERACILLIN-TAZOBACTAM 3.375 G IVPB
3.3750 g | Freq: Three times a day (TID) | INTRAVENOUS | Status: DC
  Administered 2020-05-28 – 2020-05-31 (×8): 3.375 g via INTRAVENOUS
  Filled 2020-05-28 (×8): qty 50

## 2020-05-28 MED ORDER — PIPERACILLIN-TAZOBACTAM 3.375 G IVPB
3.3750 g | Freq: Once | INTRAVENOUS | Status: AC
  Administered 2020-05-28: 3.375 g via INTRAVENOUS
  Filled 2020-05-28: qty 50

## 2020-05-28 MED ORDER — MAGNESIUM SULFATE 2 GM/50ML IV SOLN
2.0000 g | Freq: Once | INTRAVENOUS | Status: AC
  Administered 2020-05-28: 2 g via INTRAVENOUS
  Filled 2020-05-28: qty 50

## 2020-05-28 MED ORDER — LACTULOSE ENEMA
300.0000 mL | Freq: Two times a day (BID) | ORAL | Status: DC
  Administered 2020-05-28 – 2020-05-30 (×6): 300 mL via RECTAL
  Filled 2020-05-28 (×13): qty 300

## 2020-05-28 NOTE — Progress Notes (Signed)
Pharmacy Antibiotic Note  Douglas Stout is a 64 y.o. male admitted on 05/07/2020 with aspiration pneumonia.  Pharmacy has been consulted for Zosyn dosing.  Plan: Zosyn 3.375g IV q8h (4 hour infusion).  Monitor labs, c/s, and patient improvement.  Height: 5\' 8"  (172.7 cm) Weight: 60.3 kg (132 lb 15 oz) IBW/kg (Calculated) : 68.4  Temp (24hrs), Avg:98.6 F (37 C), Min:97.7 F (36.5 C), Max:99.7 F (37.6 C)  Recent Labs  Lab 05/15/2020 1717 05/21/2020 1717 05/19/2020 2230 05/21/2020 2230 05/26/20 0534 05/26/20 0915 05/26/20 1435 05/27/20 0707 05/27/20 1648 05/28/20 0506  WBC 10.5  --  10.5  --   --  12.6*  --  10.9*  --   --   CREATININE 1.37*   < > 1.13   < > 1.13  --  1.01 1.05 1.07 1.09  LATICACIDVEN 4.3*  --  1.5  --   --   --   --   --   --   --    < > = values in this interval not displayed.    Estimated Creatinine Clearance: 57.6 mL/min (by C-G formula based on SCr of 1.09 mg/dL).    No Known Allergies  Antimicrobials this admission: Zosyn 9/28 >>     Microbiology results: 9/25 BCx: ngtd 9/25 UCx: ng   9/26 MRSA PCR: neg  Thank you for allowing pharmacy to be a part of this patient's care.  Ramond Craver 05/28/2020 3:03 PM

## 2020-05-28 NOTE — Progress Notes (Signed)
SLP Cancellation Note  Patient Details Name: Douglas Stout MRN: [id number] DOB: 10/30/1955   Cancelled treatment:       Reason Eval/Treat Not Completed: Fatigue/lethargy limiting ability to participate; Pt continues to be hyper somnolent and inappropriate for po intake/swallow evaluation at this time. SLP will check back tomorrow.   Thank you,  Genene Churn, [place]    [place] 05/28/2020, 5:36 PM

## 2020-05-28 NOTE — Consult Note (Signed)
Douglas A. [name], MD     [email or web address]          Douglas Stout is an 64 y.o. male.   ASSESSMENT/PLAN: 1.  Multifactorial toxic metabolic encephalopathy: Etiologies includes hyperammonemia, lactic acidosis, dehydration and acute intoxication from substance of abuse.  Additionally, it is possible that there may have been some ischemic anoxic brain injury. Continue with current supportive care.  Consider repeating EEG if not improved within 2 days.  Additional labs have been obtained 2.  Polysubstance drug abuse including alcohol and opioids    The patient is 64 year old white male who has a history of polysubstance drug abuse, who became unresponsive after snorting his opioid tablets.  Patient presented to the hospital with unresponsiveness and stupor.  He was not noted to have focal neurological deficit.  No reports of convulsions or seizures.  He has had multiple metabolic derangements including elevated ammonia level of 128, lactic acid of 4.4, CPK of 400 and potassium of 2.4.  Imaging has been unrevealing.  He continues to be mostly unresponsive.     GENERAL: He is unkept and appears under nourished.  HEENT: No oral trauma noted.  No other evidence of injury noted.  Neck is supple.  ABDOMEN: soft  EXTREMITIES: No edema   BACK: Normal  SKIN: Normal by inspection.    MENTAL STATUS: He lays in bed with eyes closed.  There is no eye-opening even to painful stimuli.  He moans and groans.  He does not follow commands.  There is otherwise no verbal output.  CRANIAL NERVES: Pupils are equal, round and reactive to light; extra ocular movements are full -by oculocephalic reflexes; visual fields limited but appears to be full, corneal reflexes are intact; upper and lower facial muscles are normal in strength and symmetric, there is no flattening of the nasolabial folds.  MOTOR: There is antigravity response to painful stimuli bilaterally.  There is some  suggestion of decorticate posturing bilaterally.  COORDINATION: Left finger to nose is normal, right finger to nose is normal, No rest tremor; no intention tremor; no postural tremor; no bradykinesia.  REFLEXES: Deep tendon reflexes are symmetrical and normal.   SENSATION: Normal to pain.        Blood pressure 122/68, pulse (!) 116, temperature 98.3 F (36.8 C), temperature source Oral, resp. rate (!) 32, height 5\' 8"  (1.727 m), weight 60.3 kg, SpO2 99 %.  Past Medical History:  Diagnosis Date  . Alcoholism /alcohol abuse ([place])   . Complication of anesthesia   . Dyspnea   . GERD (gastroesophageal reflux disease)     Past Surgical History:  Procedure Laterality Date  . BUNIONECTOMY Right   . COLONOSCOPY WITH PROPOFOL N/A 10/30/2016   Procedure: COLONOSCOPY WITH PROPOFOL;  Surgeon: Rogene Houston, MD;  [facility];  Service: Endoscopy;  Laterality: N/A;  7:30  . POLYPECTOMY  10/30/2016   Procedure: POLYPECTOMY;  Surgeon: Rogene Houston, MD;  Location: [facility];  Service: Endoscopy;;  colon    Family History  Problem Relation Age of Onset  . Cancer Mother   . Cancer Brother     Social History:  reports that he has been smoking cigarettes. He has a 84.00 pack-year smoking history. His smokeless tobacco use includes chew. He reports current alcohol use. He reports that he does not use drugs.  Allergies: No Known Allergies  Medications: Prior to Admission medications   Medication Sig Start Date End Date Taking? Authorizing Provider  albuterol (  PROVENTIL HFA;VENTOLIN HFA) 108 (90 Base) MCG/ACT inhaler Inhale 1 puff into the lungs every 6 (six) hours as needed for wheezing or shortness of breath.    [provider]  clonazePAM (KLONOPIN) 1 MG tablet Take 1 tablet by mouth daily. 03/23/19   [provider]  oxyCODONE (OXY IR/ROXICODONE) 5 MG immediate release tablet Take 5 mg by mouth 3 (three) times daily.    [provider]    pantoprazole (PROTONIX) 40 MG tablet Take 40 mg by mouth daily.    [provider]  potassium chloride SA (K-DUR,KLOR-CON) 20 MEQ tablet Take 1 tablet (20 mEq total) by mouth daily. Patient not taking: Reported on 05/26/2020 08/29/18   Douglas Rice, MD  sucralfate (CARAFATE) 1 g tablet Take 1 tablet (1 g total) by mouth 4 (four) times daily -  with meals and at bedtime. Patient not taking: Reported on 05/26/2020 08/29/18   Douglas Rice, MD    Scheduled Meds: . Chlorhexidine Gluconate Cloth  6 each Topical Daily  . folic acid  1 mg Oral Daily  . lactulose  300 mL Rectal BID  . LORazepam  0-4 mg Intravenous Q8H  . multivitamin with minerals  1 tablet Oral Daily  . pantoprazole (PROTONIX) IV  40 mg Intravenous QHS   Continuous Infusions: . sodium chloride 50 mL/hr at 05/28/20 1812  . piperacillin-tazobactam (ZOSYN)  IV    . thiamine injection 500 mg (05/28/20 1621)   PRN Meds:.labetalol, LORazepam **OR** LORazepam, ondansetron **OR** ondansetron (ZOFRAN) IV     Results for orders placed or performed during the hospital encounter of 05/01/2020 (from the past 48 hour(s))  Basic metabolic panel     Status: Abnormal   Collection Time: 05/27/20  7:07 AM  Result Value Ref Range   Sodium 142 135 - 145 mmol/L   Potassium 2.5 (LL) 3.5 - 5.1 mmol/L    Comment: CRITICAL RESULT CALLED TO, READ BACK BY AND VERIFIED WITH: MURPHY,E AT 8:15AM ON 05/27/20 BY FESTERMAN,C    Chloride 117 (H) 98 - 111 mmol/L   CO2 13 (L) 22 - 32 mmol/L   Glucose, Bld 99 70 - 99 mg/dL    Comment: Glucose reference range applies only to samples taken after fasting for at least 8 hours.   BUN 11 8 - 23 mg/dL   Creatinine, Ser 1.05 0.61 - 1.24 mg/dL   Calcium 7.4 (L) 8.9 - 10.3 mg/dL   GFR calc non Af Amer >60 >60 mL/min   GFR calc Af Amer >60 >60 mL/min   Anion gap 12 5 - 15    Comment: Performed at [facility], [address] [place], [place] [address]  Magnesium     Status: None   Collection  Time: 05/27/20  7:07 AM  Result Value Ref Range   Magnesium 1.7 1.7 - 2.4 mg/dL    Comment: Performed at [facility], [address] [place],  [address]  CBC     Status: Abnormal   Collection Time: 05/27/20  7:07 AM  Result Value Ref Range   WBC 10.9 (H) 4.0 - 10.5 K/uL   RBC 2.90 (L) 4.22 - 5.81 MIL/uL   Hemoglobin 9.6 (L) 13.0 - 17.0 g/dL   HCT 27.6 (L) 39 - 52 %   MCV 95.2 80.0 - 100.0 fL   MCH 33.1 26.0 - 34.0 pg   MCHC 34.8 30.0 - 36.0 g/dL   RDW 16.6 (H) 11.5 - 15.5 %   Platelets 168 150 - 400 K/uL  nRBC 0.0 0.0 - 0.2 %    Comment: Performed at [facility], [address] [place], [place] [address]  CK     Status: None   Collection Time: 05/27/20  7:07 AM  Result Value Ref Range   Total CK 258 49.0 - 397.0 U/L    Comment: Performed at [facility], [address], [place], [place] [address]  Basic metabolic panel     Status: Abnormal   Collection Time: 05/27/20  4:48 PM  Result Value Ref Range   Sodium 142 135 - 145 mmol/L   Potassium 3.2 (L) 3.5 - 5.1 mmol/L    Comment: DELTA CHECK NOTED   Chloride 121 (H) 98 - 111 mmol/L   CO2 11 (L) 22 - 32 mmol/L   Glucose, Bld 110 (H) 70 - 99 mg/dL    Comment: Glucose reference range applies only to samples taken after fasting for at least 8 hours.   BUN 11 8 - 23 mg/dL   Creatinine, Ser 1.07 0.61 - 1.24 mg/dL   Calcium 7.5 (L) 8.9 - 10.3 mg/dL   GFR calc non Af Amer >60 >60 mL/min   GFR calc Af Amer >60 >60 mL/min   Anion gap 10 5 - 15    Comment: Performed at [facility], [address] [place], [place] [address]  Magnesium     Status: Abnormal   Collection Time: 05/28/20  5:06 AM  Result Value Ref Range   Magnesium 1.6 (L) 1.7 - 2.4 mg/dL    Comment: Performed at [facility], [address] [place], [place] [address]  Ammonia     Status: Abnormal   Collection Time: 05/28/20  5:06 AM  Result Value Ref Range   Ammonia 63 (H) 9 - 35 umol/L    Comment: Performed at [facility], [address], [place], [place]  [address]  CK     Status: None   Collection Time: 05/28/20  5:06 AM  Result Value Ref Range   Total CK 142 49.0 - 397.0 U/L    Comment: Performed at [facility], [address], [place], [place] [address]  Comprehensive metabolic panel     Status: Abnormal   Collection Time: 05/28/20  5:06 AM  Result Value Ref Range   Sodium 145 135 - 145 mmol/L   Potassium 2.7 (LL) 3.5 - 5.1 mmol/L    Comment: CRITICAL RESULT CALLED TO, READ BACK BY AND VERIFIED WITH: WADE,S AT 6:15AM ON 05/28/20 BY FESTERMAN,C    Chloride 123 (H) 98 - 111 mmol/L   CO2 11 (L) 22 - 32 mmol/L   Glucose, Bld 117 (H) 70 - 99 mg/dL    Comment: Glucose reference range applies only to samples taken after fasting for at least 8 hours.   BUN 12 8 - 23 mg/dL   Creatinine, Ser 1.09 0.61 - 1.24 mg/dL   Calcium 7.6 (L) 8.9 - 10.3 mg/dL   Total Protein 6.4 (L) 6.5 - 8.1 g/dL   Albumin 2.1 (L) 3.5 - 5.0 g/dL   AST 64 (H) 15 - 41 U/L   ALT 26 0 - 44 U/L   Alkaline Phosphatase 74 38 - 126 U/L   Total Bilirubin 2.4 (H) 0.3 - 1.2 mg/dL   GFR calc non Af Amer >60 >60 mL/min   GFR calc Af Amer >60 >60 mL/min   Anion gap 11 5 - 15    Comment: Performed at [facility], [address] [place],  [address]  Procalcitonin - Baseline  Status: None   Collection Time: 05/28/20  3:06 PM  Result Value Ref Range   Procalcitonin 0.29 ng/mL    Comment:        Interpretation: PCT (Procalcitonin) <= 0.5 ng/mL: Systemic infection (sepsis) is not likely. Local bacterial infection is possible. (NOTE)       Sepsis PCT Algorithm           Lower Respiratory Tract                                      Infection PCT Algorithm    ----------------------------     ----------------------------         PCT < 0.25 ng/mL                PCT < 0.10 ng/mL          Strongly encourage             Strongly discourage   discontinuation of antibiotics    initiation of antibiotics    ----------------------------     -----------------------------        PCT 0.25 - 0.50 ng/mL            PCT 0.10 - 0.25 ng/mL               OR       >80% decrease in PCT            Discourage initiation of                                            antibiotics      Encourage discontinuation           of antibiotics    ----------------------------     -----------------------------         PCT >= 0.50 ng/mL              PCT 0.26 - 0.50 ng/mL               AND        <80% decrease in PCT             Encourage initiation of                                             antibiotics       Encourage continuation           of antibiotics    ----------------------------     -----------------------------        PCT >= 0.50 ng/mL                  PCT > 0.50 ng/mL               AND         increase in PCT                  Strongly encourage                                      initiation of antibiotics  Strongly encourage escalation           of antibiotics                                     -----------------------------                                           PCT <= 0.25 ng/mL                                                 OR                                        > 80% decrease in PCT                                      Discontinue / Do not initiate                                             antibiotics  Performed at [facility], [address] [place], [place] [address]     Studies/Results:  EEG Description: No posterior dominant rhythm was seen. EEG showed continuous generalized 3 to 6 Hz theta-delta slowing.  Hyperventilation and photic stimulation were not performed.      Of note eeg was technically difficult due to significant myogenic artifact.   ABNORMALITY -Continuous slow, generalized   IMPRESSION: This study is  suggestive of moderate to severe diffuse encephalopathy, nonspecific etiology  No seizures or epileptiform discharges were seen throughout the recording.     BRAIN MRI   FINDINGS: Brain:   The examination is  intermittently motion degraded, limiting evaluation. Most notably, there is mild/moderate motion degradation of the sagittal T1 weighted sequence, moderate motion degradation of the axial T2/FLAIR sequence, moderate motion degradation of the axial T2* sequence, moderate motion degradation of the axial T1 weighted sequence and moderate motion degradation of the coronal T2 weighted sequence.   Stable, moderate generalized cerebral atrophy which is advanced for age.   Moderate multifocal T2/FLAIR hyperintensity within the cerebral white matter is nonspecific, but most commonly related to chronic small vessel ischemia.   There is no acute infarct.   No evidence of intracranial mass.   No chronic intracranial blood products are identified.   No extra-axial fluid collection.   No midline shift.   Vascular: Expected proximal arterial flow voids.   Skull and upper cervical spine: No focal marrow lesion.   Sinuses/Orbits: Visualized orbits show no acute finding. Mild-to-moderate paranasal sinus mucosal thickening most notably within the right frontal sinus, sphenoid sinuses and left maxillary sinus. Bilateral mastoid effusions (greater on the right).   IMPRESSION: Motion degraded examination as described and limiting evaluation.   The diffusion-weighted imaging is of good quality. There is no evidence of acute infarct or other acute intracranial abnormality   Moderate multifocal T2 hyperintense signal  changes within the cerebral white matter which are nonspecific, but most commonly related to chronic small vessel ischemia.   Moderate generalized cerebral atrophy, advanced for age.   Paranasal sinus mucosal thickening.   Bilateral mastoid effusions.     The brain imaging scan is reviewed and shows no acute changes.  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  There is moderate  global atrophy.  No hemorrhage or encephalomalacia is noted.   Arrabella Westerman A. Merlene Stout, M.D.  Diplomate, [employer] of Psychiatry and Neurology ( Neurology). 05/28/2020, 6:15 PM

## 2020-05-28 NOTE — Progress Notes (Signed)
PROGRESS NOTE    Douglas Stout  NUU:[id number] DOB: June 08, 1956 DOA: 05/29/2020 PCP: Lucia Gaskins, MD   Brief Narrative:  Per HPI: Douglas Stout a 64 y.o.malewith a history of hypertension, alcoholism, GERD. Patient is hypersomnolent so HPI obtained from EDP and medical records. Patient brought to the emergency department due to hypersomnolence. By report, the patient is alcoholic and crushes his narcotics and takes them intranasally. He and her friend were doing this last night. The wife found him on the floor this morning. He remained on the floor for the majority of the day and he remained unresponsive. His wife finally called EMS around 4 PM. Patient found to have elevated ammonia level, elevated CK. Imaging normal. Patient started IV fluids and given a lactulose rectal enema.  9/26:Patient was admitted with acute multifactorial encephalopathy with noted elevated ammonia levels in the setting of polysubstance abuse with alcohol as well as narcotics. He is also noted to have rhabdomyolysis.He continues to remain encephalopathic this morning.  9/27: Patient still continues to remain quite encephalopathic this morning.  This is quite unusual and therefore EEG will be obtained with concern for possible alcohol withdrawal seizures.  May need to consider brain MRI if no significant findings noted.  Continue potassium supplementation as he is still quite hypokalemic.  9/28: Patient is still remains quite encephalopathic this morning.  EEG negative for seizure activity.  Plan to pursue brain MRI today for further evaluation.  Continue ongoing potassium supplementation as well as magnesium supplementation as ordered.  Chest x-ray ordered for further evaluation as well.  Assessment & Plan:   Principal Problem:   Hepatic encephalopathy (HCC) Active Problems:   GERD (gastroesophageal reflux disease)   Alcoholism /alcohol abuse   Opioid use disorder   Rhabdomyolysis   Lactic  acidosis   Acute encephalopathy-appears to be hepatic -Urine drug screen and alcohol levels negative on admission -Ammonia levels remain elevated at 63, plan to schedule lactulose enemas as he has been taking p.o. and monitor in a.m. -Right upper quadrant ultrasoundwith cirrhosis and moderate ascites -Keep n.p.o. with SLP evaluation ordered and pending, once able.  Continue gentle IV fluid -CT head with no acute findings and some left-sided scalp trauma, but without hematoma. -Brain MRI ordered for 9/28  Severe hypokalemia/hypomagnesemia -IV repletion ongoing  Rhabdomyolysis-nontraumatic-resolved -Continue gentle IV fluid -Repeat CK level decreasing, will monitor -No sign of significant AKI  Lactic acidosis -Resolved with IV fluid-continue -No evidence of infection and likely secondary to rhabdomyolysis -Continue to monitor  Opiate use disorder -Patient will require substance abuse counseling -UDS negative  Alcoholism -Maintain on alcohol withdrawal protocol  GERD -Continue on Protonix IV daily  DVT prophylaxis:SCDs Code Status:None Family Communication:  Tried calling brother, Kermit Balo, on phone 9/28 with no response Disposition Plan: Status is: Inpatient  Remains inpatient appropriate because:Altered mental status, IV treatments appropriate due to intensity of illness or inability to take PO and Inpatient level of care appropriate due to severity of illness   Dispo: The patient is from:Home Anticipated d/c is to:SNF Anticipated d/c date is: 3 days Patient currently is not medically stable to d/c.Patient continues to remain quite encephalopathic and is not stable for discharge.    Brain MRI ordered for 9/28.  Continue lactulose enemas and potassium supplementation.   Consultants:  None  Procedures:  See below  Antimicrobials:   None   Subjective: Patient seen and evaluated today with  ongoing somnolence/unresponsiveness noted this morning.  No acute overnight events noted.  Objective: Vitals:  05/28/20 0400 05/28/20 0500 05/28/20 0600 05/28/20 0700  BP: 128/61 (!) 122/58 (!) 147/72 (!) 116/93  Pulse: (!) 112 (!) 110 (!) 109 (!) 112  Resp: (!) 25 (!) 28 (!) 24 (!) 23  Temp: 98.8 F (37.1 C)     TempSrc:      SpO2: 100% 100% 100% 100%  Weight:  60.3 kg    Height:        Intake/Output Summary (Last 24 hours) at 05/28/2020 1200 Last data filed at 05/28/2020 0757 Gross per 24 hour  Intake 5641.64 ml  Output 2600 ml  Net 3041.64 ml   Filed Weights   05/26/20 1649 05/27/20 0500 05/28/20 0500  Weight: 59.4 kg 60.7 kg 60.3 kg    Examination:  General exam: Appears unresponsive Respiratory system: Clear to auscultation. Respiratory effort normal. Cardiovascular system: S1 & S2 heard, RRR.  Gastrointestinal system: Abdomen is nondistended, soft and nontender.  Central nervous system: Unresponsive and somnolent Extremities: No edema Skin: Tattoos throughout Psychiatry: Cannot be assessed    Data Reviewed: I have personally reviewed following labs and imaging studies  CBC: Recent Labs  Lab 05/05/2020 1717 05/19/2020 2230 05/26/20 0915 05/27/20 0707  WBC 10.5 10.5 12.6* 10.9*  NEUTROABS 8.2*  --   --   --   HGB 10.6* 9.3* 10.6* 9.6*  HCT 29.7* 26.3* 30.2* 27.6*  MCV 94.0 94.6 94.7 95.2  PLT 183 147* 175 009   Basic Metabolic Panel: Recent Labs  Lab 05/15/2020 2230 05/18/2020 2230 05/26/20 0534 05/26/20 1435 05/27/20 0707 05/27/20 1648 05/28/20 0506  NA 136   < > 139 136 142 142 145  K 2.1*   < > 2.3* 2.3* 2.5* 3.2* 2.7*  CL 113*   < > 114* 112* 117* 121* 123*  CO2 13*   < > 16* 13* 13* 11* 11*  GLUCOSE 113*   < > 91 90 99 110* 117*  BUN 12   < > 12 12 11 11 12   CREATININE 1.13   < > 1.13 1.01 1.05 1.07 1.09  CALCIUM 7.4*   < > 7.6* 7.6* 7.4* 7.5* 7.6*  MG 1.4*  --   --  2.1 1.7  --  1.6*  PHOS 2.0*  --   --   --   --   --   --    < > = values  in this interval not displayed.   GFR: Estimated Creatinine Clearance: 57.6 mL/min (by C-G formula based on SCr of 1.09 mg/dL). Liver Function Tests: Recent Labs  Lab 05/05/2020 1717 05/26/20 0534 05/28/20 0506  AST 104* 84* 64*  ALT 31 26 26   ALKPHOS 91 73 74  BILITOT 3.8* 2.7* 2.4*  PROT 7.3 6.2* 6.4*  ALBUMIN 2.5* 2.1* 2.1*   Recent Labs  Lab 05/29/2020 1717  LIPASE 33   Recent Labs  Lab 05/07/2020 1717 05/26/20 0535 05/28/20 0506  AMMONIA 128* 40* 63*   Coagulation Profile: Recent Labs  Lab 05/12/2020 1831 05/26/20 0534  INR 1.8* 2.0*   Cardiac Enzymes: Recent Labs  Lab 05/30/2020 1831 05/26/20 0534 05/27/20 0707 05/28/20 0506  CKTOTAL 646* 562* 258 142   BNP (last 3 results) No results for input(s): PROBNP in the last 8760 hours. HbA1C: No results for input(s): HGBA1C in the last 72 hours. CBG: Recent Labs  Lab 05/22/2020 1730  GLUCAP 99   Lipid Profile: No results for input(s): CHOL, HDL, LDLCALC, TRIG, CHOLHDL, LDLDIRECT in the last 72 hours. Thyroid Function Tests: No results for input(s):  TSH, T4TOTAL, FREET4, T3FREE, THYROIDAB in the last 72 hours. Anemia Panel: No results for input(s): VITAMINB12, FOLATE, FERRITIN, TIBC, IRON, RETICCTPCT in the last 72 hours. Sepsis Labs: Recent Labs  Lab 05/23/2020 1717 05/20/2020 2230  LATICACIDVEN 4.3* 1.5    Recent Results (from the past 240 hour(s))  Resp Panel by RT PCR (RSV, Flu A&B, Covid) - Nasopharyngeal Swab     Status: None   Collection Time: 05/21/2020  4:58 PM   Specimen: Nasopharyngeal Swab  Result Value Ref Range Status   SARS Coronavirus 2 by RT PCR NEGATIVE NEGATIVE Final    Comment: (NOTE) SARS-CoV-2 target nucleic acids are NOT DETECTED.  The SARS-CoV-2 RNA is generally detectable in upper respiratoy specimens during the acute phase of infection. The lowest concentration of SARS-CoV-2 viral copies this assay can detect is 131 copies/mL. A negative result does not preclude  SARS-Cov-2 infection and should not be used as the sole basis for treatment or other patient management decisions. A negative result may occur with  improper specimen collection/handling, submission of specimen other than nasopharyngeal swab, presence of viral mutation(s) within the areas targeted by this assay, and inadequate number of viral copies (<131 copies/mL). A negative result must be combined with clinical observations, patient history, and epidemiological information. The expected result is Negative.  Fact Sheet for Patients:  PinkCheek.be  Fact Sheet for Healthcare Providers:  GravelBags.it  This test is no t yet approved or cleared by the [place] FDA and  has been authorized for detection and/or diagnosis of SARS-CoV-2 by FDA under an Emergency Use Authorization (EUA). This EUA will remain  in effect (meaning this test can be used) for the duration of the COVID-19 declaration under Section 564(b)(1) of the Act, 21 U.S.C. section 360bbb-3(b)(1), unless the authorization is terminated or revoked sooner.     Influenza A by PCR NEGATIVE NEGATIVE Final   Influenza B by PCR NEGATIVE NEGATIVE Final    Comment: (NOTE) The Xpert Xpress SARS-CoV-2/FLU/RSV assay is intended as an aid in  the diagnosis of influenza from Nasopharyngeal swab specimens and  should not be used as a sole basis for treatment. Nasal washings and  aspirates are unacceptable for Xpert Xpress SARS-CoV-2/FLU/RSV  testing.  Fact Sheet for Patients: PinkCheek.be  Fact Sheet for Healthcare Providers: GravelBags.it  This test is not yet approved or cleared by the [place] FDA and  has been authorized for detection and/or diagnosis of SARS-CoV-2 by  FDA under an Emergency Use Authorization (EUA). This EUA will remain  in effect (meaning this test can be used) for the duration of the   Covid-19 declaration under Section 564(b)(1) of the Act, 21  U.S.C. section 360bbb-3(b)(1), unless the authorization is  terminated or revoked.    Respiratory Syncytial Virus by PCR NEGATIVE NEGATIVE Final    Comment: (NOTE) Fact Sheet for Patients: PinkCheek.be  Fact Sheet for Healthcare Providers: GravelBags.it  This test is not yet approved or cleared by the [place] FDA and  has been authorized for detection and/or diagnosis of SARS-CoV-2 by  FDA under an Emergency Use Authorization (EUA). This EUA will remain  in effect (meaning this test can be used) for the duration of the  COVID-19 declaration under Section 564(b)(1) of the Act, 21 U.S.C.  section 360bbb-3(b)(1), unless the authorization is terminated or  revoked. Performed at [facility], [address] [place], [place] [address]   Urine culture     Status: None   Collection Time: 05/21/2020  5:51 PM  Specimen: In/Out Cath Urine  Result Value Ref Range Status   Specimen Description   Final    IN/OUT CATH URINE Performed at [facility], [address], [place], [place] [address]    Special Requests   Final    NONE Performed at [facility], [address] [place], [place] [address]    Culture   Final    NO GROWTH Performed at [facility], [place] [address] [place], [place] [address]    Report Status 2020-06-07 FINAL  Final  Blood Culture (routine x 2)     Status: None (Preliminary result)   Collection Time: 05/06/2020  6:32 PM   Specimen: Right Antecubital; Blood  Result Value Ref Range Status   Specimen Description RIGHT ANTECUBITAL  Final   Special Requests   Final    BOTTLES DRAWN AEROBIC ONLY Blood Culture adequate volume   Culture   Final    NO GROWTH 2 DAYS Performed at [facility], [address], [place], [place] [address]    Report Status PENDING  Incomplete  Blood Culture (routine x 2)     Status: None (Preliminary result)    Collection Time: 05/03/2020  6:40 PM   Specimen: BLOOD RIGHT HAND  Result Value Ref Range Status   Specimen Description BLOOD RIGHT HAND  Final   Special Requests   Final    BOTTLES DRAWN AEROBIC ONLY Blood Culture adequate volume   Culture   Final    NO GROWTH 2 DAYS Performed at [facility], [address] [place], [place] [address]    Report Status PENDING  Incomplete  MRSA PCR Screening     Status: None   Collection Time: 05/26/20  3:02 PM   Specimen: Nasal Mucosa; Nasopharyngeal  Result Value Ref Range Status   MRSA by PCR NEGATIVE NEGATIVE Final    Comment:        The GeneXpert MRSA Assay (FDA approved for NASAL specimens only), is one component of a comprehensive MRSA colonization surveillance program. It is not intended to diagnose MRSA infection nor to guide or monitor treatment for MRSA infections. Performed at [facility] [address] [place], [place] [address]          Radiology Studies: EEG adult  Result Date: 06/07/20 Lora Havens, MD     06/07/20  4:13 PM Patient Name: Douglas Stout MRN: [id number] Epilepsy Attending: Lora Havens Referring Physician/Provider: Dr Heath Lark Date: June 07, 2020 Duration: 23.06 min Patient history: 64yo M with ams. EEG to evaluate for seizure. Level of alertness: comatose/ lethargic AEDs during EEG study: None Technical aspects: This EEG study was done with scalp electrodes positioned according to the 10-20 International system of electrode placement. Electrical activity was acquired at a sampling rate of 500Hz  and reviewed with a high frequency filter of 70Hz  and a low frequency filter of 1Hz . EEG data were recorded continuously and digitally stored. Description: No posterior dominant rhythm was seen. EEG showed continuous generalized 3 to 6 Hz theta-delta slowing.  Hyperventilation and photic stimulation were not performed.   Of note eeg was technically difficult due to significant myogenic artifact. ABNORMALITY  -Continuous slow, generalized IMPRESSION: This study is  suggestive of moderate to severe diffuse encephalopathy, nonspecific etiology  No seizures or epileptiform discharges were seen throughout the recording. Priyanka Barbra Sarks        Scheduled Meds: . Chlorhexidine Gluconate Cloth  6 each Topical Daily  . folic acid  1 mg Oral Daily  .  lactulose  300 mL Rectal BID  . LORazepam  0-4 mg Intravenous Q8H  . multivitamin with minerals  1 tablet Oral Daily  . pantoprazole (PROTONIX) IV  40 mg Intravenous QHS  . thiamine  100 mg Oral Daily   Or  . thiamine  100 mg Intravenous Daily   Continuous Infusions: . sodium chloride 50 mL/hr at 05/28/20 0845  . potassium chloride 10 mEq (05/28/20 0948)     LOS: 3 days    Time spent: 35 minutes    Tahjay Binion D Manuella Ghazi, DO [facility]  If 7PM-7AM, please contact night-coverage [email or web address] 05/28/2020, 12:00 PM

## 2020-05-29 DIAGNOSIS — K7290 Hepatic failure, unspecified without coma: Secondary | ICD-10-CM | POA: Diagnosis not present

## 2020-05-29 DIAGNOSIS — K219 Gastro-esophageal reflux disease without esophagitis: Secondary | ICD-10-CM | POA: Diagnosis not present

## 2020-05-29 DIAGNOSIS — Z515 Encounter for palliative care: Secondary | ICD-10-CM | POA: Diagnosis not present

## 2020-05-29 DIAGNOSIS — F1020 Alcohol dependence, uncomplicated: Secondary | ICD-10-CM | POA: Diagnosis not present

## 2020-05-29 DIAGNOSIS — Z7189 Other specified counseling: Secondary | ICD-10-CM | POA: Diagnosis not present

## 2020-05-29 DIAGNOSIS — T796XXA Traumatic ischemia of muscle, initial encounter: Secondary | ICD-10-CM

## 2020-05-29 DIAGNOSIS — E872 Acidosis: Secondary | ICD-10-CM | POA: Diagnosis not present

## 2020-05-29 DIAGNOSIS — F1199 Opioid use, unspecified with unspecified opioid-induced disorder: Secondary | ICD-10-CM

## 2020-05-29 LAB — GLUCOSE, CAPILLARY
Glucose-Capillary: 112 mg/dL — ABNORMAL HIGH (ref 70–99)
Glucose-Capillary: 135 mg/dL — ABNORMAL HIGH (ref 70–99)
Glucose-Capillary: 132 mg/dL — ABNORMAL HIGH (ref 70–99)

## 2020-05-29 LAB — AMMONIA: Ammonia: 67 umol/L — ABNORMAL HIGH (ref 9–35)

## 2020-05-29 LAB — CBC
HCT: 30.8 % — ABNORMAL LOW (ref 39.0–52.0)
Hemoglobin: 10.3 g/dL — ABNORMAL LOW (ref 13.0–17.0)
MCH: 32.6 pg (ref 26.0–34.0)
MCHC: 33.4 g/dL (ref 30.0–36.0)
MCV: 97.5 fL (ref 80.0–100.0)
Platelets: 162 10*3/uL (ref 150–400)
RBC: 3.16 MIL/uL — ABNORMAL LOW (ref 4.22–5.81)
RDW: 18.4 % — ABNORMAL HIGH (ref 11.5–15.5)
WBC: 14 10*3/uL — ABNORMAL HIGH (ref 4.0–10.5)
nRBC: 0 % (ref 0.0–0.2)

## 2020-05-29 LAB — MAGNESIUM: Magnesium: 1.8 mg/dL (ref 1.7–2.4)

## 2020-05-29 LAB — COMPREHENSIVE METABOLIC PANEL
ALT: 27 U/L (ref 0–44)
AST: 57 U/L — ABNORMAL HIGH (ref 15–41)
Albumin: 2 g/dL — ABNORMAL LOW (ref 3.5–5.0)
Alkaline Phosphatase: 68 U/L (ref 38–126)
Anion gap: 9 (ref 5–15)
BUN: 13 mg/dL (ref 8–23)
CO2: 11 mmol/L — ABNORMAL LOW (ref 22–32)
Calcium: 7.9 mg/dL — ABNORMAL LOW (ref 8.9–10.3)
Chloride: 128 mmol/L — ABNORMAL HIGH (ref 98–111)
Creatinine, Ser: 1 mg/dL (ref 0.61–1.24)
GFR calc Af Amer: >60 mL/min (ref >60)
GFR calc non Af Amer: >60 mL/min (ref >60)
Glucose, Bld: 101 mg/dL — ABNORMAL HIGH (ref 70–99)
Potassium: 2.7 mmol/L — CL (ref 3.5–5.1)
Sodium: 148 mmol/L — ABNORMAL HIGH (ref 135–145)
Total Bilirubin: 2.8 mg/dL — ABNORMAL HIGH (ref 0.3–1.2)
Total Protein: 6.6 g/dL (ref 6.5–8.1)

## 2020-05-29 LAB — CK: Total CK: 99 U/L (ref 49–397)

## 2020-05-29 LAB — RPR: RPR Ser Ql: NONREACTIVE

## 2020-05-29 MED ORDER — KCL IN DEXTROSE-NACL 20-5-0.45 MEQ/L-%-% IV SOLN: INTRAVENOUS | Status: DC

## 2020-05-29 MED ORDER — MAGNESIUM SULFATE 4 GM/100ML IV SOLN
4.0000 g | Freq: Once | INTRAVENOUS | Status: AC
  Administered 2020-05-29: 4 g via INTRAVENOUS
  Filled 2020-05-29: qty 100

## 2020-05-29 MED ORDER — ALBUTEROL SULFATE (2.5 MG/3ML) 0.083% IN NEBU: 2.5000 mg | INHALATION_SOLUTION | RESPIRATORY_TRACT | Status: DC | PRN

## 2020-05-29 MED ORDER — POTASSIUM CHLORIDE 10 MEQ/100ML IV SOLN
10.0000 meq | INTRAVENOUS | Status: AC
  Administered 2020-05-29 (×4): 10 meq via INTRAVENOUS
  Filled 2020-05-29 (×4): qty 100

## 2020-05-29 NOTE — Progress Notes (Signed)
PROGRESS NOTE    [name]  JJK:[id number] DOB: 1955-11-15 DOA: 05/10/2020 PCP: Lucia Gaskins, MD   Brief Narrative:  Per HPI: Douglas Stout a 64 y.o.malewith a history of hypertension, alcoholism, GERD. Patient is hypersomnolent so HPI obtained from EDP and medical records. Patient brought to the emergency department due to hypersomnolence. By report, the patient is alcoholic and crushes his narcotics and takes them intranasally. He and her friend were doing this last night. The wife found him on the floor this morning. He remained on the floor for the majority of the day and he remained unresponsive. His wife finally called EMS around 4 PM. Patient found to have elevated ammonia level, elevated CK. Imaging normal. Patient started IV fluids and given a lactulose rectal enema.  9/26:Patient was admitted with acute multifactorial encephalopathy with noted elevated ammonia levels in the setting of polysubstance abuse with alcohol as well as narcotics. He is also noted to have rhabdomyolysis.He continues to remain encephalopathic this morning.  9/27: Patient still continues to remain quite encephalopathic this morning.  This is quite unusual and therefore EEG will be obtained with concern for possible alcohol withdrawal seizures.  May need to consider brain MRI if no significant findings noted.  Continue potassium supplementation as he is still quite hypokalemic.  9/28: Patient is still remains quite encephalopathic this morning.  EEG negative for seizure activity.  Plan to pursue brain MRI today for further evaluation.  Continue ongoing potassium supplementation as well as magnesium supplementation as ordered.  Chest x-ray ordered for further evaluation as well.  9/29: pt remains encephalopathic.  Lactic acid remains elevated.  Spoke with brother at bedside.  Palliative medicine consult requested.    Assessment & Plan:   Principal Problem:   Hepatic encephalopathy  (HCC) Active Problems:   GERD (gastroesophageal reflux disease)   Alcoholism /alcohol abuse   Opioid use disorder   Rhabdomyolysis   Lactic acidosis   Acute toxic metabolic encephalopathy-multifactorial.  -Urine drug screen and alcohol levels negative on admission -Ammonia levels remain elevated at 63, plan to schedule lactulose enemas as he has been taking p.o. and monitor in a.m. -Right upper quadrant ultrasoundwith cirrhosis and moderate ascites -Keep n.p.o. with SLP evaluation ordered and pending, once able.  Continue gentle IV fluid -CT head with no acute findings and some left-sided scalp trauma, but without hematoma. -Brain MRI 9/28 no acute findings -Neurology consult 9/28: continue supportive care and consider repeat EEG if remains encephalopathic in 2 more days  Severe hypokalemia/hypomagnesemia -IV repletion continued today, recheck levels in AM.   Rhabdomyolysis-nontraumatic-resolved -Treated with gentle IV fluid -Repeat CK level decreasing, will monitor -No sign of significant AKI  Lactic acidosis -Resolved with IV fluid-continue -No evidence of infection and likely secondary to rhabdomyolysis -Continue to monitor  Opiate use disorder -Patient will require substance abuse counseling -UDS negative  Alcoholism -Maintain on alcohol withdrawal protocol -He is receiving high dose IV thiamine treatments x 3 day course  GERD -Continue on Protonix IV daily  DVT prophylaxis:SCDs Code Status:Full Family Communication: brother at bedside updated 9/29 Disposition Plan:TBD Status is: Inpatient  Remains inpatient appropriate because:Altered mental status, IV treatments appropriate due to intensity of illness or inability to take PO and Inpatient level of care appropriate due to severity of illness   Dispo: The patient is from:Home Anticipated d/c is to:SNF Anticipated d/c date is: 2-3 days pending palliative  discussions Patient currently is not medically stable to d/c.Patient continues to remain quite encephalopathic and is not  stable for discharge.   Continue lactulose enemas and potassium supplementation.  Palliative medicine consultation requested 9/29.    Consultants:  Neurology  Palliative medicine  Procedures:  See below  Antimicrobials:   None  Subjective: Patient remains encephalopathic.   Objective: Vitals:   05/29/20 0600 05/29/20 0700 05/29/20 0800 05/29/20 0900  BP: (!) 133/56 (!) 92/42 (!) 117/54 134/78  Pulse: (!) 106 95 99 (!) 106  Resp: (!) 27 (!) 24 (!) 26 (!) 27  Temp:  98.2 F (36.8 C)    TempSrc:  Axillary    SpO2: 100% 100% 100% 97%  Weight:      Height:        Intake/Output Summary (Last 24 hours) at 05/29/2020 1035 Last data filed at 05/28/2020 1703 Gross per 24 hour  Intake 1051.44 ml  Output 450 ml  Net 601.44 ml   Filed Weights   05/26/20 1649 05/27/20 0500 05/28/20 0500  Weight: 59.4 kg 60.7 kg 60.3 kg    Examination:  General exam: obtunded, non responsive, encephalopathic  Respiratory system: coarse BS with diminished BS on right side.  Cardiovascular system: normal S1 & S2 heard.  Gastrointestinal system: Abdomen is nondistended, soft and nontender.  Central nervous system: Unresponsive and somnolent Extremities: no clubbing, no cyanosis, trace pretibial edema BLEs.  Skin: Tattoos throughout Psychiatry: Unable to assess  Data Reviewed: I have personally reviewed following labs and imaging studies  CBC: Recent Labs  Lab 05/05/2020 1717 05/11/2020 2230 05/26/20 0915 05/27/20 0707 05/29/20 0430  WBC 10.5 10.5 12.6* 10.9* 14.0*  NEUTROABS 8.2*  --   --   --   --   HGB 10.6* 9.3* 10.6* 9.6* 10.3*  HCT 29.7* 26.3* 30.2* 27.6* 30.8*  MCV 94.0 94.6 94.7 95.2 97.5  PLT 183 147* 175 168 448   Basic Metabolic Panel: Recent Labs  Lab 05/14/2020 2230 05/26/20 0534 05/26/20 1435 05/27/20 0707 05/27/20 1648  05/28/20 0506 05/29/20 0430  NA 136   < > 136 142 142 145 148*  K 2.1*   < > 2.3* 2.5* 3.2* 2.7* 2.7*  CL 113*   < > 112* 117* 121* 123* 128*  CO2 13*   < > 13* 13* 11* 11* 11*  GLUCOSE 113*   < > 90 99 110* 117* 101*  BUN 12   < > 12 11 11 12 13   CREATININE 1.13   < > 1.01 1.05 1.07 1.09 1.00  CALCIUM 7.4*   < > 7.6* 7.4* 7.5* 7.6* 7.9*  MG 1.4*  --  2.1 1.7  --  1.6* 1.8  PHOS 2.0*  --   --   --   --   --   --    < > = values in this interval not displayed.   GFR: Estimated Creatinine Clearance: 62.8 mL/min (by C-G formula based on SCr of 1 mg/dL). Liver Function Tests: Recent Labs  Lab 05/07/2020 1717 05/26/20 0534 05/28/20 0506 05/29/20 0430  AST 104* 84* 64* 57*  ALT 31 26 26 27   ALKPHOS 91 73 74 68  BILITOT 3.8* 2.7* 2.4* 2.8*  PROT 7.3 6.2* 6.4* 6.6  ALBUMIN 2.5* 2.1* 2.1* 2.0*   Recent Labs  Lab 05/16/2020 1717  LIPASE 33   Recent Labs  Lab 05/17/2020 1717 05/26/20 0535 05/28/20 0506 05/29/20 0430  AMMONIA 128* 40* 63* 67*   Coagulation Profile: Recent Labs  Lab 05/24/2020 1831 05/26/20 0534  INR 1.8* 2.0*   Cardiac Enzymes: Recent Labs  Lab 05/05/2020 1831 05/26/20  0488 05/27/20 0707 05/28/20 0506 05/29/20 0430  CKTOTAL 646* 562* 258 142 99   BNP (last 3 results) No results for input(s): PROBNP in the last 8760 hours. HbA1C: No results for input(s): HGBA1C in the last 72 hours. CBG: Recent Labs  Lab 05/08/2020 1730  GLUCAP 99   Lipid Profile: No results for input(s): CHOL, HDL, LDLCALC, TRIG, CHOLHDL, LDLDIRECT in the last 72 hours. Thyroid Function Tests: Recent Labs    05/28/20 2107  TSH 1.144   Anemia Panel: Recent Labs    05/28/20 2107  VITAMINB12 2,616*   Sepsis Labs: Recent Labs  Lab 05/16/2020 1717 05/10/2020 2230 05/28/20 1506  PROCALCITON  --   --  0.29  LATICACIDVEN 4.3* 1.5  --     Recent Results (from the past 240 hour(s))  Resp Panel by RT PCR (RSV, Flu A&B, Covid) - Nasopharyngeal Swab     Status: None    Collection Time: 05/20/2020  4:58 PM   Specimen: Nasopharyngeal Swab  Result Value Ref Range Status   SARS Coronavirus 2 by RT PCR NEGATIVE NEGATIVE Final    Comment: (NOTE) SARS-CoV-2 target nucleic acids are NOT DETECTED.  The SARS-CoV-2 RNA is generally detectable in upper respiratoy specimens during the acute phase of infection. The lowest concentration of SARS-CoV-2 viral copies this assay can detect is 131 copies/mL. A negative result does not preclude SARS-Cov-2 infection and should not be used as the sole basis for treatment or other patient management decisions. A negative result may occur with  improper specimen collection/handling, submission of specimen other than nasopharyngeal swab, presence of viral mutation(s) within the areas targeted by this assay, and inadequate number of viral copies (<131 copies/mL). A negative result must be combined with clinical observations, patient history, and epidemiological information. The expected result is Negative.  Fact Sheet for Patients:  PinkCheek.be  Fact Sheet for Healthcare Providers:  GravelBags.it  This test is no t yet approved or cleared by the [place] FDA and  has been authorized for detection and/or diagnosis of SARS-CoV-2 by FDA under an Emergency Use Authorization (EUA). This EUA will remain  in effect (meaning this test can be used) for the duration of the COVID-19 declaration under Section 564(b)(1) of the Act, 21 U.S.C. section 360bbb-3(b)(1), unless the authorization is terminated or revoked sooner.     Influenza A by PCR NEGATIVE NEGATIVE Final   Influenza B by PCR NEGATIVE NEGATIVE Final    Comment: (NOTE) The Xpert Xpress SARS-CoV-2/FLU/RSV assay is intended as an aid in  the diagnosis of influenza from Nasopharyngeal swab specimens and  should not be used as a sole basis for treatment. Nasal washings and  aspirates are unacceptable for Xpert  Xpress SARS-CoV-2/FLU/RSV  testing.  Fact Sheet for Patients: PinkCheek.be  Fact Sheet for Healthcare Providers: GravelBags.it  This test is not yet approved or cleared by the [place] FDA and  has been authorized for detection and/or diagnosis of SARS-CoV-2 by  FDA under an Emergency Use Authorization (EUA). This EUA will remain  in effect (meaning this test can be used) for the duration of the  Covid-19 declaration under Section 564(b)(1) of the Act, 21  U.S.C. section 360bbb-3(b)(1), unless the authorization is  terminated or revoked.    Respiratory Syncytial Virus by PCR NEGATIVE NEGATIVE Final    Comment: (NOTE) Fact Sheet for Patients: PinkCheek.be  Fact Sheet for Healthcare Providers: GravelBags.it  This test is not yet approved or cleared by the [place] FDA and  has been  authorized for detection and/or diagnosis of SARS-CoV-2 by  FDA under an Emergency Use Authorization (EUA). This EUA will remain  in effect (meaning this test can be used) for the duration of the  COVID-19 declaration under Section 564(b)(1) of the Act, 21 U.S.C.  section 360bbb-3(b)(1), unless the authorization is terminated or  revoked. Performed at [facility], [address], [place], [place] [address]   Urine culture     Status: None   Collection Time: 05/09/2020  5:51 PM   Specimen: In/Out Cath Urine  Result Value Ref Range Status   Specimen Description   Final    IN/OUT CATH URINE Performed at [facility], [address] [place], [place] [address]    Special Requests   Final    NONE Performed at [facility], [address] [place], [place] [address]    Culture   Final    NO GROWTH Performed at [facility], [place] [address] [place], [place] [address]    Report Status 05/27/2020 FINAL  Final  Blood Culture (routine x 2)     Status: None (Preliminary result)    Collection Time: 05/11/2020  6:32 PM   Specimen: Right Antecubital; Blood  Result Value Ref Range Status   Specimen Description RIGHT ANTECUBITAL  Final   Special Requests   Final    BOTTLES DRAWN AEROBIC ONLY Blood Culture adequate volume   Culture   Final    NO GROWTH 4 DAYS Performed at [facility], [address], [place], [place] [address]    Report Status PENDING  Incomplete  Blood Culture (routine x 2)     Status: None (Preliminary result)   Collection Time: 05/08/2020  6:40 PM   Specimen: BLOOD RIGHT HAND  Result Value Ref Range Status   Specimen Description BLOOD RIGHT HAND  Final   Special Requests   Final    BOTTLES DRAWN AEROBIC ONLY Blood Culture adequate volume   Culture   Final    NO GROWTH 4 DAYS Performed at [facility], [address], [place], [place] [address]    Report Status PENDING  Incomplete  MRSA PCR Screening     Status: None   Collection Time: 05/26/20  3:02 PM   Specimen: Nasal Mucosa; Nasopharyngeal  Result Value Ref Range Status   MRSA by PCR NEGATIVE NEGATIVE Final    Comment:        The GeneXpert MRSA Assay (FDA approved for NASAL specimens only), is one component of a comprehensive MRSA colonization surveillance program. It is not intended to diagnose MRSA infection nor to guide or monitor treatment for MRSA infections. Performed at [facility], [address], [place], [place] [address]     Radiology Studies: DG Chest 1 View  Result Date: 05/28/2020 CLINICAL DATA:  Wheezing, shortness of breath. EXAM: CHEST  1 VIEW COMPARISON:  May 25, 2020. FINDINGS: The heart size and mediastinal contours are within normal limits. No pneumothorax is noted. Right lung is clear. New left upper lobe airspace opacity is noted concerning for pneumonia. Possible small left pleural effusion may be present. The visualized skeletal structures are unremarkable. IMPRESSION: New left upper lobe airspace opacity concerning for pneumonia. Possible small left  pleural effusion may be present. Followup PA and lateral chest X-ray is recommended in 3-4 weeks following trial of antibiotic therapy to ensure resolution and exclude underlying malignancy. Electronically Signed   By: Marijo Conception M.D.   On: 05/28/2020 13:38   MR BRAIN WO CONTRAST  Result Date: 05/28/2020 CLINICAL DATA:  Mental status change, unknown cause. Additional history provided: Altered mental status for 2 days. EXAM: MRI HEAD WITHOUT CONTRAST TECHNIQUE: Multiplanar, multiecho pulse sequences of the brain and surrounding structures were obtained without intravenous contrast. COMPARISON:  Head CT 05/05/2020. FINDINGS: Brain: The examination is intermittently motion degraded, limiting evaluation. Most notably, there is mild/moderate motion degradation of the sagittal T1 weighted sequence, moderate motion degradation of the axial T2/FLAIR sequence, moderate motion degradation of the axial T2* sequence, moderate motion degradation of the axial T1 weighted sequence and moderate motion degradation of the coronal T2 weighted sequence. Stable, moderate generalized cerebral atrophy which is advanced for age. Moderate multifocal T2/FLAIR hyperintensity within the cerebral white matter is nonspecific, but most commonly related to chronic small vessel ischemia. There is no acute infarct. No evidence of intracranial mass. No chronic intracranial blood products are identified. No extra-axial fluid collection. No midline shift. Vascular: Expected proximal arterial flow voids. Skull and upper cervical spine: No focal marrow lesion. Sinuses/Orbits: Visualized orbits show no acute finding. Mild-to-moderate paranasal sinus mucosal thickening most notably within the right frontal sinus, sphenoid sinuses and left maxillary sinus. Bilateral mastoid effusions (greater on the right). IMPRESSION: Motion degraded examination as described and limiting evaluation. The diffusion-weighted imaging is of good quality. There is no  evidence of acute infarct or other acute intracranial abnormality Moderate multifocal T2 hyperintense signal changes within the cerebral white matter which are nonspecific, but most commonly related to chronic small vessel ischemia. Moderate generalized cerebral atrophy, advanced for age. Paranasal sinus mucosal thickening. Bilateral mastoid effusions. Electronically Signed   By: Kellie Simmering DO   On: 05/28/2020 12:31   EEG adult  Result Date: 05/27/2020 Lora Havens, MD     05/27/2020  4:13 PM Patient Name: Douglas Stout MRN: [id number] Epilepsy Attending: Lora Havens Referring Physician/Provider: Dr Heath Lark Date: 05/27/2020 Duration: 23.06 min Patient history: 64yo M with ams. EEG to evaluate for seizure. Level of alertness: comatose/ lethargic AEDs during EEG study: None Technical aspects: This EEG study was done with scalp electrodes positioned according to the 10-20 International system of electrode placement. Electrical activity was acquired at a sampling rate of 500Hz  and reviewed with a high frequency filter of 70Hz  and a low frequency filter of 1Hz . EEG data were recorded continuously and digitally stored. Description: No posterior dominant rhythm was seen. EEG showed continuous generalized 3 to 6 Hz theta-delta slowing.  Hyperventilation and photic stimulation were not performed.   Of note eeg was technically difficult due to significant myogenic artifact. ABNORMALITY -Continuous slow, generalized IMPRESSION: This study is  suggestive of moderate to severe diffuse encephalopathy, nonspecific etiology  No seizures or epileptiform discharges were seen throughout the recording. Priyanka Barbra Sarks   Scheduled Meds: . Chlorhexidine Gluconate Cloth  6 each Topical Daily  . folic acid  1 mg Oral Daily  . lactulose  300 mL Rectal BID  . LORazepam  0-4 mg Intravenous Q8H  . multivitamin with minerals  1 tablet Oral Daily  . pantoprazole (PROTONIX) IV  40 mg Intravenous QHS   Continuous  Infusions: . dextrose 5 % and 0.45 % NaCl with KCl 20 mEq/L 50 mL/hr at 05/29/20 0753  . magnesium sulfate bolus IVPB    . piperacillin-tazobactam (ZOSYN)  IV 3.375 g (05/29/20 0520)  . potassium chloride 10 mEq (05/29/20 1000)  . thiamine injection 500 mg (05/28/20 2204)     LOS: 4 days    Critical Care Procedure Note  Authorized and Performed by: Murvin Natal MD  Total Critical Care time:  38 mins Due to a high probability of clinically significant, life threatening deterioration, the patient required my highest level of preparedness to intervene emergently and I personally spent this critical care time directly and personally managing the patient.  This critical care time included obtaining a history; examining the patient, pulse oximetry; ordering and review of studies; arranging urgent treatment with development of a management plan; evaluation of patient's response of treatment; frequent reassessment; and discussions with other providers.  This critical care time was performed to assess and manage the high probability of imminent and life threatening deterioration that could result in multi-organ failure.  It was exclusive of separately billable procedures and treating other patients and teaching time.    Irwin Brakeman, MD How to contact the [facility] Attending or Consulting provider [place] or covering provider during after hours [place], for this patient?  1. Check the care team in [facility] and look for a) attending/consulting [facility] provider listed and b) the [facility] team listed 2. Log into [email or web address] and use [place]'s universal password to access. If you do not have the password, please contact the hospital operator. 3. Locate the [facility] provider you are looking for under [facility] and page to a number that you can be directly reached. 4. If you still have difficulty reaching the provider, please page the [facility] (Director on Call) for the Hospitalists listed on amion for assistance.  [facility]  If 7PM-7AM, please contact night-coverage [email or web address] 05/29/2020, 10:35 AM

## 2020-05-29 NOTE — Consult Note (Addendum)
Consultation Note Date: 05/29/2020   Patient Name: Douglas Stout  DOB: December 23, 1955  MRN: [id number]  Age / Sex: 64 y.o., male  PCP: Lucia Gaskins, MD Referring Physician: Murlean Iba, MD  Reason for Consultation: Establishing goals of care  HPI/Patient Profile: 64 y.o. male  with past medical history of hypertension, GERD, alcohol and opioid abuse admitted on 05/26/2020 with unresponsive at home and found to have elevated ammonia with diagnosis of cirrhosis with moderate ascites, rhabdomyolysis, and concern for alcohol withdraw. EEG negative for seizures and MRI with no acute findings but with significant microvascular changes and moderate global atrophy. He remains with encephalopathy. 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   Clinical Assessment and Goals of Care: I met today at Douglas Stout's bedside. He continues to be encephalopathic and I am concerned about his ability to protect his airway. His brother, Douglas Stout, is at bedside and we discussed his history. Douglas Stout reports that there are 8 siblings total and Douglas Stout has no children. He does not know if Douglas Stout is married currently and shares that Douglas Stout has been married multiple times. Douglas Stout's wife who he was with the longest died ~7 years ago and Douglas Stout believes Douglas Stout's drinking became much worse at that time. He has struggled with alcohol abuse since age 54 and his father also abused alcohol. Douglas Stout expressed frustration that he has attempted multiple times over the years to help Douglas Stout but this has never improved even though he did have periods in time when he was sober once even up to 10 years.   We discussed overall concern for prognosis and that I fear he could get worse instead of better. He has underlying liver cirrhosis which will only worsen and he is cachectic and frail. He is struggling with secretions and I fear that he cannot protect his airway and  Douglas Stout agrees that his breathing does not look good. Upon further discussion Douglas Stout does agree that he does not feel Douglas Stout would desire resuscitation and even says "I think he has wanted to die for a while now." As much as we are trying to do everything to support Kaysin to improve I feel that DNR is appropriate given the acuity of his current illness and underlying health issues that would only lead to declining quality of life and worsening health. I feel that he would be too weak to recover from resuscitation event and even intubation. We did decide for DNR and I will reach out to see if Douglas Stout is legally married to confirm this decision with wife.   I reached out to Douglas Stout who is listed as Douglas Stout's wife a total of 4 times throughout the day without success. In order for surrogate decision makers Douglas Stout has no documented HCPOA, cannot reach wife to verify they are married even, parents are deceased and has no children, siblings would be next in line. I will make Douglas Stout DNR status at this time based on conversation with brother, Douglas Stout. There are 8 siblings total and Douglas Stout is communicating with them. I called  to verify with him and they do not want CPR and they would not want intubation unless there is a chance he can improve. I'm not sure that I can say there is no chance of improvement at this stage so we agree that we will put in place some measures to avoid intubation as much as possible but we would do short trial of intubation as indicated.   All questions/concerns addressed. Emotional support provided. Updated Dr. Wynetta Emery and Caryl Pina, RN. I will follow up tomorrow and discuss further.   Primary Decision Maker Wife? - unable to reach Siblings are next in line (see aboive)    SUMMARY OF RECOMMENDATIONS   -Partial code with short term trial of intubation as indicated  Code Status/Advance Care Planning:  Limited code   Symptom Management:   Secretions: NTS suction as needed.   Albuterol prn - he  is a smoker.   Palliative Prophylaxis:   Aspiration, Bowel Regimen, Delirium Protocol, Frequent Pain Assessment, Oral Care and Turn Reposition  Prognosis:   Overall prognosis guarded.   Discharge Planning: To Be Determined      Primary Diagnoses: Present on Admission: . Hepatic encephalopathy ([place]) . Opioid use disorder . GERD (gastroesophageal reflux disease) . Alcoholism /alcohol abuse . Rhabdomyolysis . Lactic acidosis   I have reviewed the medical record, interviewed the patient and family, and examined the patient. The following aspects are pertinent.  Past Medical History:  Diagnosis Date  . Alcoholism /alcohol abuse ([employer])   . Complication of anesthesia   . Dyspnea   . GERD (gastroesophageal reflux disease)    Social History   Socioeconomic History  . Marital status: Widowed    Spouse name: Not on file  . Number of children: Not on file  . Years of education: Not on file  . Highest education level: Not on file  Occupational History  . Not on file  Tobacco Use  . Smoking status: Current Every Day Smoker    Packs/day: 2.00    Years: 42.00    Pack years: 84.00    Types: Cigarettes  . Smokeless tobacco: Current User    Types: Chew  Substance and Sexual Activity  . Alcohol use: Yes    Comment: daily  . Drug use: No  . Sexual activity: Never    Birth control/protection: None  Other Topics Concern  . Not on file  Social History Narrative  . Not on file   Social Determinants of Health   Financial Resource Strain:   . Difficulty of Paying Living Expenses: Not on file  Food Insecurity:   . Worried About Charity fundraiser in the Last Year: Not on file  . Ran Out of Food in the Last Year: Not on file  Transportation Needs:   . Lack of Transportation (Medical): Not on file  . Lack of Transportation (Non-Medical): Not on file  Physical Activity:   . Days of Exercise per Week: Not on file  . Minutes of Exercise per Session: Not on file  Stress:   .  Feeling of Stress : Not on file  Social Connections:   . Frequency of Communication with Friends and Family: Not on file  . Frequency of Social Gatherings with Friends and Family: Not on file  . Attends Religious Services: Not on file  . Active Member of Clubs or Organizations: Not on file  . Attends Archivist Meetings: Not on file  . Marital Status: Not on file   Family History  Problem Relation Age of Onset  . Cancer Mother   . Cancer Brother    Scheduled Meds: . Chlorhexidine Gluconate Cloth  6 each Topical Daily  . folic acid  1 mg Oral Daily  . lactulose  300 mL Rectal BID  . LORazepam  0-4 mg Intravenous Q8H  . multivitamin with minerals  1 tablet Oral Daily  . pantoprazole (PROTONIX) IV  40 mg Intravenous QHS   Continuous Infusions: . dextrose 5 % and 0.45 % NaCl with KCl 20 mEq/L 50 mL/hr at 05/29/20 0753  . magnesium sulfate bolus IVPB    . piperacillin-tazobactam (ZOSYN)  IV 3.375 g (05/29/20 0520)  . potassium chloride 10 mEq (05/29/20 1000)  . thiamine injection 500 mg (05/28/20 2204)   PRN Meds:.labetalol, ondansetron **OR** ondansetron (ZOFRAN) IV No Known Allergies Review of Systems  Unable to perform ROS: Acuity of condition    Physical Exam Constitutional:      General: He is not in acute distress.    Appearance: He is cachectic. He is ill-appearing.  Cardiovascular:     Rate and Rhythm: Tachycardia present.  Pulmonary:     Effort: Accessory muscle usage present. No tachypnea.     Breath sounds: Rhonchi present.  Abdominal:     General: There is distension.     Palpations: There is fluid wave.  Neurological:     Comments: Only grunts/groans with max stimulation during assessment (lifting arms, pulling off socks). Does not withdraw.      Vital Signs: BP 138/68   Pulse (!) 113   Temp 98.2 F (36.8 C) (Axillary)   Resp (!) 31   Ht _0  (1.727 m)   Wt 60.3 kg   SpO2 99%   BMI 20.21 kg/m  Pain Scale: CPOT POSS *See Group  Information*: 3-INTERVENTION REQUIRED,Unacceptable,Frequently drowsy, arousable, drifts off to sleep during conversation (consult to neurology) Pain Score: 0-No pain   SpO2: SpO2: 99 % O2 Device:SpO2: 99 % O2 Flow Rate: .   IO: Intake/output summary:   Intake/Output Summary (Last 24 hours) at 05/29/2020 1046 Last data filed at 05/28/2020 1703 Gross per 24 hour  Intake 1051.44 ml  Output 450 ml  Net 601.44 ml    LBM: Last BM Date: 05/28/20 Baseline Weight: Weight: 56.7 kg Most recent weight: Weight: 60.3 kg     Palliative Assessment/Data:     Time In: 1100 Time Out: 1230 Time Total: 90 min Greater than 50%  of this time was spent counseling and coordinating care related to the above assessment and plan.  Signed by: Vinie Sill, NP Palliative Medicine Team Pager # [phone] (M-F 8a-5p) Team Phone # [phone] (Nights/Weekends)

## 2020-05-29 NOTE — Progress Notes (Signed)
SLP Cancellation Note  Patient Details Name: DIRK VANAMAN MRN: [id number] DOB: Apr 13, 1956   Cancelled treatment:       Reason Eval/Treat Not Completed: Fatigue/lethargy limiting ability to participate. Pt continues to be somnolent and inappropriate for po/BSE. SLP will sign off, however please re-consult if/when Pt becomes appropriate.  Thank you,  Genene Churn, [place] 05/29/2020, 1:10 PM

## 2020-05-29 NOTE — Progress Notes (Signed)
[place] A. [name], MD     [email or web address]          Douglas Stout is an 64 y.o. male.   ASSESSMENT/PLAN: 1.  Multifactorial toxic metabolic encephalopathy: Etiologies includes hyperammonemia, lactic acidosis, dehydration and acute intoxication from substance of abuse.  Additionally, it is possible that there may have been some ischemic anoxic brain injury. Continue with current supportive care.  2.  Polysubstance drug abuse including alcohol and opioids    Overall the patient appears to be unchanged.  He is somewhat more responsive but still quite stuporous.    GENERAL: He is unkept and appears under nourished.  HEENT: No oral trauma noted.  No other evidence of injury noted.  Neck is supple.  ABDOMEN: soft  EXTREMITIES: No edema   BACK: Normal  SKIN: Normal by inspection.    MENTAL STATUS: He lays in bed with eyes closed.  There is no eye-opening even to painful stimuli.   Today he does answer to his name and states that he does not want to cooperate with the evaluation.  He resistance and states that he wants to be left alone.  CRANIAL NERVES: Pupils are equal, round and reactive to light; extra ocular movements are full -by oculocephalic reflexes; visual fields limited but appears to be full, corneal reflexes are intact; upper and lower facial muscles are normal in strength and symmetric, there is no flattening of the nasolabial folds.  MOTOR: He moves both upper extremities vigorously today resisting the evaluation.  The legs are also antigravity.  No posturing is noted.  COORDINATION: Left finger to nose is normal, right finger to nose is normal, No rest tremor; no intention tremor; no postural tremor; no bradykinesia.  REFLEXES: Deep tendon reflexes are symmetrical and normal.   SENSATION: Normal to pain.        Blood pressure (!) 143/80, pulse (!) 104, temperature 99.3 F (37.4 C), resp. rate (!) 25, height 5\' 8"  (1.727 m), weight  60.3 kg, SpO2 99 %.  Past Medical History:  Diagnosis Date  . Alcoholism /alcohol abuse ([place])   . Complication of anesthesia   . Dyspnea   . GERD (gastroesophageal reflux disease)     Past Surgical History:  Procedure Laterality Date  . BUNIONECTOMY Right   . COLONOSCOPY WITH PROPOFOL N/A 10/30/2016   Procedure: COLONOSCOPY WITH PROPOFOL;  Surgeon: Rogene Houston, MD;  [facility];  Service: Endoscopy;  Laterality: N/A;  7:30  . POLYPECTOMY  10/30/2016   Procedure: POLYPECTOMY;  Surgeon: Rogene Houston, MD;  Location: [facility];  Service: Endoscopy;;  colon    Family History  Problem Relation Age of Onset  . Cancer Mother   . Cancer Brother     Social History:  reports that he has been smoking cigarettes. He has a 84.00 pack-year smoking history. His smokeless tobacco use includes chew. He reports current alcohol use. He reports that he does not use drugs.  Allergies: No Known Allergies  Medications: Prior to Admission medications   Medication Sig Start Date End Date Taking? Authorizing Provider  albuterol (PROVENTIL HFA;VENTOLIN HFA) 108 (90 Base) MCG/ACT inhaler Inhale 1 puff into the lungs every 6 (six) hours as needed for wheezing or shortness of breath.    [provider]  clonazePAM (KLONOPIN) 1 MG tablet Take 1 tablet by mouth daily. 03/23/19   [provider]  oxyCODONE (OXY IR/ROXICODONE) 5 MG immediate release tablet Take 5 mg by mouth 3 (three) times  daily.    [provider]  pantoprazole (PROTONIX) 40 MG tablet Take 40 mg by mouth daily.    [provider]  potassium chloride SA (K-DUR,KLOR-CON) 20 MEQ tablet Take 1 tablet (20 mEq total) by mouth daily. Patient not taking: Reported on 05/26/2020 08/29/18   Julianne Rice, MD  sucralfate (CARAFATE) 1 g tablet Take 1 tablet (1 g total) by mouth 4 (four) times daily -  with meals and at bedtime. Patient not taking: Reported on 05/26/2020 08/29/18   Julianne Rice, MD      Scheduled Meds: . Chlorhexidine Gluconate Cloth  6 each Topical Daily  . folic acid  1 mg Oral Daily  . lactulose  300 mL Rectal BID  . LORazepam  0-4 mg Intravenous Q8H  . multivitamin with minerals  1 tablet Oral Daily  . pantoprazole (PROTONIX) IV  40 mg Intravenous QHS   Continuous Infusions: . dextrose 5 % and 0.45 % NaCl with KCl 20 mEq/L 100 mL/hr at 05/29/20 1048  . piperacillin-tazobactam (ZOSYN)  IV 3.375 g (05/29/20 1435)  . thiamine injection 500 mg (05/29/20 1707)   PRN Meds:.albuterol, labetalol, ondansetron **OR** ondansetron (ZOFRAN) IV     Results for orders placed or performed during the hospital encounter of 05/13/2020 (from the past 48 hour(s))  Magnesium     Status: Abnormal   Collection Time: 05/28/20  5:06 AM  Result Value Ref Range   Magnesium 1.6 (L) 1.7 - 2.4 mg/dL    Comment: Performed at [facility], [address] [place], [place] [address]  Ammonia     Status: Abnormal   Collection Time: 05/28/20  5:06 AM  Result Value Ref Range   Ammonia 63 (H) 9 - 35 umol/L    Comment: Performed at [facility], [address], [place], [place] [address]  CK     Status: None   Collection Time: 05/28/20  5:06 AM  Result Value Ref Range   Total CK 142 49.0 - 397.0 U/L    Comment: Performed at [facility], [address], [place], [place] [address]  Comprehensive metabolic panel     Status: Abnormal   Collection Time: 05/28/20  5:06 AM  Result Value Ref Range   Sodium 145 135 - 145 mmol/L   Potassium 2.7 (LL) 3.5 - 5.1 mmol/L    Comment: CRITICAL RESULT CALLED TO, READ BACK BY AND VERIFIED WITH: WADE,S AT 6:15AM ON 05/28/20 BY FESTERMAN,C    Chloride 123 (H) 98 - 111 mmol/L   CO2 11 (L) 22 - 32 mmol/L   Glucose, Bld 117 (H) 70 - 99 mg/dL    Comment: Glucose reference range applies only to samples taken after fasting for at least 8 hours.   BUN 12 8 - 23 mg/dL   Creatinine, Ser 1.09 0.61 - 1.24 mg/dL   Calcium 7.6 (L) 8.9 - 10.3 mg/dL   Total Protein  6.4 (L) 6.5 - 8.1 g/dL   Albumin 2.1 (L) 3.5 - 5.0 g/dL   AST 64 (H) 15 - 41 U/L   ALT 26 0 - 44 U/L   Alkaline Phosphatase 74 38 - 126 U/L   Total Bilirubin 2.4 (H) 0.3 - 1.2 mg/dL   GFR calc non Af Amer >60 >60 mL/min   GFR calc Af Amer >60 >60 mL/min   Anion gap 11 5 - 15    Comment: Performed at [facility], [address], [place], [place] [address]  Procalcitonin - Baseline     Status: None  Collection Time: 05/28/20  3:06 PM  Result Value Ref Range   Procalcitonin 0.29 ng/mL    Comment:        Interpretation: PCT (Procalcitonin) <= 0.5 ng/mL: Systemic infection (sepsis) is not likely. Local bacterial infection is possible. (NOTE)       Sepsis PCT Algorithm           Lower Respiratory Tract                                      Infection PCT Algorithm    ----------------------------     ----------------------------         PCT < 0.25 ng/mL                PCT < 0.10 ng/mL          Strongly encourage             Strongly discourage   discontinuation of antibiotics    initiation of antibiotics    ----------------------------     -----------------------------       PCT 0.25 - 0.50 ng/mL            PCT 0.10 - 0.25 ng/mL               OR       >80% decrease in PCT            Discourage initiation of                                            antibiotics      Encourage discontinuation           of antibiotics    ----------------------------     -----------------------------         PCT >= 0.50 ng/mL              PCT 0.26 - 0.50 ng/mL               AND        <80% decrease in PCT             Encourage initiation of                                             antibiotics       Encourage continuation           of antibiotics    ----------------------------     -----------------------------        PCT >= 0.50 ng/mL                  PCT > 0.50 ng/mL               AND         increase in PCT                  Strongly encourage                                      initiation of  antibiotics    Strongly encourage escalation  of antibiotics                                     -----------------------------                                           PCT <= 0.25 ng/mL                                                 OR                                        > 80% decrease in PCT                                      Discontinue / Do not initiate                                             antibiotics  Performed at [facility], [address], [place], [place] [address]   RPR     Status: None   Collection Time: 05/28/20  9:07 PM  Result Value Ref Range   RPR Ser Ql NON REACTIVE NON REACTIVE    Comment: Performed at [facility] [address] [place], [place] [address]  Vitamin B12     Status: Abnormal   Collection Time: 05/28/20  9:07 PM  Result Value Ref Range   Vitamin B-12 2,616 (H) 180 - 914 pg/mL    Comment: RESULTS CONFIRMED BY MANUAL DILUTION (NOTE) This assay is not validated for testing neonatal or myeloproliferative syndrome specimens for Vitamin B12 levels. Performed at [facility], [address] [place], [place] [address]   TSH     Status: None   Collection Time: 05/28/20  9:07 PM  Result Value Ref Range   TSH 1.144 0.350 - 4.500 uIU/mL    Comment: Performed by a 3rd Generation assay with a functional sensitivity of <=0.01 uIU/mL. Performed at [facility], [address], [place],  [address]   Comprehensive metabolic panel     Status: Abnormal   Collection Time: 05/29/20  4:30 AM  Result Value Ref Range   Sodium 148 (H) 135 - 145 mmol/L   Potassium 2.7 (LL) 3.5 - 5.1 mmol/L    Comment: CRITICAL RESULT CALLED TO, READ BACK BY AND VERIFIED WITH: W[place]ONER,R AT 6:10AM ON 05/29/20 BY FESTERMAN,C    Chloride 128 (H) 98 - 111 mmol/L   CO2 11 (L) 22 - 32 mmol/L   Glucose, Bld 101 (H) 70 - 99 mg/dL    Comment: Glucose reference range applies only to samples taken after fasting for at least 8 hours.   BUN 13 8 - 23 mg/dL    Creatinine, Ser 1.00 0.61 - 1.24 mg/dL   Calcium 7.9 (L) 8.9 - 10.3 mg/dL   Total Protein 6.6 6.5 - 8.1 g/dL  Albumin 2.0 (L) 3.5 - 5.0 g/dL   AST 57 (H) 15 - 41 U/L   ALT 27 0 - 44 U/L   Alkaline Phosphatase 68 38 - 126 U/L   Total Bilirubin 2.8 (H) 0.3 - 1.2 mg/dL   GFR calc non Af Amer >60 >60 mL/min   GFR calc Af Amer >60 >60 mL/min   Anion gap 9 5 - 15    Comment: Performed at [facility], [address] [place], [place] [address]  CBC     Status: Abnormal   Collection Time: 05/29/20  4:30 AM  Result Value Ref Range   WBC 14.0 (H) 4.0 - 10.5 K/uL   RBC 3.16 (L) 4.22 - 5.81 MIL/uL   Hemoglobin 10.3 (L) 13.0 - 17.0 g/dL   HCT 30.8 (L) 39 - 52 %   MCV 97.5 80.0 - 100.0 fL   MCH 32.6 26.0 - 34.0 pg   MCHC 33.4 30.0 - 36.0 g/dL   RDW 18.4 (H) 11.5 - 15.5 %   Platelets 162 150 - 400 K/uL   nRBC 0.0 0.0 - 0.2 %    Comment: Performed at [facility], [address], [place], [place] [address]  Magnesium     Status: None   Collection Time: 05/29/20  4:30 AM  Result Value Ref Range   Magnesium 1.8 1.7 - 2.4 mg/dL    Comment: Performed at [facility], [address], [place], [place] [address]  CK     Status: None   Collection Time: 05/29/20  4:30 AM  Result Value Ref Range   Total CK 99 49.0 - 397.0 U/L    Comment: Performed at [facility], [address], [place], [place] [address]  Ammonia     Status: Abnormal   Collection Time: 05/29/20  4:30 AM  Result Value Ref Range   Ammonia 67 (H) 9 - 35 umol/L    Comment: Performed at [facility], [address], [place], [place] [address]  Glucose, capillary     Status: Abnormal   Collection Time: 05/29/20 11:22 AM  Result Value Ref Range   Glucose-Capillary 112 (H) 70 - 99 mg/dL    Comment: Glucose reference range applies only to samples taken after fasting for at least 8 hours.  Glucose, capillary     Status: Abnormal   Collection Time: 05/29/20  4:50 PM  Result Value Ref Range   Glucose-Capillary 135 (H) 70 - 99 mg/dL     Comment: Glucose reference range applies only to samples taken after fasting for at least 8 hours.    Studies/Results:  EEG Description: No posterior dominant rhythm was seen. EEG showed continuous generalized 3 to 6 Hz theta-delta slowing.  Hyperventilation and photic stimulation were not performed.      Of note eeg was technically difficult due to significant myogenic artifact.   ABNORMALITY -Continuous slow, generalized   IMPRESSION: This study is  suggestive of moderate to severe diffuse encephalopathy, nonspecific etiology  No seizures or epileptiform discharges were seen throughout the recording.     BRAIN MRI   FINDINGS: Brain:   The examination is intermittently motion degraded, limiting evaluation. Most notably, there is mild/moderate motion degradation of the sagittal T1 weighted sequence, moderate motion degradation of the axial T2/FLAIR sequence, moderate motion degradation of the axial T2* sequence, moderate motion degradation of the axial T1 weighted sequence and moderate motion degradation of the coronal T2 weighted sequence.   Stable, moderate generalized cerebral atrophy which is advanced for age.  Moderate multifocal T2/FLAIR hyperintensity within the cerebral white matter is nonspecific, but most commonly related to chronic small vessel ischemia.   There is no acute infarct.   No evidence of intracranial mass.   No chronic intracranial blood products are identified.   No extra-axial fluid collection.   No midline shift.   Vascular: Expected proximal arterial flow voids.   Skull and upper cervical spine: No focal marrow lesion.   Sinuses/Orbits: Visualized orbits show no acute finding. Mild-to-moderate paranasal sinus mucosal thickening most notably within the right frontal sinus, sphenoid sinuses and left maxillary sinus. Bilateral mastoid effusions (greater on the right).   IMPRESSION: Motion degraded examination as described and limiting  evaluation.   The diffusion-weighted imaging is of good quality. There is no evidence of acute infarct or other acute intracranial abnormality   Moderate multifocal T2 hyperintense signal changes within the cerebral white matter which are nonspecific, but most commonly related to chronic small vessel ischemia.   Moderate generalized cerebral atrophy, advanced for age.   Paranasal sinus mucosal thickening.   Bilateral mastoid effusions.     The brain imaging scan is reviewed and shows no acute changes.  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  There is moderate global atrophy.  No hemorrhage or encephalomalacia is noted.   Brylynn Hanssen A. [name], M.D.  Diplomate, Tax adviser of Psychiatry and Neurology ( Neurology). 05/29/2020, 5:36 PM

## 2020-05-29 NOTE — Progress Notes (Signed)
Critical potassium of 2.7 MD notified via [facility] text page.

## 2020-05-30 ENCOUNTER — Inpatient Hospital Stay (HOSPITAL_COMMUNITY)
Admit: 2020-05-30 | Discharge: 2020-05-30 | Disposition: A | Discharge status: Home / Self Care | Payer: Medicare HMO | Attending: Family Medicine | Admitting: Family Medicine

## 2020-05-30 ENCOUNTER — Inpatient Hospital Stay (HOSPITAL_COMMUNITY): Payer: Medicare HMO

## 2020-05-30 DIAGNOSIS — F1020 Alcohol dependence, uncomplicated: Secondary | ICD-10-CM | POA: Diagnosis not present

## 2020-05-30 DIAGNOSIS — E872 Acidosis: Secondary | ICD-10-CM | POA: Diagnosis not present

## 2020-05-30 DIAGNOSIS — Z7189 Other specified counseling: Secondary | ICD-10-CM | POA: Diagnosis not present

## 2020-05-30 DIAGNOSIS — Z515 Encounter for palliative care: Secondary | ICD-10-CM

## 2020-05-30 DIAGNOSIS — K7290 Hepatic failure, unspecified without coma: Secondary | ICD-10-CM | POA: Diagnosis not present

## 2020-05-30 DIAGNOSIS — K219 Gastro-esophageal reflux disease without esophagitis: Secondary | ICD-10-CM | POA: Diagnosis not present

## 2020-05-30 LAB — GLUCOSE, CAPILLARY
Glucose-Capillary: 129 mg/dL — ABNORMAL HIGH (ref 70–99)
Glucose-Capillary: 134 mg/dL — ABNORMAL HIGH (ref 70–99)
Glucose-Capillary: 136 mg/dL — ABNORMAL HIGH (ref 70–99)
Glucose-Capillary: 145 mg/dL — ABNORMAL HIGH (ref 70–99)
Glucose-Capillary: 161 mg/dL — ABNORMAL HIGH (ref 70–99)
Glucose-Capillary: 181 mg/dL — ABNORMAL HIGH (ref 70–99)

## 2020-05-30 LAB — CBC
HCT: 28.2 % — ABNORMAL LOW (ref 39.0–52.0)
Hemoglobin: 9.9 g/dL — ABNORMAL LOW (ref 13.0–17.0)
MCH: 32.9 pg (ref 26.0–34.0)
MCHC: 35.1 g/dL (ref 30.0–36.0)
MCV: 93.7 fL (ref 80.0–100.0)
Platelets: 136 10*3/uL — ABNORMAL LOW (ref 150–400)
RBC: 3.01 MIL/uL — ABNORMAL LOW (ref 4.22–5.81)
RDW: 18.3 % — ABNORMAL HIGH (ref 11.5–15.5)
WBC: 13.7 10*3/uL — ABNORMAL HIGH (ref 4.0–10.5)
nRBC: 0 % (ref 0.0–0.2)

## 2020-05-30 LAB — COMPREHENSIVE METABOLIC PANEL
ALT: 26 U/L (ref 0–44)
AST: 53 U/L — ABNORMAL HIGH (ref 15–41)
Albumin: 1.7 g/dL — ABNORMAL LOW (ref 3.5–5.0)
Alkaline Phosphatase: 63 U/L (ref 38–126)
BUN: 15 mg/dL (ref 8–23)
CO2: 13 mmol/L — ABNORMAL LOW (ref 22–32)
Calcium: 7.6 mg/dL — ABNORMAL LOW (ref 8.9–10.3)
Chloride: >130 mmol/L (ref 98–111)
Creatinine, Ser: 1 mg/dL (ref 0.61–1.24)
GFR calc Af Amer: >60 mL/min (ref >60)
GFR calc non Af Amer: >60 mL/min (ref >60)
Glucose, Bld: 149 mg/dL — ABNORMAL HIGH (ref 70–99)
Potassium: 3.1 mmol/L — ABNORMAL LOW (ref 3.5–5.1)
Sodium: 150 mmol/L — ABNORMAL HIGH (ref 135–145)
Total Bilirubin: 1.8 mg/dL — ABNORMAL HIGH (ref 0.3–1.2)
Total Protein: 5.7 g/dL — ABNORMAL LOW (ref 6.5–8.1)

## 2020-05-30 LAB — CULTURE, BLOOD (ROUTINE X 2)
Culture: NO GROWTH
Culture: NO GROWTH
Special Requests: ADEQUATE
Special Requests: ADEQUATE

## 2020-05-30 LAB — BASIC METABOLIC PANEL
Anion gap: 9 (ref 5–15)
BUN: 14 mg/dL (ref 8–23)
CO2: 10 mmol/L — ABNORMAL LOW (ref 22–32)
Calcium: 7.6 mg/dL — ABNORMAL LOW (ref 8.9–10.3)
Chloride: 131 mmol/L (ref 98–111)
Creatinine, Ser: 1.01 mg/dL (ref 0.61–1.24)
GFR calc Af Amer: >60 mL/min (ref >60)
GFR calc non Af Amer: >60 mL/min (ref >60)
Glucose, Bld: 179 mg/dL — ABNORMAL HIGH (ref 70–99)
Potassium: 3 mmol/L — ABNORMAL LOW (ref 3.5–5.1)
Sodium: 150 mmol/L — ABNORMAL HIGH (ref 135–145)

## 2020-05-30 LAB — HOMOCYSTEINE: Homocysteine: 17.5 umol/L — ABNORMAL HIGH (ref 0.0–17.2)

## 2020-05-30 LAB — MAGNESIUM: Magnesium: 2.4 mg/dL (ref 1.7–2.4)

## 2020-05-30 MED ORDER — POTASSIUM CHLORIDE 10 MEQ/100ML IV SOLN
10.0000 meq | INTRAVENOUS | Status: AC
  Administered 2020-05-30 (×4): 10 meq via INTRAVENOUS
  Filled 2020-05-30 (×3): qty 100

## 2020-05-30 MED ORDER — LORAZEPAM 2 MG/ML IJ SOLN
1.0000 mg | INTRAMUSCULAR | Status: DC | PRN
  Administered 2020-05-30: 1 mg via INTRAVENOUS
  Filled 2020-05-30: qty 1

## 2020-05-30 MED ORDER — POTASSIUM CL IN DEXTROSE 5% 20 MEQ/L IV SOLN: 20.0000 meq | INTRAVENOUS | Status: DC

## 2020-05-30 MED ORDER — POTASSIUM CHLORIDE 10 MEQ/100ML IV SOLN: 10.0000 meq | INTRAVENOUS | Status: DC

## 2020-05-30 MED ORDER — POTASSIUM CL IN DEXTROSE 5% 20 MEQ/L IV SOLN
20.0000 meq | INTRAVENOUS | Status: DC
  Administered 2020-05-30 – 2020-05-31 (×3): 20 meq via INTRAVENOUS

## 2020-05-30 MED ORDER — IPRATROPIUM-ALBUTEROL 0.5-2.5 (3) MG/3ML IN SOLN
3.0000 mL | RESPIRATORY_TRACT | Status: DC
  Administered 2020-05-30 (×5): 3 mL via RESPIRATORY_TRACT
  Filled 2020-05-30: qty 3
  Filled 2020-05-30: qty 6
  Filled 2020-05-30 (×2): qty 3

## 2020-05-30 MED ORDER — METHYLPREDNISOLONE SODIUM SUCC 40 MG IJ SOLR
40.0000 mg | Freq: Four times a day (QID) | INTRAMUSCULAR | Status: DC
  Administered 2020-05-30 – 2020-05-31 (×5): 40 mg via INTRAVENOUS
  Filled 2020-05-30 (×5): qty 1

## 2020-05-30 NOTE — Progress Notes (Signed)
Present with Douglas Stout, Mr Dolata's brother for emotional and spiritual support. He shared family history along with the complicated relationship with his brother due to his long term alcohol abuse, other factors. They are 11 months apart and he has been a strong support for Finley throughout the years. We talk about the regrets/grief/Grace of his relationship and with that of Caylor's other family members. He has 2 stepchildren also.

## 2020-05-30 NOTE — Progress Notes (Addendum)
PROGRESS NOTE    Douglas Stout  LOV:[id number] DOB: Mar 07, 1956 DOA: 05/23/2020 PCP: Lucia Gaskins, MD   Brief Narrative:  Per HPI: Douglas Stout a 64 y.o.malewith a history of hypertension, alcoholism, GERD. Patient is hypersomnolent so HPI obtained from EDP and medical records. Patient brought to the emergency department due to hypersomnolence. By report, the patient is alcoholic and crushes his narcotics and takes them intranasally. He and her friend were doing this last night. The wife found him on the floor this morning. He remained on the floor for the majority of the day and he remained unresponsive. His wife finally called EMS around 4 PM. Patient found to have elevated ammonia level, elevated CK. Imaging normal. Patient started IV fluids and given a lactulose rectal enema.  9/26:Patient was admitted with acute multifactorial encephalopathy with noted elevated ammonia levels in the setting of polysubstance abuse with alcohol as well as narcotics. He is also noted to have rhabdomyolysis.He continues to remain encephalopathic this morning.  9/27: Patient still continues to remain quite encephalopathic this morning.  This is quite unusual and therefore EEG will be obtained with concern for possible alcohol withdrawal seizures.  May need to consider brain MRI if no significant findings noted.  Continue potassium supplementation as he is still quite hypokalemic.  9/28: Patient is still remains quite encephalopathic this morning.  EEG negative for seizure activity.  Plan to pursue brain MRI today for further evaluation.  Continue ongoing potassium supplementation as well as magnesium supplementation as ordered.  Chest x-ray ordered for further evaluation as well.  9/29: pt remains encephalopathic.  Lactic acid remains elevated.  Spoke with brother at bedside.  Palliative medicine consult requested.    9/30: Pt having increasing respiratory distress, brother consented to  trial of bipap therapy but no escalation, no meaningful improvement in mentation.  After family discussions with palliative care patient is now DNR/DNI.  If he fails to have any meaningful improvement after bipap trial would want to transition to comfort measures starting 10/1.   Assessment & Plan:   Principal Problem:   Hepatic encephalopathy (HCC) Active Problems:   GERD (gastroesophageal reflux disease)   Alcoholism /alcohol abuse   Opioid use disorder   Rhabdomyolysis   Lactic acidosis   Palliative care by specialist   Acute toxic metabolic encephalopathy-multifactorial.  -Urine drug screen and alcohol levels negative on admission -Ammonia levels remain elevated at 63, plan to schedule lactulose enemas as he has been taking p.o. and monitor in a.m. -Right upper quadrant ultrasoundwith cirrhosis and moderate ascites -Keep n.p.o. with SLP evaluation ordered and pending, once able.  Continue gentle IV fluid -CT head with no acute findings and some left-sided scalp trauma, but without hematoma. -Brain MRI 9/28 no acute findings -Neurology consult 9/28: continue supportive care and repeat EEG ordered 9/30 - Prognosis remains guarded as patient is not having any meaningful recovery with maximal medical treatments -Neurology concerned patient suffered anoxic brain injury    Severe hypokalemia/hypomagnesemia -IV repletion continued,  Follow   Hypernatremia - Adding more free water,  D5W ordered 9/30, repeat BMP later today  Rhabdomyolysis-nontraumatic-resolved -Treated with gentle IV fluid -Repeat CK level decreasing, will monitor -No sign of significant AKI  Lactic acidosis -Resolved with IV fluid-continue -No evidence of infection and likely secondary to rhabdomyolysis -Continue to monitor  Opiate use disorder -Patient will require substance abuse counseling -UDS negative  Alcoholism -Maintain on alcohol withdrawal protocol -He is receiving high dose IV thiamine  treatments x 3 day  course  Severe Protein Calorie Malnutrition  - Palliative care team discussing with family options regarding wishes for nutrition - he is high risk for refeeding syndrome   GERD -Continue on Protonix IV daily  DVT prophylaxis:SCDs Code Status:Full Family Communication: brother at bedside updated 9/29, 9/30, unable to reach spouse Disposition Plan:TBD Status is: Inpatient  Remains inpatient appropriate because:Altered mental status, IV treatments appropriate due to intensity of illness or inability to take PO and Inpatient level of care appropriate due to severity of illness   Dispo: The patient is from:Home Anticipated d/c is to:SNF Anticipated d/c date is: 2-3 days pending palliative discussions Patient currently is not medically stable to d/c.Patient continues to remain quite encephalopathic and is not stable for discharge.   Continue lactulose enemas and potassium supplementation.  Palliative medicine consultation requested 9/29.    Consultants:  Neurology  Palliative medicine  Procedures:  See below  Antimicrobials:   None  Subjective: Patient having spontaneous vocalizations, he remains encephalopathic, he is having more respiratory distress and likely has aspirated again  Objective: Vitals:   05/30/20 0400 05/30/20 0500 05/30/20 0600 05/30/20 0800  BP: 128/61 (!) 150/58 (!) 145/64   Pulse: (!) 102 (!) 101 99   Resp: 20 (!) 23 (!) 22   Temp: 98 F (36.7 C)   97.6 F (36.4 C)  TempSrc: Axillary   Axillary  SpO2: 100% 100% 100%   Weight:  60.3 kg    Height:        Intake/Output Summary (Last 24 hours) at 05/30/2020 1011 Last data filed at 05/30/2020 2831 Gross per 24 hour  Intake 3051.71 ml  Output 1200 ml  Net 1851.71 ml   Filed Weights   05/27/20 0500 05/28/20 0500 05/30/20 0500  Weight: 60.7 kg 60.3 kg 60.3 kg   Examination:  General exam: chronically ill appearing,  encephalopathic, appears older than stated age, spontaneous vocalizations Respiratory system: diffuse expiratory wheezing bilaterally heard, diminished BS on right side.  Cardiovascular system: normal S1 & S2 heard.  Gastrointestinal system: Abdomen is nondistended, soft and nontender.  Central nervous system: Unresponsive and somnolent Extremities: no clubbing, no cyanosis, trace pretibial edema BLEs.  Skin: multiple large tattoos Psychiatry: Unable to assess  Data Reviewed: I have personally reviewed following labs and imaging studies  CBC: Recent Labs  Lab 05/17/2020 1717 05/03/2020 1717 05/27/2020 2230 05/26/20 0915 05/27/20 0707 05/29/20 0430 05/30/20 0359  WBC 10.5   < > 10.5 12.6* 10.9* 14.0* 13.7*  NEUTROABS 8.2*  --   --   --   --   --   --   HGB 10.6*   < > 9.3* 10.6* 9.6* 10.3* 9.9*  HCT 29.7*   < > 26.3* 30.2* 27.6* 30.8* 28.2*  MCV 94.0   < > 94.6 94.7 95.2 97.5 93.7  PLT 183   < > 147* 175 168 162 136*   < > = values in this interval not displayed.   Basic Metabolic Panel: Recent Labs  Lab 05/16/2020 2230 05/26/20 0534 05/26/20 1435 05/26/20 1435 05/27/20 0707 05/27/20 1648 05/28/20 0506 05/29/20 0430 05/30/20 0359  NA 136   < > 136   < > 142 142 145 148* 150*  K 2.1*   < > 2.3*   < > 2.5* 3.2* 2.7* 2.7* 3.1*  CL 113*   < > 112*   < > 117* 121* 123* 128* >130*  CO2 13*   < > 13*   < > 13* 11* 11* 11* 13*  GLUCOSE  113*   < > 90   < > 99 110* 117* 101* 149*  BUN 12   < > 12   < > 11 11 12 13 15   CREATININE 1.13   < > 1.01   < > 1.05 1.07 1.09 1.00 1.00  CALCIUM 7.4*   < > 7.6*   < > 7.4* 7.5* 7.6* 7.9* 7.6*  MG 1.4*  --  2.1  --  1.7  --  1.6* 1.8 2.4  PHOS 2.0*  --   --   --   --   --   --   --   --    < > = values in this interval not displayed.   GFR: Estimated Creatinine Clearance: 62.8 mL/min (by C-G formula based on SCr of 1 mg/dL). Liver Function Tests: Recent Labs  Lab 05/21/2020 1717 05/26/20 0534 05/28/20 0506 05/29/20 0430 05/30/20 0359  AST  104* 84* 64* 57* 53*  ALT 31 26 26 27 26   ALKPHOS 91 73 74 68 63  BILITOT 3.8* 2.7* 2.4* 2.8* 1.8*  PROT 7.3 6.2* 6.4* 6.6 5.7*  ALBUMIN 2.5* 2.1* 2.1* 2.0* 1.7*   Recent Labs  Lab 05/11/2020 1717  LIPASE 33   Recent Labs  Lab 05/14/2020 1717 05/26/20 0535 05/28/20 0506 05/29/20 0430  AMMONIA 128* 40* 63* 67*   Coagulation Profile: Recent Labs  Lab 05/30/2020 1831 05/26/20 0534  INR 1.8* 2.0*   Cardiac Enzymes: Recent Labs  Lab 05/22/2020 1831 05/26/20 0534 05/27/20 0707 05/28/20 0506 05/29/20 0430  CKTOTAL 646* 562* 258 142 99   BNP (last 3 results) No results for input(s): PROBNP in the last 8760 hours. HbA1C: No results for input(s): HGBA1C in the last 72 hours. CBG: Recent Labs  Lab 05/29/20 1650 05/29/20 2022 05/30/20 0008 05/30/20 0349 05/30/20 0743  GLUCAP 135* 132* 129* 134* 136*   Lipid Profile: No results for input(s): CHOL, HDL, LDLCALC, TRIG, CHOLHDL, LDLDIRECT in the last 72 hours. Thyroid Function Tests: Recent Labs    05/28/20 2107  TSH 1.144   Anemia Panel: Recent Labs    05/28/20 2107  VITAMINB12 2,616*   Sepsis Labs: Recent Labs  Lab 05/15/2020 1717 05/07/2020 2230 05/28/20 1506  PROCALCITON  --   --  0.29  LATICACIDVEN 4.3* 1.5  --     Recent Results (from the past 240 hour(s))  Resp Panel by RT PCR (RSV, Flu A&B, Covid) - Nasopharyngeal Swab     Status: None   Collection Time: 05/27/2020  4:58 PM   Specimen: Nasopharyngeal Swab  Result Value Ref Range Status   SARS Coronavirus 2 by RT PCR NEGATIVE NEGATIVE Final    Comment: (NOTE) SARS-CoV-2 target nucleic acids are NOT DETECTED.  The SARS-CoV-2 RNA is generally detectable in upper respiratoy specimens during the acute phase of infection. The lowest concentration of SARS-CoV-2 viral copies this assay can detect is 131 copies/mL. A negative result does not preclude SARS-Cov-2 infection and should not be used as the sole basis for treatment or other patient management  decisions. A negative result may occur with  improper specimen collection/handling, submission of specimen other than nasopharyngeal swab, presence of viral mutation(s) within the areas targeted by this assay, and inadequate number of viral copies (<131 copies/mL). A negative result must be combined with clinical observations, patient history, and epidemiological information. The expected result is Negative.  Fact Sheet for Patients:  PinkCheek.be  Fact Sheet for Healthcare Providers:  GravelBags.it  This test is no t yet approved  or cleared by the [place] and  has been authorized for detection and/or diagnosis of SARS-CoV-2 by FDA under an Emergency Use Authorization (EUA). This EUA will remain  in effect (meaning this test can be used) for the duration of the COVID-19 declaration under Section 564(b)(1) of the Act, 21 U.S.C. section 360bbb-3(b)(1), unless the authorization is terminated or revoked sooner.     Influenza A by PCR NEGATIVE NEGATIVE Final   Influenza B by PCR NEGATIVE NEGATIVE Final    Comment: (NOTE) The Xpert Xpress SARS-CoV-2/FLU/RSV assay is intended as an aid in  the diagnosis of influenza from Nasopharyngeal swab specimens and  should not be used as a sole basis for treatment. Nasal washings and  aspirates are unacceptable for Xpert Xpress SARS-CoV-2/FLU/RSV  testing.  Fact Sheet for Patients: PinkCheek.be  Fact Sheet for Healthcare Providers: GravelBags.it  This test is not yet approved or cleared by the [place] FDA and  has been authorized for detection and/or diagnosis of SARS-CoV-2 by  FDA under an Emergency Use Authorization (EUA). This EUA will remain  in effect (meaning this test can be used) for the duration of the  Covid-19 declaration under Section 564(b)(1) of the Act, 21  U.S.C. section 360bbb-3(b)(1), unless the  authorization is  terminated or revoked.    Respiratory Syncytial Virus by PCR NEGATIVE NEGATIVE Final    Comment: (NOTE) Fact Sheet for Patients: PinkCheek.be  Fact Sheet for Healthcare Providers: GravelBags.it  This test is not yet approved or cleared by the [place] FDA and  has been authorized for detection and/or diagnosis of SARS-CoV-2 by  FDA under an Emergency Use Authorization (EUA). This EUA will remain  in effect (meaning this test can be used) for the duration of the  COVID-19 declaration under Section 564(b)(1) of the Act, 21 U.S.C.  section 360bbb-3(b)(1), unless the authorization is terminated or  revoked. Performed at [facility], [address] [place], [place] [address]   Urine culture     Status: None   Collection Time: 05/18/2020  5:51 PM   Specimen: In/Out Cath Urine  Result Value Ref Range Status   Specimen Description   Final    IN/OUT CATH URINE Performed at [facility], [address] [place], [place] [address]    Special Requests   Final    NONE Performed at [facility], [address] [place], [place] [address]    Culture   Final    NO GROWTH Performed at [facility], [place] [address] [place], [place] [address]    Report Status 05/27/2020 FINAL  Final  Blood Culture (routine x 2)     Status: None   Collection Time: 05/10/2020  6:32 PM   Specimen: Right Antecubital; Blood  Result Value Ref Range Status   Specimen Description RIGHT ANTECUBITAL  Final   Special Requests   Final    BOTTLES DRAWN AEROBIC ONLY Blood Culture adequate volume   Culture   Final    NO GROWTH 5 DAYS Performed at [facility], [address], [place], [place] [address]    Report Status 05/30/2020 FINAL  Final  Blood Culture (routine x 2)     Status: None   Collection Time: 05/02/2020  6:40 PM   Specimen: BLOOD RIGHT HAND  Result Value Ref Range Status   Specimen Description BLOOD RIGHT HAND  Final    Special Requests   Final    BOTTLES DRAWN AEROBIC ONLY Blood Culture adequate volume   Culture  Final    NO GROWTH 5 DAYS Performed at [facility], [address], [place], [place] [address]    Report Status 05/30/2020 FINAL  Final  MRSA PCR Screening     Status: None   Collection Time: 05/26/20  3:02 PM   Specimen: Nasal Mucosa; Nasopharyngeal  Result Value Ref Range Status   MRSA by PCR NEGATIVE NEGATIVE Final    Comment:        The GeneXpert MRSA Assay (FDA approved for NASAL specimens only), is one component of a comprehensive MRSA colonization surveillance program. It is not intended to diagnose MRSA infection nor to guide or monitor treatment for MRSA infections. Performed at [facility], [address] [place], [place] [address]     Radiology Studies: DG Chest 1 View  Result Date: 05/28/2020 CLINICAL DATA:  Wheezing, shortness of breath. EXAM: CHEST  1 VIEW COMPARISON:  May 25, 2020. FINDINGS: The heart size and mediastinal contours are within normal limits. No pneumothorax is noted. Right lung is clear. New left upper lobe airspace opacity is noted concerning for pneumonia. Possible small left pleural effusion may be present. The visualized skeletal structures are unremarkable. IMPRESSION: New left upper lobe airspace opacity concerning for pneumonia. Possible small left pleural effusion may be present. Followup PA and lateral chest X-ray is recommended in 3-4 weeks following trial of antibiotic therapy to ensure resolution and exclude underlying malignancy. Electronically Signed   By: Marijo Conception M.D.   On: 05/28/2020 13:38   MR BRAIN WO CONTRAST  Result Date: 05/28/2020 CLINICAL DATA:  Mental status change, unknown cause. Additional history provided: Altered mental status for 2 days. EXAM: MRI HEAD WITHOUT CONTRAST TECHNIQUE: Multiplanar, multiecho pulse sequences of the brain and surrounding structures were obtained without intravenous contrast. COMPARISON:   Head CT 05/22/2020. FINDINGS: Brain: The examination is intermittently motion degraded, limiting evaluation. Most notably, there is mild/moderate motion degradation of the sagittal T1 weighted sequence, moderate motion degradation of the axial T2/FLAIR sequence, moderate motion degradation of the axial T2* sequence, moderate motion degradation of the axial T1 weighted sequence and moderate motion degradation of the coronal T2 weighted sequence. Stable, moderate generalized cerebral atrophy which is advanced for age. Moderate multifocal T2/FLAIR hyperintensity within the cerebral white matter is nonspecific, but most commonly related to chronic small vessel ischemia. There is no acute infarct. No evidence of intracranial mass. No chronic intracranial blood products are identified. No extra-axial fluid collection. No midline shift. Vascular: Expected proximal arterial flow voids. Skull and upper cervical spine: No focal marrow lesion. Sinuses/Orbits: Visualized orbits show no acute finding. Mild-to-moderate paranasal sinus mucosal thickening most notably within the right frontal sinus, sphenoid sinuses and left maxillary sinus. Bilateral mastoid effusions (greater on the right). IMPRESSION: Motion degraded examination as described and limiting evaluation. The diffusion-weighted imaging is of good quality. There is no evidence of acute infarct or other acute intracranial abnormality Moderate multifocal T2 hyperintense signal changes within the cerebral white matter which are nonspecific, but most commonly related to chronic small vessel ischemia. Moderate generalized cerebral atrophy, advanced for age. Paranasal sinus mucosal thickening. Bilateral mastoid effusions. Electronically Signed   By: Kellie Simmering DO   On: 05/28/2020 12:31   DG CHEST PORT 1 VIEW  Result Date: 05/30/2020 CLINICAL DATA:  Metabolic encephalopathy.  Rapid breathing. EXAM: PORTABLE CHEST 1 VIEW COMPARISON:  05/28/2020. FINDINGS: Mediastinum and  hilar structures are unremarkable. Heart size normal. New prominent infiltrate right upper lung. Persistent unchanged infiltrate left upper lung. No pleural  effusion or pneumothorax. Postsurgical changes left clavicle again noted. No acute bony abnormality. IMPRESSION: New prominent infiltrate right upper lung. Persistent unchanged infiltrate left upper lung. Electronically Signed   By: Marcello Moores  Register   On: 05/30/2020 05:26   Scheduled Meds: . Chlorhexidine Gluconate Cloth  6 each Topical Daily  . folic acid  1 mg Oral Daily  . ipratropium-albuterol  3 mL Nebulization Q4H  . lactulose  300 mL Rectal BID  . methylPREDNISolone (SOLU-MEDROL) injection  40 mg Intravenous Q6H  . multivitamin with minerals  1 tablet Oral Daily  . pantoprazole (PROTONIX) IV  40 mg Intravenous QHS   Continuous Infusions: . dextrose 5 % with KCl 20 mEq / L    . piperacillin-tazobactam (ZOSYN)  IV 12.5 mL/hr at 05/30/20 0657  . potassium chloride 10 mEq (05/30/20 0850)  . thiamine injection Stopped (05/29/20 2215)     LOS: 5 days   Critical Care Procedure Note Authorized and Performed by: Murvin Natal MD  Total Critical Care time:  33 mins Due to a high probability of clinically significant, life threatening deterioration, the patient required my highest level of preparedness to intervene emergently and I personally spent this critical care time directly and personally managing the patient.  This critical care time included obtaining a history; examining the patient, pulse oximetry; ordering and review of studies; arranging urgent treatment with development of a management plan; evaluation of patient's response of treatment; frequent reassessment; and discussions with other providers.  This critical care time was performed to assess and manage the high probability of imminent and life threatening deterioration that could result in multi-organ failure.  It was exclusive of separately billable procedures and treating  other patients and teaching time.    Irwin Brakeman, MD How to contact the [facility] Attending or Consulting provider [place] or covering provider during after hours [place], for this patient?  1. Check the care team in [facility] and look for a) attending/consulting [facility] provider listed and b) the [facility] team listed 2. Log into [email or web address] and use [place]'s universal password to access. If you do not have the password, please contact the hospital operator. 3. Locate the [facility] provider you are looking for under [facility] and page to a number that you can be directly reached. 4. If you still have difficulty reaching the provider, please page the [facility] (Director on Call) for the Hospitalists listed on amion for assistance.  [facility]  If 7PM-7AM, please contact night-coverage [email or web address] 05/30/2020, 10:11 AM

## 2020-05-30 NOTE — Progress Notes (Signed)
Spoke with RN to see if he is available for EEG she is checking with Doctor to see if he still wants EEG

## 2020-05-30 NOTE — Progress Notes (Signed)
Palliative:  HPI: 64 y.o. male  with past medical history of hypertension, GERD, alcohol and opioid abuse admitted on 05/19/2020 with unresponsive at home and found to have elevated ammonia with diagnosis of cirrhosis with moderate ascites, rhabdomyolysis, and concern for alcohol withdraw. EEG negative for seizures and MRI with no acute findings but with significant microvascular changes and moderate global atrophy. He remains with encephalopathy. 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   I met today at Kaisen's bedside. He continues to be obtunded and only withdraws to pain. He does grunt/moan during exam. He does not open eyes or follow commands. Breathing is concerning with accessory muscle use as well as tachypnea and lungs sound very tight with poor air movement. Discussed concerns with brother, Konrad Dolores, at bedside. Konrad Dolores also shares that Rollie smokes 3 packs of cigarettes per day since a teenager as well. Dr. Wynetta Emery and I discussed concern for hypercarbia adding to decline and agree to give trial of BiPAP. We also discussed that we will not intubate given status of lung function he would be very unlikely to extubate successfully and Tommy agrees with plan.   I was able to call and speak with wife, Morey Hummingbird. Morey Hummingbird shares that Wei has been drinking more than she realized and she did not immediately call EMS because he often passes out at home due to alcohol consumption. She did call when he did not arouse. Morey Hummingbird also shares with me that Rivaldo has not been eating and she tries to get him to eat one meal a day but he usually only takes a few bites. She also notes that he has nebulizer but does not use this. She reports that she has tried to encourage him to take medications to help loosen his phlegm but he won't comply. Given his poor intake, frail state, poor neurological status I fear that prognosis is poor and fear he does not have the reserve or strength to recover from this illness as he was  failing prior to admission. Morey Hummingbird understands and confirms that he would never want to be resuscitated and has told her so and also has shared that he would want to be cremated. We discussed plan to continue interventions today and if worse or no improvement by tomorrow we will consider full comfort care.   All questions/concerns addressed. Emotional support provided.   Exam: Obtunded. Withdraws to painful stimuli. Does not open eyes or follow any commands. Breathing with accessory muscles and very poor air movement noted on auscultation. Abd soft but with ascites. Warm to touch.   Plan: - Trial of BiPAP for likely hypercarbia. Continue current interventions today. For decline or lack of improvement consideration for full comfort measures tomorrow.   [place], NP Palliative Medicine Team Pager [phone] (Please see [email or web address] for schedule) Team Phone [phone]    Greater than 50%  of this time was spent counseling and coordinating care related to the above assessment and plan

## 2020-05-30 NOTE — Progress Notes (Signed)
EEG complete - results pending 

## 2020-05-31 DIAGNOSIS — K7290 Hepatic failure, unspecified without coma: Secondary | ICD-10-CM | POA: Diagnosis not present

## 2020-05-31 DIAGNOSIS — G9340 Encephalopathy, unspecified: Secondary | ICD-10-CM | POA: Diagnosis not present

## 2020-05-31 DIAGNOSIS — Z7189 Other specified counseling: Secondary | ICD-10-CM | POA: Diagnosis not present

## 2020-05-31 DIAGNOSIS — Z515 Encounter for palliative care: Secondary | ICD-10-CM | POA: Diagnosis not present

## 2020-05-31 LAB — COMPREHENSIVE METABOLIC PANEL
ALT: 26 U/L (ref 0–44)
AST: 48 U/L — ABNORMAL HIGH (ref 15–41)
Albumin: 1.8 g/dL — ABNORMAL LOW (ref 3.5–5.0)
Alkaline Phosphatase: 68 U/L (ref 38–126)
BUN: 15 mg/dL (ref 8–23)
CO2: 11 mmol/L — ABNORMAL LOW (ref 22–32)
Calcium: 7.7 mg/dL — ABNORMAL LOW (ref 8.9–10.3)
Chloride: >130 mmol/L (ref 98–111)
Creatinine, Ser: 0.95 mg/dL (ref 0.61–1.24)
GFR calc Af Amer: >60 mL/min (ref >60)
GFR calc non Af Amer: >60 mL/min (ref >60)
Glucose, Bld: 171 mg/dL — ABNORMAL HIGH (ref 70–99)
Potassium: 2.5 mmol/L — CL (ref 3.5–5.1)
Sodium: 149 mmol/L — ABNORMAL HIGH (ref 135–145)
Total Bilirubin: 1.5 mg/dL — ABNORMAL HIGH (ref 0.3–1.2)
Total Protein: 6 g/dL — ABNORMAL LOW (ref 6.5–8.1)

## 2020-05-31 LAB — CBC
HCT: 27.5 % — ABNORMAL LOW (ref 39.0–52.0)
Hemoglobin: 9.7 g/dL — ABNORMAL LOW (ref 13.0–17.0)
MCH: 33.1 pg (ref 26.0–34.0)
MCHC: 35.3 g/dL (ref 30.0–36.0)
MCV: 93.9 fL (ref 80.0–100.0)
Platelets: 128 10*3/uL — ABNORMAL LOW (ref 150–400)
RBC: 2.93 MIL/uL — ABNORMAL LOW (ref 4.22–5.81)
RDW: 18.8 % — ABNORMAL HIGH (ref 11.5–15.5)
WBC: 18.2 10*3/uL — ABNORMAL HIGH (ref 4.0–10.5)
nRBC: 0 % (ref 0.0–0.2)

## 2020-05-31 LAB — MAGNESIUM: Magnesium: 1.8 mg/dL (ref 1.7–2.4)

## 2020-05-31 LAB — GLUCOSE, CAPILLARY
Glucose-Capillary: 126 mg/dL — ABNORMAL HIGH (ref 70–99)
Glucose-Capillary: 129 mg/dL — ABNORMAL HIGH (ref 70–99)
Glucose-Capillary: 153 mg/dL — ABNORMAL HIGH (ref 70–99)
Glucose-Capillary: 168 mg/dL — ABNORMAL HIGH (ref 70–99)

## 2020-05-31 MED ORDER — MORPHINE 100MG IN NS 100ML (1MG/ML) PREMIX INFUSION
1.0000 mg/h | INTRAVENOUS | Status: DC
  Administered 2020-05-31: 1 mg/h via INTRAVENOUS
  Filled 2020-05-31: qty 100

## 2020-05-31 MED ORDER — POLYVINYL ALCOHOL 1.4 % OP SOLN: 1.0000 [drp] | Freq: Four times a day (QID) | OPHTHALMIC | Status: DC | PRN

## 2020-05-31 MED ORDER — GLYCOPYRROLATE 1 MG PO TABS: 1.0000 mg | ORAL_TABLET | ORAL | Status: DC | PRN

## 2020-05-31 MED ORDER — GLYCOPYRROLATE 0.2 MG/ML IJ SOLN: 0.2000 mg | INTRAMUSCULAR | Status: DC | PRN

## 2020-05-31 MED ORDER — IPRATROPIUM-ALBUTEROL 0.5-2.5 (3) MG/3ML IN SOLN
3.0000 mL | Freq: Four times a day (QID) | RESPIRATORY_TRACT | Status: DC
  Administered 2020-05-31: 3 mL via RESPIRATORY_TRACT

## 2020-05-31 MED ORDER — BIOTENE DRY MOUTH MT LIQD: 15.0000 mL | OROMUCOSAL | Status: DC | PRN

## 2020-05-31 MED ORDER — MORPHINE BOLUS VIA INFUSION
1.0000 mg | INTRAVENOUS | Status: DC | PRN
  Filled 2020-05-31: qty 1

## 2020-05-31 MED ORDER — IPRATROPIUM-ALBUTEROL 0.5-2.5 (3) MG/3ML IN SOLN
3.0000 mL | Freq: Four times a day (QID) | RESPIRATORY_TRACT | Status: DC
  Administered 2020-05-31 – 2020-06-01 (×3): 3 mL via RESPIRATORY_TRACT
  Filled 2020-05-31 (×3): qty 3

## 2020-05-31 MED ORDER — POTASSIUM CL IN DEXTROSE 5% 20 MEQ/L IV SOLN
20.0000 meq | INTRAVENOUS | Status: DC
  Administered 2020-05-31: 20 meq via INTRAVENOUS

## 2020-05-31 NOTE — TOC Initial Note (Signed)
Transition of Care [facility]) - Initial/Assessment Note    Patient Details  Name: Douglas Stout MRN: [id number] Date of Birth: Mar 25, 1956  Transition of Care [facility]) CM/SW Contact:    Boneta Lucks, RN Phone Number: 05/31/2020, 2:55 PM  Clinical Narrative:      MD doing palliative consult. Morey Hummingbird wife and brother are requesting Hospice facility. TOC spoke with Morey Hummingbird, she is requesting [facility]. TOC referred to [facility], they have no beds available.  RN will come to assess and Hospice will let us know when we can make patient GIP until bed is available. RN and MD updated.             Expected Discharge Plan: Hospice Medical Facility Barriers to Discharge: Other (comment) (GIP)   Patient Goals and CMS Choice Patient states their goals for this hospitalization and ongoing recovery are:: to go to [facility] Medicare.gov Compare Post Acute Care list provided to:: Patient Represenative (must comment) Choice offered to / list presented to : Spouse  Expected Discharge Plan and Services Expected Discharge Plan: [place]       Living arrangements for the past 2 months: [place] Agency: [facility] Date [facility] Agency Contacted: 05/31/20 Time HH Agency Contacted: 52 Representative spoke with at [place]: [place] Arrangements/Services Living arrangements for the past 2 months: [place] with:: Self          Need for Family Participation in Patient Care: Yes (Comment) Care giver support system in place?: No (comment)   Criminal Activity/Legal Involvement Pertinent to Current Situation/Hospitalization: No - Comment as needed  Activities of Daily Living Home Assistive Devices/Equipment: Cane (specify quad or straight) ADL Screening (condition at time of admission) Patient's cognitive ability adequate to safely complete daily activities?: No Is the patient deaf or have difficulty  hearing?: No Does the patient have difficulty seeing, even when wearing glasses/contacts?: No Does the patient have difficulty concentrating, remembering, or making decisions?: Yes Patient able to express need for assistance with ADLs?: No Does the patient have difficulty dressing or bathing?: Yes Independently performs ADLs?: No Communication: Independent Dressing (OT): Dependent Is this a change from baseline?: Change from baseline, expected to last <3days Grooming: Dependent Is this a change from baseline?: Change from baseline, expected to last <3 days Feeding: Dependent Is this a change from baseline?: Change from baseline, expected to last <3 days Bathing: Dependent Is this a change from baseline?: Change from baseline, expected to last <3 days Toileting: Dependent Is this a change from baseline?: Change from baseline, expected to last <3 days In/Out Bed: Dependent Is this a change from baseline?: Change from baseline, expected to last <3 days Walks in Home: Independent with device (comment) Does the patient have difficulty walking or climbing stairs?: Yes Weakness of Legs: Both Weakness of Arms/Hands: None  Permission Sought/Granted Permission sought to share information with : Case Manager    Share Information with NAME: Douglas Stout     Permission granted to share info w Relationship: Wife     Emotional Assessment         Alcohol / Substance Use: Alcohol Use, Illicit Drugs Psych Involvement: No (comment)  Admission diagnosis:  Hepatic encephalopathy ([place]) [K72.90] Hyperammonemia ([place]) [E72.20] Encephalopathy [G93.40] Elevated LFTs [R79.89] Patient Active Problem List   Diagnosis Date Noted  . Palliative care by specialist   .  Hepatic encephalopathy ([place]) 05/24/2020  . Opioid use disorder 05/24/2020  . Rhabdomyolysis 05/19/2020  . Lactic acidosis 05/08/2020  . GERD (gastroesophageal reflux disease)   . Alcoholism /alcohol abuse    PCP:  Lucia Gaskins,  MD Pharmacy:   [facility], [address] [place] [address] Phone: [phone] Fax: [phone]

## 2020-05-31 NOTE — Progress Notes (Addendum)
05/31/2020 9:51 AM  Progress Note  Per HPI: Douglas Stout a 64 y.o.malewith a history of hypertension, alcoholism, GERD. Patient is hypersomnolent so HPI obtained from EDP and medical records. Patient brought to the emergency department due to hypersomnolence. By report, the patient is alcoholic and crushes his narcotics and takes them intranasally. He and her friend were doing this last night. The wife found him on the floor this morning. He remained on the floor for the majority of the day and he remained unresponsive. His wife finally called EMS around 4 PM. Patient found to have elevated ammonia level, elevated CK. Imaging normal. Patient started IV fluids and given a lactulose rectal enema.  9/26:Patient was admitted with acute multifactorial encephalopathy with noted elevated ammonia levels in the setting of polysubstance abuse with alcohol as well as narcotics. He is also noted to have rhabdomyolysis.He continues to remain encephalopathic this morning.  9/27:Patient still continues to remain quite encephalopathic this morning. This is quite unusual and therefore EEG will be obtained with concern for possible alcohol withdrawal seizures. May need to consider brain MRI if no significant findings noted. Continue potassium supplementation as he is still quite hypokalemic.  9/28: Patient is still remains quite encephalopathic this morning.  EEG negative for seizure activity.  Plan to pursue brain MRI today for further evaluation.  Continue ongoing potassium supplementation as well as magnesium supplementation as ordered.  Chest x-ray ordered for further evaluation as well.  9/29: pt remains encephalopathic.  Lactic acid remains elevated.  Spoke with brother at bedside.  Palliative medicine consult requested.    9/30: Pt having increasing respiratory distress, brother consented to trial of bipap therapy but no escalation, no meaningful improvement in mentation.  After family  discussions with palliative care patient is now DNR/DNI.  If he fails to have any meaningful improvement after bipap trial would want to transition to comfort measures starting 10/1.   10/1:  Pt having no meaningful improvements.  Labs are worse today.  I spoke with patient's brother at bedside and explained the situation to him and he agreed that we should not prolong suffering and focus on comfort.  This was patient's wife's wishes as expressed to me by palliative care team.  I tried to call spouse but no answer.  Will start comfort measures and unrestricted visitation. I asked social worker to offer family the option of residential hospice.   ** Update:  I was able to speak with wife and she verbalized understanding and agreement to proceed with full comfort care measures.    Time spent: 35 mins    Murvin Natal  MD  How to contact the [facility] Attending or Consulting provider [place] or covering provider during after hours [place], for this patient?  1. Check the care team in [facility] and look for a) attending/consulting TRH provider listed and b) the [facility] team listed 2. Log into [email or web address] and use [place]'s universal password to access. If you do not have the password, please contact the hospital operator. 3. Locate the [facility] provider you are looking for under [facility] and page to a number that you can be directly reached. 4. If you still have difficulty reaching the provider, please page the [facility] (Director on Call) for the Hospitalists listed on amion for assistance.

## 2020-05-31 NOTE — Procedures (Signed)
Patient Name: Douglas Stout  MRN: [id number]  Epilepsy Attending: Lora Havens  Referring Physician/Provider: Dr. Irwin Brakeman Date: 05/30/2020 Duration: 26.50 minutes  Patient history: 49 old male with altered mental status.  EEG to evaluate for seizures.  Level of alertness: Awake, asleep  AEDs during EEG study: Ativan  Technical aspects: This EEG study was done with scalp electrodes positioned according to the 10-20 International system of electrode placement. Electrical activity was acquired at a sampling rate of 500Hz  and reviewed with a high frequency filter of 70Hz  and a low frequency filter of 1Hz . EEG data were recorded continuously and digitally stored.   Description: During awake state, no clear posterior dominant rhythm was seen.  Sleep was characterized by vertex waves, sleep spindles (12 to 14 Hz), maximal frontocentral region.  EEG showed continuous generalized 3 to 6 Hz theta-delta slowing.  Hyperventilation and photic stimulation were not performed.     ABNORMALITY -Continuous slow, generalized  IMPRESSION: This study is suggestive of moderate diffuse encephalopathy, nonspecific etiology. No seizures or epileptiform discharges were seen throughout the recording.  [name]

## 2020-05-31 NOTE — Care Management Important Message (Signed)
Important Message  Patient Details  Name: Douglas Stout MRN: [id number] Date of Birth: July 13, 1956   Medicare Important Message Given:  Yes     Tommy Medal 05/31/2020, 2:59 PM

## 2020-05-31 NOTE — Procedures (Signed)
EEG is reviewed in person and shows severe slowing 5-6 hertz throughout with the low change in background but no epileptiform activities.

## 2020-05-31 DEATH — deceased

## 2020-06-01 DIAGNOSIS — K7290 Hepatic failure, unspecified without coma: Secondary | ICD-10-CM | POA: Diagnosis not present

## 2020-06-01 DIAGNOSIS — Z7189 Other specified counseling: Secondary | ICD-10-CM | POA: Diagnosis not present

## 2020-06-01 DIAGNOSIS — Z515 Encounter for palliative care: Secondary | ICD-10-CM | POA: Diagnosis not present

## 2020-06-01 NOTE — Progress Notes (Signed)
Report called to 300 nurse.  Patient transferred to room via bed.

## 2020-06-01 NOTE — Progress Notes (Signed)
06/01/2020 11:39 AM  Progress Note  Per HPI: Douglas Latorre Clarkis a 64 y.o.malewith a history of hypertension, alcoholism, GERD. Patient is hypersomnolent so HPI obtained from EDP and medical records. Patient brought to the emergency department due to hypersomnolence. By report, the patient is alcoholic and crushes his narcotics and takes them intranasally. He and her friend were doing this last night. The wife found him on the floor this morning. He remained on the floor for the majority of the day and he remained unresponsive. His wife finally called EMS around 4 PM. Patient found to have elevated ammonia level, elevated CK. Imaging normal. Patient started IV fluids and given a lactulose rectal enema.  9/26:Patient was admitted with acute multifactorial encephalopathy with noted elevated ammonia levels in the setting of polysubstance abuse with alcohol as well as narcotics. He is also noted to have rhabdomyolysis.He continues to remain encephalopathic this morning.  9/27:Patient still continues to remain quite encephalopathic this morning. This is quite unusual and therefore EEG will be obtained with concern for possible alcohol withdrawal seizures. May need to consider brain MRI if no significant findings noted. Continue potassium supplementation as he is still quite hypokalemic.  9/28: Patient is still remains quite encephalopathic this morning.  EEG negative for seizure activity.  Plan to pursue brain MRI today for further evaluation.  Continue ongoing potassium supplementation as well as magnesium supplementation as ordered.  Chest x-ray ordered for further evaluation as well.  9/29: pt remains encephalopathic.  Lactic acid remains elevated.  Spoke with brother at bedside.  Palliative medicine consult requested.    9/30: Pt having increasing respiratory distress, brother consented to trial of bipap therapy but no escalation, no meaningful improvement in mentation.  After  family discussions with palliative care patient is now DNR/DNI.  If he fails to have any meaningful improvement after bipap trial would want to transition to comfort measures starting 10/1.   10/1:  Pt having no meaningful improvements.  Labs are worse today.  I spoke with patient's brother at bedside and explained the situation to him and he agreed that we should not prolong suffering and focus on comfort.  This was patient's wife's wishes as expressed to me by palliative care team.  I tried to call spouse but no answer.  Will start comfort measures and unrestricted visitation. I asked social worker to offer family the option of residential hospice.   ** Update:  I was able to speak with wife on 10/1 and she verbalized understanding and agreement to proceed with full comfort care measures.    10/2: No beds at residential hospice. Pt remains on full comfort care measures and on IV morphine infusion.  Pt appears comfortable and no respiratory distress.     Time spent: 30 mins   Murvin Natal  MD  How to contact the [facility] Attending or Consulting provider [place] or covering provider during after hours [place], for this patient?  1. Check the care team in [facility] and look for a) attending/consulting TRH provider listed and b) the [facility] team listed 2. Log into [email or web address] and use [place]'s universal password to access. If you do not have the password, please contact the hospital operator. 3. Locate the [facility] provider you are looking for under [facility] and page to a number that you can be directly reached. 4. If you still have difficulty reaching the provider, please page the [facility] (Director on Call) for the Hospitalists listed on amion for assistance.

## 2020-06-02 DIAGNOSIS — K7290 Hepatic failure, unspecified without coma: Secondary | ICD-10-CM | POA: Diagnosis not present

## 2020-06-02 DIAGNOSIS — F1190 Opioid use, unspecified, uncomplicated: Secondary | ICD-10-CM | POA: Diagnosis not present

## 2020-06-02 DIAGNOSIS — E872 Acidosis: Secondary | ICD-10-CM | POA: Diagnosis not present

## 2020-06-02 DIAGNOSIS — R7989 Other specified abnormal findings of blood chemistry: Secondary | ICD-10-CM

## 2020-06-02 DIAGNOSIS — K219 Gastro-esophageal reflux disease without esophagitis: Secondary | ICD-10-CM | POA: Diagnosis not present

## 2020-06-03 ENCOUNTER — Encounter (HOSPITAL_COMMUNITY): Payer: Self-pay | Admitting: Family Medicine

## 2020-06-03 DIAGNOSIS — R7989 Other specified abnormal findings of blood chemistry: Secondary | ICD-10-CM

## 2020-06-03 DIAGNOSIS — G9340 Encephalopathy, unspecified: Secondary | ICD-10-CM

## 2020-06-05 LAB — METHYLMALONIC ACID, SERUM: Methylmalonic Acid, Quantitative: 162 nmol/L (ref 0–378)

## 2020-07-01 NOTE — Death Summary Note (Signed)
DEATH SUMMARY   Patient Details  Name: Douglas Stout MRN: [id number] DOB: 10-Apr-1956  Admission/Discharge Information   Admit Date:  Jun 20, 2020  Date of Death: Date of Death: 06-28-2020  Time of Death: Time of Death: December 16, 1921  Length of Stay: 8  Referring Physician: Lucia Gaskins, MD   Reason(s) for Hospitalization  ADMISSION HPI: Douglas Stout is a 64 y.o. male with a history of hypertension, alcoholism, GERD.  Patient is hypersomnolent so HPI obtained from EDP and medical records.  Patient brought to the emergency department due to hypersomnolence.  By report, the patient is alcoholic and crushes his narcotics and takes them intranasally.  He and her friend were doing this last night.  The wife found him on the floor this morning.  He remained on the floor for the majority of the day and he remained unresponsive.  His wife finally called EMS around 4 PM.  Patient found to have elevated ammonia level, elevated CK.  Imaging normal.  Patient started IV fluids and given a lactulose rectal enema.  Diagnoses  Preliminary cause of death:  Secondary Diagnoses (including complications and co-morbidities):  Principal Problem:   Toxic metabolic encephalopathy    Hepatic encephalopathy  Active Problems:   GERD (gastroesophageal reflux disease)   Alcoholism /alcohol abuse   Opioid use disorder   Rhabdomyolysis   Lactic acidosis   Palliative care by specialist  Brief Hospital Course (including significant findings, care, treatment, and services provided and events leading to death)  Per HPI: Douglas Stout a 64 y.o.malewith a history of hypertension, alcoholism, GERD. Patient is hypersomnolent so HPI obtained from EDP and medical records. Patient brought to the emergency department due to hypersomnolence. By report, the patient is alcoholic and crushes his narcotics and takes them intranasally. He and her friend were doing this last night. The wife found him on the floor this morning. He  remained on the floor for the majority of the day and he remained unresponsive. His wife finally called EMS around 4 PM. Patient found to have elevated ammonia level, elevated CK. Imaging normal. Patient started IV fluids and given a lactulose rectal enema.  9/26:Patient was admitted with acute multifactorial encephalopathy with noted elevated ammonia levels in the setting of polysubstance abuse with alcohol as well as narcotics. He is also noted to have rhabdomyolysis.He continues to remain encephalopathic this morning.  9/27:Patient still continues to remain quite encephalopathic this morning. This is quite unusual and therefore EEG will be obtained with concern for possible alcohol withdrawal seizures. May need to consider brain MRI if no significant findings noted. Continue potassium supplementation as he is still quite hypokalemic.  9/28: Patient is still remains quite encephalopathic this morning. EEG negative for seizure activity. Plan to pursue brain MRI today for further evaluation. Continue ongoing potassium supplementation as well as magnesium supplementation as ordered. Chest x-ray ordered for further evaluation as well.  9/29: pt remains encephalopathic. Lactic acid remains elevated. Spoke with brother at bedside. Palliative medicine consult requested.   9/30: Pt having increasing respiratory distress, brother consented to trial of bipap therapy but no escalation, no meaningful improvement in mentation.After family discussions with palliative care patient is now DNR/DNI. If he fails to have any meaningful improvement after bipap trial would want to transition to comfort measures starting 10/1.   10/1:  Pt having no meaningful improvements.  Labs are worse today.  I spoke with patient's brother at bedside and explained the situation to him and he agreed that we should  not prolong suffering and focus on comfort.  This was patient's wife's wishes as expressed to me  by palliative care team.  I tried to call spouse but no answer.  Will start comfort measures and unrestricted visitation. I asked social worker to offer family the option of residential hospice.   Update:  I was able to speak with wife on 10/1 and she verbalized understanding and agreement to proceed with full comfort care measures.    10/2: No beds at residential hospice. Pt remains on full comfort care measures and on IV morphine infusion.  Pt appears comfortable and no respiratory distress.   10/3:  Pt remains on full comfort care measures and appears comfortable on IV morphine infusion.  Pt expired at 1923.    Pertinent Labs and Studies  Significant Diagnostic Studies DG Chest 1 View  Result Date: 05/28/2020 CLINICAL DATA:  Wheezing, shortness of breath. EXAM: CHEST  1 VIEW COMPARISON:  May 25, 2020. FINDINGS: The heart size and mediastinal contours are within normal limits. No pneumothorax is noted. Right lung is clear. New left upper lobe airspace opacity is noted concerning for pneumonia. Possible small left pleural effusion may be present. The visualized skeletal structures are unremarkable. IMPRESSION: New left upper lobe airspace opacity concerning for pneumonia. Possible small left pleural effusion may be present. Followup PA and lateral chest X-ray is recommended in 3-4 weeks following trial of antibiotic therapy to ensure resolution and exclude underlying malignancy. Electronically Signed   By: Marijo Conception M.D.   On: 05/28/2020 13:38   CT HEAD WO CONTRAST  Result Date: 05/12/2020 CLINICAL DATA:  Delirium, altered mental status after snorting oxycodone and drinking alcohol EXAM: CT HEAD WITHOUT CONTRAST TECHNIQUE: Contiguous axial images were obtained from the base of the skull through the vertex without intravenous contrast. COMPARISON:  CT 04/08/2019 FINDINGS: Motion degradation of the initial images requiring rescanning of the skull base and posterior fossa with  satisfactory second acquisition. Brain: No evidence of acute infarction, hemorrhage, hydrocephalus, extra-axial collection, visible mass lesion or mass effect. Symmetric prominence of the ventricles, cisterns and sulci compatible with parenchymal volume loss. Patchy areas of white matter hypoattenuation are most compatible with chronic microvascular angiopathy. Vascular: Atherosclerotic calcification of the carotid siphons. No hyperdense vessel. Skull: Mild left parieto-occipital scalp stranding and thickening (3/63, 2/26). No large hematoma. No subjacent calvarial fracture. No acute or suspicious osseous lesions. Sinuses/Orbits: Mural thickening throughout the ethmoid, sphenoid and maxillary sinuses. Layering air-fluid levels and pneumatized secretions in the frontal sinuses. Orbital structures are unremarkable aside from prior lens extractions. Other: None. IMPRESSION: 1. No acute intracranial abnormality. 2. Mild left parieto-occipital scalp stranding and thickening without large hematoma. No subjacent calvarial fracture. Correlate for point tenderness/contusive change. 3. Chronic microvascular angiopathy and parenchymal volume loss. 4. Layering air-fluid levels and pneumatized secretions in the frontal sinuses. May correspond with patient's recent substance use versus an acute sinusitis. Electronically Signed   By: Lovena Le M.D.   On: 05/26/2020 19:42   MR BRAIN WO CONTRAST  Result Date: 05/28/2020 CLINICAL DATA:  Mental status change, unknown cause. Additional history provided: Altered mental status for 2 days. EXAM: MRI HEAD WITHOUT CONTRAST TECHNIQUE: Multiplanar, multiecho pulse sequences of the brain and surrounding structures were obtained without intravenous contrast. COMPARISON:  Head CT 05/12/2020. FINDINGS: Brain: The examination is intermittently motion degraded, limiting evaluation. Most notably, there is mild/moderate motion degradation of the sagittal T1 weighted sequence, moderate motion  degradation of the axial T2/FLAIR sequence, moderate motion degradation  of the axial T2* sequence, moderate motion degradation of the axial T1 weighted sequence and moderate motion degradation of the coronal T2 weighted sequence. Stable, moderate generalized cerebral atrophy which is advanced for age. Moderate multifocal T2/FLAIR hyperintensity within the cerebral white matter is nonspecific, but most commonly related to chronic small vessel ischemia. There is no acute infarct. No evidence of intracranial mass. No chronic intracranial blood products are identified. No extra-axial fluid collection. No midline shift. Vascular: Expected proximal arterial flow voids. Skull and upper cervical spine: No focal marrow lesion. Sinuses/Orbits: Visualized orbits show no acute finding. Mild-to-moderate paranasal sinus mucosal thickening most notably within the right frontal sinus, sphenoid sinuses and left maxillary sinus. Bilateral mastoid effusions (greater on the right). IMPRESSION: Motion degraded examination as described and limiting evaluation. The diffusion-weighted imaging is of good quality. There is no evidence of acute infarct or other acute intracranial abnormality Moderate multifocal T2 hyperintense signal changes within the cerebral white matter which are nonspecific, but most commonly related to chronic small vessel ischemia. Moderate generalized cerebral atrophy, advanced for age. Paranasal sinus mucosal thickening. Bilateral mastoid effusions. Electronically Signed   By: Kellie Simmering DO   On: 05/28/2020 12:31   DG CHEST PORT 1 VIEW  Result Date: 05/30/2020 CLINICAL DATA:  Metabolic encephalopathy.  Rapid breathing. EXAM: PORTABLE CHEST 1 VIEW COMPARISON:  05/28/2020. FINDINGS: Mediastinum and hilar structures are unremarkable. Heart size normal. New prominent infiltrate right upper lung. Persistent unchanged infiltrate left upper lung. No pleural effusion or pneumothorax. Postsurgical changes left clavicle  again noted. No acute bony abnormality. IMPRESSION: New prominent infiltrate right upper lung. Persistent unchanged infiltrate left upper lung. Electronically Signed   By: Marcello Moores  Register   On: 05/30/2020 05:26   DG Chest Port 1 View  Result Date: 05/27/2020 CLINICAL DATA:  Altered mental status. EXAM: PORTABLE CHEST 1 VIEW COMPARISON:  April 08, 2019 FINDINGS: Cardiomediastinal silhouette is normal. Mediastinal contours appear intact. Calcific atherosclerotic disease of the aorta. There is no evidence of focal airspace consolidation, pleural effusion or pneumothorax. Osseous structures are without acute abnormality. Soft tissues are grossly normal. IMPRESSION: No active disease. Electronically Signed   By: Fidela Salisbury M.D.   On: 05/22/2020 18:58   EEG adult  Result Date: 05/31/2020 Lora Havens, MD     05/31/2020  1:02 PM Patient Name: GABINO HAGIN MRN: [id number] Epilepsy Attending: Lora Havens Referring Physician/Provider: Dr. Irwin Brakeman Date: 05/30/2020 Duration: 26.50 minutes Patient history: 52 old male with altered mental status.  EEG to evaluate for seizures. Level of alertness: Awake, asleep AEDs during EEG study: Ativan Technical aspects: This EEG study was done with scalp electrodes positioned according to the 10-20 International system of electrode placement. Electrical activity was acquired at a sampling rate of 500Hz  and reviewed with a high frequency filter of 70Hz  and a low frequency filter of 1Hz . EEG data were recorded continuously and digitally stored. Description: During awake state, no clear posterior dominant rhythm was seen.  Sleep was characterized by vertex waves, sleep spindles (12 to 14 Hz), maximal frontocentral region.  EEG showed continuous generalized 3 to 6 Hz theta-delta slowing.  Hyperventilation and photic stimulation were not performed.   ABNORMALITY -Continuous slow, generalized IMPRESSION: This study is suggestive of moderate diffuse encephalopathy,  nonspecific etiology. No seizures or epileptiform discharges were seen throughout the recording. Lora Havens   EEG adult  Result Date: 05/27/2020 Lora Havens, MD     05/27/2020  4:13 PM Patient Name: Lesslie Mckeehan  Hattabaugh MRN: [id number] Epilepsy Attending: Lora Havens Referring Physician/Provider: Dr Heath Lark Date: 05/27/2020 Duration: 23.06 min Patient history: 64yo M with ams. EEG to evaluate for seizure. Level of alertness: comatose/ lethargic AEDs during EEG study: None Technical aspects: This EEG study was done with scalp electrodes positioned according to the 10-20 International system of electrode placement. Electrical activity was acquired at a sampling rate of 500Hz  and reviewed with a high frequency filter of 70Hz  and a low frequency filter of 1Hz . EEG data were recorded continuously and digitally stored. Description: No posterior dominant rhythm was seen. EEG showed continuous generalized 3 to 6 Hz theta-delta slowing.  Hyperventilation and photic stimulation were not performed.   Of note eeg was technically difficult due to significant myogenic artifact. ABNORMALITY -Continuous slow, generalized IMPRESSION: This study is  suggestive of moderate to severe diffuse encephalopathy, nonspecific etiology  No seizures or epileptiform discharges were seen throughout the recording. Lora Havens   US Abdomen Limited RUQ  Result Date: 05/26/2020 CLINICAL DATA:  Elevated liver function tests EXAM: ULTRASOUND ABDOMEN LIMITED RIGHT UPPER QUADRANT COMPARISON:  Abdominal CT 10/17/2018 FINDINGS: Gallbladder: Gallbladder wall thickening which is likely reactive. No gallstone. The technologist could not assess for Murphy sign due to mental status. Common bile duct: Diameter: 2 mm Liver: Known cirrhosis with heterogeneous and lobulated liver. Moderate ascites is seen in the right upper quadrant with pocket measuring up to 3.2 cm ventral to the liver. Portal vein is patent on color Doppler imaging with  normal direction of blood flow towards the liver. IMPRESSION: Cirrhosis with moderate ascites similar to a 2020 abdominal CT. Electronically Signed   By: Monte Fantasia M.D.   On: 05/26/2020 10:34    Microbiology Recent Results (from the past 240 hour(s))  Resp Panel by RT PCR (RSV, Flu A&B, Covid) - Nasopharyngeal Swab     Status: None   Collection Time: 05/27/2020  4:58 PM   Specimen: Nasopharyngeal Swab  Result Value Ref Range Status   SARS Coronavirus 2 by RT PCR NEGATIVE NEGATIVE Final    Comment: (NOTE) SARS-CoV-2 target nucleic acids are NOT DETECTED.  The SARS-CoV-2 RNA is generally detectable in upper respiratoy specimens during the acute phase of infection. The lowest concentration of SARS-CoV-2 viral copies this assay can detect is 131 copies/mL. A negative result does not preclude SARS-Cov-2 infection and should not be used as the sole basis for treatment or other patient management decisions. A negative result may occur with  improper specimen collection/handling, submission of specimen other than nasopharyngeal swab, presence of viral mutation(s) within the areas targeted by this assay, and inadequate number of viral copies (<131 copies/mL). A negative result must be combined with clinical observations, patient history, and epidemiological information. The expected result is Negative.  Fact Sheet for Patients:  PinkCheek.be  Fact Sheet for Healthcare Providers:  GravelBags.it  This test is no t yet approved or cleared by the [place] FDA and  has been authorized for detection and/or diagnosis of SARS-CoV-2 by FDA under an Emergency Use Authorization (EUA). This EUA will remain  in effect (meaning this test can be used) for the duration of the COVID-19 declaration under Section 564(b)(1) of the Act, 21 U.S.C. section 360bbb-3(b)(1), unless the authorization is terminated or revoked sooner.     Influenza  A by PCR NEGATIVE NEGATIVE Final   Influenza B by PCR NEGATIVE NEGATIVE Final    Comment: (NOTE) The Xpert Xpress SARS-CoV-2/FLU/RSV assay is intended as an aid in  the  diagnosis of influenza from Nasopharyngeal swab specimens and  should not be used as a sole basis for treatment. Nasal washings and  aspirates are unacceptable for Xpert Xpress SARS-CoV-2/FLU/RSV  testing.  Fact Sheet for Patients: PinkCheek.be  Fact Sheet for Healthcare Providers: GravelBags.it  This test is not yet approved or cleared by the [place] FDA and  has been authorized for detection and/or diagnosis of SARS-CoV-2 by  FDA under an Emergency Use Authorization (EUA). This EUA will remain  in effect (meaning this test can be used) for the duration of the  Covid-19 declaration under Section 564(b)(1) of the Act, 21  U.S.C. section 360bbb-3(b)(1), unless the authorization is  terminated or revoked.    Respiratory Syncytial Virus by PCR NEGATIVE NEGATIVE Final    Comment: (NOTE) Fact Sheet for Patients: PinkCheek.be  Fact Sheet for Healthcare Providers: GravelBags.it  This test is not yet approved or cleared by the [place] FDA and  has been authorized for detection and/or diagnosis of SARS-CoV-2 by  FDA under an Emergency Use Authorization (EUA). This EUA will remain  in effect (meaning this test can be used) for the duration of the  COVID-19 declaration under Section 564(b)(1) of the Act, 21 U.S.C.  section 360bbb-3(b)(1), unless the authorization is terminated or  revoked. Performed at [facility], [address] [place], [place] [address]   Urine culture     Status: None   Collection Time: 05/14/2020  5:51 PM   Specimen: In/Out Cath Urine  Result Value Ref Range Status   Specimen Description   Final    IN/OUT CATH URINE Performed at [facility], [address],  [place], [place] [address]    Special Requests   Final    NONE Performed at [facility], [address], [place], [place] [address]    Culture   Final    NO GROWTH Performed at [facility], [address], [place], [place] [address]    Report Status 05/27/2020 FINAL  Final  Blood Culture (routine x 2)     Status: None   Collection Time: 05/09/2020  6:32 PM   Specimen: Right Antecubital; Blood  Result Value Ref Range Status   Specimen Description RIGHT ANTECUBITAL  Final   Special Requests   Final    BOTTLES DRAWN AEROBIC ONLY Blood Culture adequate volume   Culture   Final    NO GROWTH 5 DAYS Performed at [facility], [address], [place], [place] [address]    Report Status 05/30/2020 FINAL  Final  Blood Culture (routine x 2)     Status: None   Collection Time: 05/28/2020  6:40 PM   Specimen: BLOOD RIGHT HAND  Result Value Ref Range Status   Specimen Description BLOOD RIGHT HAND  Final   Special Requests   Final    BOTTLES DRAWN AEROBIC ONLY Blood Culture adequate volume   Culture   Final    NO GROWTH 5 DAYS Performed at [facility], [address], [place], [place] [address]    Report Status 05/30/2020 FINAL  Final  MRSA PCR Screening     Status: None   Collection Time: 05/26/20  3:02 PM   Specimen: Nasal Mucosa; Nasopharyngeal  Result Value Ref Range Status   MRSA by PCR NEGATIVE NEGATIVE Final    Comment:        The GeneXpert MRSA Assay (FDA approved for NASAL specimens only), is one component of a comprehensive MRSA colonization surveillance program. It is not intended  to diagnose MRSA infection nor to guide or monitor treatment for MRSA infections. Performed at [facility], [address], [place], [place] [address]     Lab Basic Metabolic Panel: Recent Labs  Lab 05/28/20 0506 05/29/20 0430 05/30/20 0359 05/30/20 1358 05/31/20 0419  NA 145 148* 150* 150* 149*  K 2.7* 2.7* 3.1* 3.0* 2.5*  CL 123* 128* >130* 131* >130*  CO2 11* 11* 13* 10* 11*   GLUCOSE 117* 101* 149* 179* 171*  BUN 12 13 15 14 15   CREATININE 1.09 1.00 1.00 1.01 0.95  CALCIUM 7.6* 7.9* 7.6* 7.6* 7.7*  MG 1.6* 1.8 2.4  --  1.8   Liver Function Tests: Recent Labs  Lab 05/28/20 0506 05/29/20 0430 05/30/20 0359 05/31/20 0419  AST 64* 57* 53* 48*  ALT 26 27 26 26   ALKPHOS 74 68 63 68  BILITOT 2.4* 2.8* 1.8* 1.5*  PROT 6.4* 6.6 5.7* 6.0*  ALBUMIN 2.1* 2.0* 1.7* 1.8*   No results for input(s): LIPASE, AMYLASE in the last 168 hours. Recent Labs  Lab 05/28/20 0506 05/29/20 0430  AMMONIA 63* 67*   CBC: Recent Labs  Lab 05/29/20 0430 05/30/20 0359 05/31/20 0419  WBC 14.0* 13.7* 18.2*  HGB 10.3* 9.9* 9.7*  HCT 30.8* 28.2* 27.5*  MCV 97.5 93.7 93.9  PLT 162 136* 128*   Cardiac Enzymes: Recent Labs  Lab 05/28/20 0506 05/29/20 0430  CKTOTAL 142 99   Sepsis Labs: Recent Labs  Lab 05/28/20 1506 05/29/20 0430 05/30/20 0359 05/31/20 0419  PROCALCITON 0.29  --   --   --   WBC  --  14.0* 13.7* 18.2*   Mara Favero MD  06/03/2020, 10:14 AM

## 2020-07-01 NOTE — Progress Notes (Signed)
Wasted 30mg  of morphine, unable to verify in pyxis. Verified by Santa Lighter, RN.

## 2020-07-01 NOTE — Progress Notes (Signed)
June 16, 2020 10:28 AM  Progress Note  Per HPI: Douglas Bonneau Clarkis a 64 y.o.malewith a history of hypertension, alcoholism, GERD. Patient is hypersomnolent so HPI obtained from EDP and medical records. Patient brought to the emergency department due to hypersomnolence. By report, the patient is alcoholic and crushes his narcotics and takes them intranasally. He and her friend were doing this last night. The wife found him on the floor this morning. He remained on the floor for the majority of the day and he remained unresponsive. His wife finally called EMS around 4 PM. Patient found to have elevated ammonia level, elevated CK. Imaging normal. Patient started IV fluids and given a lactulose rectal enema.  9/26:Patient was admitted with acute multifactorial encephalopathy with noted elevated ammonia levels in the setting of polysubstance abuse with alcohol as well as narcotics. He is also noted to have rhabdomyolysis.He continues to remain encephalopathic this morning.  9/27:Patient still continues to remain quite encephalopathic this morning. This is quite unusual and therefore EEG will be obtained with concern for possible alcohol withdrawal seizures. May need to consider brain MRI if no significant findings noted. Continue potassium supplementation as he is still quite hypokalemic.  9/28: Patient is still remains quite encephalopathic this morning.  EEG negative for seizure activity.  Plan to pursue brain MRI today for further evaluation.  Continue ongoing potassium supplementation as well as magnesium supplementation as ordered.  Chest x-ray ordered for further evaluation as well.  9/29: pt remains encephalopathic.  Lactic acid remains elevated.  Spoke with brother at bedside.  Palliative medicine consult requested.    9/30: Pt having increasing respiratory distress, brother consented to trial of bipap therapy but no escalation, no meaningful improvement in mentation.  After  family discussions with palliative care patient is now DNR/DNI.  If he fails to have any meaningful improvement after bipap trial would want to transition to comfort measures starting 10/1.   10/1:  Pt having no meaningful improvements.  Labs are worse today.  I spoke with patient's brother at bedside and explained the situation to him and he agreed that we should not prolong suffering and focus on comfort.  This was patient's wife's wishes as expressed to me by palliative care team.  I tried to call spouse but no answer.  Will start comfort measures and unrestricted visitation. I asked social worker to offer family the option of residential hospice.   ** Update:  I was able to speak with wife on 10/1 and she verbalized understanding and agreement to proceed with full comfort care measures.    10/2: No beds at residential hospice. Pt remains on full comfort care measures and on IV morphine infusion.  Pt appears comfortable and no respiratory distress.   10/3:  Pt remains on full comfort care measures and appears comfortable on IV morphine infusion.    Time spent: 25 mins   Murvin Natal  MD  How to contact the [facility] Attending or Consulting provider [place] or covering provider during after hours [place], for this patient?  1. Check the care team in [facility] and look for a) attending/consulting [facility] provider listed and b) the [facility] team listed 2. Log into [email or web address] and use [place]'s universal password to access. If you do not have the password, please contact the hospital operator. 3. Locate the [facility] provider you are looking for under [facility] and page to a number that you can be directly reached. 4. If you still have difficulty reaching the provider,  please page the [facility] (Director on Call) for the Hospitalists listed on amion for assistance.

## 2020-07-01 DEATH — deceased
